# Patient Record
Sex: Male | Born: 1965 | Race: White | Hispanic: No | Marital: Married | Smoking: Never smoker
Health system: Southern US, Community
[De-identification: ages and names within clinical notes are randomized; demographics above are authoritative.]

## PROBLEM LIST (undated history)

## (undated) DIAGNOSIS — C8590 Non-Hodgkin lymphoma, unspecified, unspecified site: Secondary | ICD-10-CM

## (undated) DIAGNOSIS — M899 Disorder of bone, unspecified: Secondary | ICD-10-CM

## (undated) DIAGNOSIS — C801 Malignant (primary) neoplasm, unspecified: Secondary | ICD-10-CM

## (undated) DIAGNOSIS — C8190 Hodgkin lymphoma, unspecified, unspecified site: Secondary | ICD-10-CM

## (undated) DIAGNOSIS — Z973 Presence of spectacles and contact lenses: Secondary | ICD-10-CM

## (undated) DIAGNOSIS — E785 Hyperlipidemia, unspecified: Secondary | ICD-10-CM

## (undated) DIAGNOSIS — K219 Gastro-esophageal reflux disease without esophagitis: Secondary | ICD-10-CM

## (undated) DIAGNOSIS — E7800 Pure hypercholesterolemia, unspecified: Secondary | ICD-10-CM

## (undated) DIAGNOSIS — K469 Unspecified abdominal hernia without obstruction or gangrene: Secondary | ICD-10-CM

## (undated) DIAGNOSIS — M4306 Spondylolysis, lumbar region: Secondary | ICD-10-CM

## (undated) HISTORY — DX: Hyperlipidemia, unspecified: E78.5

## (undated) HISTORY — DX: Pure hypercholesterolemia, unspecified: E78.00

## (undated) HISTORY — DX: Disorder of bone, unspecified: M89.9

## (undated) HISTORY — PX: CLOSED REDUCTION HAND FRACTURE: SHX973

## (undated) HISTORY — DX: Unspecified abdominal hernia without obstruction or gangrene: K46.9

## (undated) HISTORY — DX: Gastro-esophageal reflux disease without esophagitis: K21.9

## (undated) HISTORY — PX: FINGER FRACTURE SURGERY: SHX638

## (undated) HISTORY — DX: Hodgkin lymphoma, unspecified, unspecified site: C81.90

## (undated) HISTORY — DX: Spondylolysis, lumbar region: M43.06

## (undated) HISTORY — DX: Malignant (primary) neoplasm, unspecified: C80.1

---

## 1998-04-16 ENCOUNTER — Encounter: Admission: RE | Admit: 1998-04-16 | Discharge: 1998-07-15 | Payer: Self-pay | Admitting: Internal Medicine

## 2011-04-17 ENCOUNTER — Ambulatory Visit (INDEPENDENT_AMBULATORY_CARE_PROVIDER_SITE_OTHER): Payer: 59 | Admitting: General Surgery

## 2011-04-17 ENCOUNTER — Encounter (INDEPENDENT_AMBULATORY_CARE_PROVIDER_SITE_OTHER): Payer: Self-pay | Admitting: General Surgery

## 2011-04-17 VITALS — BP 120/74 | HR 60 | Temp 96.4°F | Ht 70.0 in | Wt 205.2 lb

## 2011-04-17 DIAGNOSIS — K4090 Unilateral inguinal hernia, without obstruction or gangrene, not specified as recurrent: Secondary | ICD-10-CM

## 2011-04-17 NOTE — Progress Notes (Signed)
Subjective:     Patient ID: Keith Forbes, male   DOB: August 04, 1966, 45 y.o.   MRN: [id number]  HPI Comments: Mr. Rote is a 45 year old male referred by Dr.  Modesto Charon because of a right inguinal hernia. This has been present for one to 2 years. It is basically asymptomatic. He does heavy exercise without any difficulty. No difficulty with urination or bowel movements. He is here to discuss possible repair.    Review of Systems General:  _X_Normal __Fever__Weight change __Night sweats __Fatigue __Sleep loss  Breast:  _X_Normal __Lump __Pain __Nipple discharge __Infection __Other  Infectious Diseases:  _X_Normal __HIV/AIDS __Tuberculosis __Hepatitis A       __ Hepatitis B __Hepatitis C __ STD __MRSA(staph infection) __Other  Dental:  _X_Normal __Dentures __Other  Cardiac  :_X_ Normal __Pacemaker __Defibrillator __Bypass surgery __High blood pressure __ Peripheral vascular disease __Heart attack __Irregular heart beat __Valve       disease  Pulmonary:  _X_Normal __Cough/Sputum __Bronchitis __Asthma __COPD                    __Short of breath __Pneumonia __Sleep Apnea  Endocrine:  _X_Normal __Diabetes __Thyroid disease __High Cholesterol __Other  Skin:  _X_Normal  __Rash __Bruise easily __Cancer __Abnormal moles __Other  Gastrointestinal:  _X_Normal __Nausea/Vomiting/Diarrhea __Colon polyp or Cancer       __Irritable Bowel disease __Poor appetite __Hiatal hernia or reflux __Ulcer       __Liver disease __Abdominal pain __Hernia __Rectal bleeding or hemorrhoids       __Other  Genitourinary:  _X_Normal __Kidney disease or stones __Prostate problems        __Blood in urine __Difficulty voiding __Incontinence/leakage __Other  Neurological:  _X_Normal __Stroke __Paralysis __Seizure __Alzheimers disease       __Headaches __Dizziness __Fainting __Weakness __Other  Hematologic/Lymphatic:  _X_Normal __Bleeding disorder __Blood clots __Anemia       __Swollen lymph nodes __Blood Transfusion  __Other  Immune System:  _X_Normal __Previous/Current Cancer-type:  __Other  HEENT:  _X_Normal __Hearing loss  __Hearing aid __Ear infection __Nose bleed       __Hoarseness __Sore throat __Blurred or double vision __Glasses or contacts       __Glaucoma __Retinopathy __Macular degeneration __Other  Musculoskeletal:  _X_Normal __Joint pain __Arthritis __Other           Objective:   Physical Exam  Constitutional: He appears well-developed and well-nourished. No distress.  Abdominal: Soft. Bowel sounds are normal. He exhibits no distension and no mass. There is no tenderness.  Genitourinary: Penis normal.       Small right inguinal bulge.  No left inguinal bulge.  Testicles normal.  Musculoskeletal: Normal range of motion. He exhibits no edema.       Assessment:     Small right inguinal hernia that is asymptomatic.    Plan:     I told him and eventually he will need to have this repaired and I did not think it needed to be repaired acutely. If it becomes larger or he started having symptoms he should come back and we will schedule repair. He seems to understand all this.

## 2011-04-17 NOTE — Patient Instructions (Signed)
If the hernia becomes more painful or it gets larger call back and arrange to come in and talk about repair.

## 2011-05-01 ENCOUNTER — Encounter: Payer: Self-pay | Admitting: Family Medicine

## 2011-05-01 DIAGNOSIS — E7800 Pure hypercholesterolemia, unspecified: Secondary | ICD-10-CM | POA: Insufficient documentation

## 2012-08-12 ENCOUNTER — Other Ambulatory Visit: Payer: Self-pay | Admitting: Family Medicine

## 2012-08-12 ENCOUNTER — Ambulatory Visit
Admission: RE | Admit: 2012-08-12 | Discharge: 2012-08-12 | Disposition: A | Discharge status: Home / Self Care | Payer: 59 | Source: Ambulatory Visit | Attending: Family Medicine | Admitting: Family Medicine

## 2012-08-12 DIAGNOSIS — M545 Low back pain, unspecified: Secondary | ICD-10-CM

## 2013-01-28 ENCOUNTER — Other Ambulatory Visit: Payer: Self-pay | Admitting: Family Medicine

## 2013-01-28 NOTE — Telephone Encounter (Signed)
Medication refilled per protocol.Patient needs to be seen before any further refills 

## 2013-02-04 ENCOUNTER — Ambulatory Visit (INDEPENDENT_AMBULATORY_CARE_PROVIDER_SITE_OTHER): Payer: 59 | Admitting: Family Medicine

## 2013-02-04 ENCOUNTER — Encounter: Payer: Self-pay | Admitting: Family Medicine

## 2013-02-04 VITALS — BP 120/78 | HR 76 | Temp 98.7°F | Resp 16 | Ht 70.0 in | Wt 208.0 lb

## 2013-02-04 DIAGNOSIS — M5432 Sciatica, left side: Secondary | ICD-10-CM

## 2013-02-04 DIAGNOSIS — M545 Low back pain, unspecified: Secondary | ICD-10-CM

## 2013-02-04 DIAGNOSIS — M7062 Trochanteric bursitis, left hip: Secondary | ICD-10-CM

## 2013-02-04 DIAGNOSIS — M5430 Sciatica, unspecified side: Secondary | ICD-10-CM

## 2013-02-04 DIAGNOSIS — M76899 Other specified enthesopathies of unspecified lower limb, excluding foot: Secondary | ICD-10-CM

## 2013-02-04 NOTE — Progress Notes (Signed)
Subjective:    Patient ID: Keith Forbes, male    DOB: 03-04-1966, 47 y.o.   MRN: [id number]  HPI  Patient reports 6 weeks of pain in his left hip. The pain is located over the greater trochanteric bursa. Her sling on his hip at night. Aches and throbs. He is tender to the touch of the lateral aspect of the superior hip.  He denies specific injury. He gradually began after he was swimming.    However he also continues to have pain in his lower back. X-ray obtained in November 2013 revealed bilateral pars defects at L5. Also has some slightly diminished disc space at L5-S1.  However over the last 6 months, he has daily neuropathic-like pain radiating down the lateral aspect of his left leg into his anterior left she had. He denies weakness in the legs. Denies numbness. He does report pins and needles dysesthesias of the skin.   Past Medical History  Diagnosis Date  . Hernia   . Hyperlipidemia   . Hypercholesteremia    Current Outpatient Prescriptions on File Prior to Visit  Medication Sig Dispense Refill  . Cyanocobalamin (B-12 PO) Take by mouth daily.        . Multiple Vitamin (MULTIVITAMIN PO) Take by mouth daily.        . Omega-3 Fatty Acids (FISH OIL PO) Take by mouth 3 (three) times daily.        . pravastatin (PRAVACHOL) 20 MG tablet TAKE 1 TABLET BY MOUTH AT BEDTIME  30 tablet  1   No current facility-administered medications on file prior to visit.   No Known Allergies History   Social History  . Marital Status: Married    Spouse Name: N/A    Number of Children: N/A  . Years of Education: N/A   Occupational History  . Not on file.   Social History Main Topics  . Smoking status: Never Smoker   . Smokeless tobacco: Not on file  . Alcohol Use: Yes  . Drug Use: No  . Sexually Active:    Other Topics Concern  . Not on file   Social History Narrative  . No narrative on file     Review of Systems  All other systems reviewed and are negative.       Objective:    Physical Exam  Vitals reviewed. Cardiovascular: Normal rate, regular rhythm and normal heart sounds.   No murmur heard. Pulmonary/Chest: Effort normal and breath sounds normal.  Musculoskeletal:       Left hip: He exhibits tenderness (over greater trocanter) and bony tenderness. He exhibits normal range of motion, normal strength, no crepitus and no deformity.   muscle strength is 5 over 5 equal and symmetric in both legs. He has normal reflexes two over four at the knee and at the ankle. He has negative straight leg raise.         Assessment & Plan:  1. Greater trochanteric bursitis of left hip Using sterile technique after obtaining informed consent, the greater trochanteric bursa was injected with a mixture of 1 cc of 40 mg per mL Kenalog and 1 cc of 0.1% lidocaine without epinephrine. Wound care was discussed and the patient will follow up in 2 weeks if no better here if worse the  2. Low back pain Likely due to the bilateral pars defects. Obtain MRI to evaluate for the cause of his sciatica. - MR Lumbar Spine Wo Contrast; Future  3. Left sided sciatica Check MRI. Patient  is interested in epidural steroid injections if there is a cause for his sciatica revealed. - MR Lumbar Spine Wo Contrast; Future

## 2013-02-17 ENCOUNTER — Ambulatory Visit
Admission: RE | Admit: 2013-02-17 | Discharge: 2013-02-17 | Disposition: A | Discharge status: Home / Self Care | Payer: 59 | Source: Ambulatory Visit | Attending: Family Medicine | Admitting: Family Medicine

## 2013-02-17 ENCOUNTER — Other Ambulatory Visit: Payer: Self-pay | Admitting: Family Medicine

## 2013-02-17 DIAGNOSIS — Z9889 Other specified postprocedural states: Secondary | ICD-10-CM

## 2013-02-17 DIAGNOSIS — M545 Low back pain, unspecified: Secondary | ICD-10-CM

## 2013-02-17 DIAGNOSIS — M5432 Sciatica, left side: Secondary | ICD-10-CM

## 2013-02-18 ENCOUNTER — Other Ambulatory Visit: Payer: Self-pay | Admitting: Family Medicine

## 2013-02-18 DIAGNOSIS — M4306 Spondylolysis, lumbar region: Secondary | ICD-10-CM

## 2013-03-27 ENCOUNTER — Ambulatory Visit (INDEPENDENT_AMBULATORY_CARE_PROVIDER_SITE_OTHER): Payer: 59 | Admitting: Family Medicine

## 2013-03-27 ENCOUNTER — Encounter: Payer: Self-pay | Admitting: Family Medicine

## 2013-03-27 VITALS — BP 120/70 | HR 74 | Temp 99.4°F | Resp 16 | Wt 204.0 lb

## 2013-03-27 DIAGNOSIS — M4306 Spondylolysis, lumbar region: Secondary | ICD-10-CM

## 2013-03-27 DIAGNOSIS — M5430 Sciatica, unspecified side: Secondary | ICD-10-CM

## 2013-03-27 DIAGNOSIS — M4310 Spondylolisthesis, site unspecified: Secondary | ICD-10-CM

## 2013-03-27 DIAGNOSIS — M5432 Sciatica, left side: Secondary | ICD-10-CM

## 2013-03-27 MED ORDER — CELECOXIB 200 MG PO CAPS: 200.0000 mg | ORAL_CAPSULE | Freq: Every day | ORAL | Status: DC

## 2013-03-27 NOTE — Progress Notes (Signed)
  Subjective:    Patient ID: Keith Forbes, male    DOB: 1966/02/03, 47 y.o.   MRN: [id number]  HPI Patient in dealing with low back pain off and on for about a year. We did obtain an MRI of lumbar spine on 02/17/2013 which revealed bilateral pars defects at L5 causing a 2 mm anterior listhesis of L5 on S1. This cause bilateral foraminal encroachment left greater than right. The patient continues to have low back pain with left neuropathic pain radiating into his left hip down the posterior aspect of his left leg and into his left thigh. It is a daily problem. It did improve with prednisone. He is taking ibuprofen on a daily basis with minimal relief. He is tried physical therapy and I see no benefit from physical therapy. At this point he is interested in more aggressive options. Past Medical History  Diagnosis Date  . Hernia   . Hyperlipidemia   . Hypercholesteremia    Current Outpatient Prescriptions on File Prior to Visit  Medication Sig Dispense Refill  . Cyanocobalamin (B-12 PO) Take by mouth daily.        . Multiple Vitamin (MULTIVITAMIN PO) Take by mouth daily.        . Omega-3 Fatty Acids (FISH OIL PO) Take by mouth 3 (three) times daily.        . pravastatin (PRAVACHOL) 20 MG tablet TAKE 1 TABLET BY MOUTH AT BEDTIME  30 tablet  1   No current facility-administered medications on file prior to visit.   No Known Allergies History   Social History  . Marital Status: Married    Spouse Name: N/A    Number of Children: N/A  . Years of Education: N/A   Occupational History  . Not on file.   Social History Main Topics  . Smoking status: Never Smoker   . Smokeless tobacco: Not on file  . Alcohol Use: Yes  . Drug Use: No  . Sexually Active:    Other Topics Concern  . Not on file   Social History Narrative  . No narrative on file   Family History  Problem Relation Age of Onset  . Hyperlipidemia Father       Review of Systems  All other systems reviewed and are  negative.       Objective:   Physical Exam  Vitals reviewed. Cardiovascular: Normal rate and regular rhythm.   Pulmonary/Chest: Effort normal and breath sounds normal.  majority of office visit was spent in discussion.        Assessment & Plan:  1. Pars defect of lumbar spine - Ambulatory referral to Neurosurgery  2. Left sided sciatica - Ambulatory referral to Neurosurgery We have tried and exhausted conservative options. The patient like to speak back surgery regarding other possible treatments for his low back pain and sciatica. In the meantime he's going to try Celebrex 200 mg by mouth daily for relief.  Consult neurosurgery.  We'll defer treatment of his greater trochanteric bursitis until after evaluation of his back.

## 2013-05-06 ENCOUNTER — Encounter: Payer: Self-pay | Admitting: Family Medicine

## 2013-05-06 ENCOUNTER — Other Ambulatory Visit: Payer: Self-pay | Admitting: Family Medicine

## 2013-05-06 NOTE — Telephone Encounter (Signed)
Medication refill for one time only.  Patient needs to be seen.  Letter sent for patient to call and schedule 

## 2013-06-09 ENCOUNTER — Other Ambulatory Visit: Payer: Self-pay | Admitting: Family Medicine

## 2013-09-28 ENCOUNTER — Other Ambulatory Visit: Payer: Self-pay | Admitting: Family Medicine

## 2013-11-17 ENCOUNTER — Encounter: Payer: Self-pay | Admitting: Family Medicine

## 2013-11-17 ENCOUNTER — Ambulatory Visit (INDEPENDENT_AMBULATORY_CARE_PROVIDER_SITE_OTHER): Payer: 59 | Admitting: Family Medicine

## 2013-11-17 VITALS — BP 120/70 | HR 64 | Temp 97.1°F | Resp 14 | Ht 70.0 in | Wt 202.0 lb

## 2013-11-17 DIAGNOSIS — R053 Chronic cough: Secondary | ICD-10-CM

## 2013-11-17 DIAGNOSIS — R059 Cough, unspecified: Secondary | ICD-10-CM

## 2013-11-17 DIAGNOSIS — R05 Cough: Secondary | ICD-10-CM

## 2013-11-17 MED ORDER — OMEPRAZOLE 40 MG PO CPDR
40.0000 mg | DELAYED_RELEASE_CAPSULE | Freq: Every day | ORAL | Status: DC
Start: 2013-11-17 — End: 2014-09-10

## 2013-11-17 NOTE — Progress Notes (Signed)
   Subjective:    Patient ID: Keith Forbes, male    DOB: 12/05/1965, 48 y.o.   MRN: [id number]  HPI Patient has had a chronic nonproductive cough for the last 4 months. It is becoming go. He denies any fever, night sweats, chills, weight loss, or hemoptysis. He denies any chest pain or shortness of breath. It does look after lunch or after dinner and night. It is more of an irritant cough. Whenever he takes a deep breath in he was delayed in the back of his throat that triggers the cough. Otherwise he is feeling well. He denies any postnasal drip or allergies. He denies any acid reflux. Past Medical History  Diagnosis Date  . Hernia   . Hyperlipidemia   . Hypercholesteremia    Current Outpatient Prescriptions on File Prior to Visit  Medication Sig Dispense Refill  . CELEBREX 200 MG capsule TAKE 1 CAPSULE (200 MG TOTAL) BY MOUTH DAILY.  30 capsule  5  . Cyanocobalamin (B-12 PO) Take by mouth daily.        . Multiple Vitamin (MULTIVITAMIN PO) Take by mouth daily.        . Omega-3 Fatty Acids (FISH OIL PO) Take by mouth 3 (three) times daily.        . pravastatin (PRAVACHOL) 20 MG tablet TAKE 1 TABLET BY MOUTH AT BEDTIME  30 tablet  5   No current facility-administered medications on file prior to visit.   No Known Allergies History   Social History  . Marital Status: Married    Spouse Name: N/A    Number of Children: N/A  . Years of Education: N/A   Occupational History  . Not on file.   Social History Main Topics  . Smoking status: Never Smoker   . Smokeless tobacco: Not on file  . Alcohol Use: Yes  . Drug Use: No  . Sexual Activity:    Other Topics Concern  . Not on file   Social History Narrative  . No narrative on file      Review of Systems  All other systems reviewed and are negative.       Objective:   Physical Exam  Vitals reviewed. Constitutional: He appears well-developed and well-nourished.  HENT:  Nose: Nose normal.  Mouth/Throat: Oropharynx is  clear and moist. No oropharyngeal exudate.  Eyes: Conjunctivae are normal. Pupils are equal, round, and reactive to light.  Neck: Neck supple. No JVD present. No thyromegaly present.  Cardiovascular: Normal rate, regular rhythm and normal heart sounds.   No murmur heard. Pulmonary/Chest: Effort normal and breath sounds normal. No respiratory distress. He has no wheezes. He has no rales. He exhibits no tenderness.  Abdominal: Soft. Bowel sounds are normal. He exhibits no distension. There is no tenderness. There is no rebound.  Lymphadenopathy:    He has no cervical adenopathy.          Assessment & Plan:  1. 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 Begin omeprazole 40 mg by mouth daily and recheck in 3 weeks. If cough persist I would obtain a chest x-ray and check lab work to evaluate for whooping cough as sarcoidosis. - omeprazole (PRILOSEC) 40 MG capsule; Take 1 capsule (40 mg total) by mouth daily.  Dispense: 30 capsule; Refill: 3

## 2013-12-17 ENCOUNTER — Other Ambulatory Visit: Payer: Self-pay | Admitting: Family Medicine

## 2013-12-17 DIAGNOSIS — E785 Hyperlipidemia, unspecified: Secondary | ICD-10-CM

## 2013-12-17 DIAGNOSIS — Z79899 Other long term (current) drug therapy: Secondary | ICD-10-CM

## 2013-12-17 DIAGNOSIS — Z0000 Encounter for general adult medical examination without abnormal findings: Secondary | ICD-10-CM

## 2013-12-19 LAB — CBC WITH DIFFERENTIAL/PLATELET
BASOS ABS: 0.1 10*3/uL (ref 0.0–0.1)
Basophils Relative: 1 % (ref 0–1)
Eosinophils Absolute: 0.3 10*3/uL (ref 0.0–0.7)
Eosinophils Relative: 2 % (ref 0–5)
HEMATOCRIT: 41.1 % (ref 39.0–52.0)
Hemoglobin: 13.9 g/dL (ref 13.0–17.0)
LYMPHS PCT: 19 % (ref 12–46)
Lymphs Abs: 2.5 10*3/uL (ref 0.7–4.0)
MCH: 29 pg (ref 26.0–34.0)
MCHC: 33.8 g/dL (ref 30.0–36.0)
MCV: 85.6 fL (ref 78.0–100.0)
Monocytes Absolute: 0.8 10*3/uL (ref 0.1–1.0)
Monocytes Relative: 6 % (ref 3–12)
NEUTROS ABS: 9.3 10*3/uL — AB (ref 1.7–7.7)
Neutrophils Relative %: 72 % (ref 43–77)
PLATELETS: 278 10*3/uL (ref 150–400)
RBC: 4.8 MIL/uL (ref 4.22–5.81)
RDW: 13.1 % (ref 11.5–15.5)
WBC: 12.9 10*3/uL — AB (ref 4.0–10.5)

## 2013-12-19 LAB — COMPREHENSIVE METABOLIC PANEL
ALBUMIN: 4 g/dL (ref 3.5–5.2)
ALT: 34 U/L (ref 0–53)
AST: 35 U/L (ref 0–37)
Alkaline Phosphatase: 101 U/L (ref 39–117)
BUN: 14 mg/dL (ref 6–23)
CHLORIDE: 104 meq/L (ref 96–112)
CO2: 29 meq/L (ref 19–32)
Calcium: 10.3 mg/dL (ref 8.4–10.5)
Creat: 0.96 mg/dL (ref 0.50–1.35)
Glucose, Bld: 86 mg/dL (ref 70–99)
POTASSIUM: 4.9 meq/L (ref 3.5–5.3)
Sodium: 139 mEq/L (ref 135–145)
Total Bilirubin: 0.5 mg/dL (ref 0.2–1.2)
Total Protein: 7.3 g/dL (ref 6.0–8.3)

## 2013-12-19 LAB — TSH: TSH: 0.248 u[IU]/mL — ABNORMAL LOW (ref 0.350–4.500)

## 2013-12-19 LAB — LIPID PANEL
CHOLESTEROL: 193 mg/dL (ref 0–200)
HDL: 37 mg/dL — AB (ref >39)
LDL CALC: 142 mg/dL — AB (ref 0–99)
Total CHOL/HDL Ratio: 5.2 Ratio
Triglycerides: 69 mg/dL (ref <150)
VLDL: 14 mg/dL (ref 0–40)

## 2013-12-22 ENCOUNTER — Ambulatory Visit (INDEPENDENT_AMBULATORY_CARE_PROVIDER_SITE_OTHER): Payer: 59 | Admitting: Family Medicine

## 2013-12-22 ENCOUNTER — Encounter: Payer: Self-pay | Admitting: Family Medicine

## 2013-12-22 VITALS — BP 120/60 | HR 60 | Temp 98.4°F | Resp 14 | Ht 70.0 in

## 2013-12-22 DIAGNOSIS — R946 Abnormal results of thyroid function studies: Secondary | ICD-10-CM

## 2013-12-22 DIAGNOSIS — R7989 Other specified abnormal findings of blood chemistry: Secondary | ICD-10-CM

## 2013-12-22 DIAGNOSIS — R059 Cough, unspecified: Secondary | ICD-10-CM

## 2013-12-22 DIAGNOSIS — R053 Chronic cough: Secondary | ICD-10-CM

## 2013-12-22 DIAGNOSIS — R05 Cough: Secondary | ICD-10-CM

## 2013-12-22 DIAGNOSIS — Z0000 Encounter for general adult medical examination without abnormal findings: Secondary | ICD-10-CM

## 2013-12-22 DIAGNOSIS — M4306 Spondylolysis, lumbar region: Secondary | ICD-10-CM | POA: Insufficient documentation

## 2013-12-22 DIAGNOSIS — D72829 Elevated white blood cell count, unspecified: Secondary | ICD-10-CM

## 2013-12-22 NOTE — Progress Notes (Signed)
Subjective:    Patient ID: Keith Forbes, male    DOB: [birth year], 48 y.o.   MRN: [id number]  HPI I last saw the patient 11/17/13.  At that time he reported a chronic nonproductive cough for the last 4 months. It is becoming go. He denies any fever, night sweats, chills, weight loss, or hemoptysis. He denies any chest pain or shortness of breath. It does look after lunch or after dinner and night. It is more of an irritant cough. Whenever he takes a deep breath in he was delayed in the back of his throat that triggers the cough. Otherwise he is feeling well. He denies any postnasal drip or allergies. He denies any acid reflux.  My plan was: 1. 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 Begin omeprazole 40 mg by mouth daily and recheck in 3 weeks. If cough persist I would obtain a chest x-ray and check lab work to evaluate for whooping cough as sarcoidosis. - omeprazole (PRILOSEC) 40 MG capsule; Take 1 capsule (40 mg total) by mouth daily.  Dispense: 30 capsule; Refill: 3  Patient's cough improved and almost subsided however he then got sick and developed a flulike illness associated with fever. He continues to have an occasional cough today although the fever has subsided. That may explain why his white blood cell count is elevated on his fasting lab work which is listed below. The patient's LDL cholesterol is slightly elevated the patient admits that he only takes pravastatin 3-4 days out of 7. Orders Only on 12/17/2013  Component Date Value Ref Range Status  . WBC 12/18/2013 12.9* 4.0 - 10.5 K/uL Final  . RBC 12/18/2013 4.80  4.22 - 5.81 MIL/uL Final  . Hemoglobin 12/18/2013 13.9  13.0 - 17.0 g/dL Final  . HCT 12/18/2013 41.1  39.0 - 52.0 % Final  . MCV 12/18/2013 85.6  78.0 - 100.0 fL Final  . MCH 12/18/2013 29.0  26.0 - 34.0 pg Final  . MCHC 12/18/2013 33.8  30.0 - 36.0 g/dL Final  . RDW 12/18/2013 13.1  11.5 - 15.5 % Final  . Platelets 12/18/2013 278  150 - 400  K/uL Final  . Neutrophils Relative % 12/18/2013 72  43 - 77 % Final  . Neutro Abs 12/18/2013 9.3* 1.7 - 7.7 K/uL Final  . Lymphocytes Relative 12/18/2013 19  12 - 46 % Final  . Lymphs Abs 12/18/2013 2.5  0.7 - 4.0 K/uL Final  . Monocytes Relative 12/18/2013 6  3 - 12 % Final  . Monocytes Absolute 12/18/2013 0.8  0.1 - 1.0 K/uL Final  . Eosinophils Relative 12/18/2013 2  0 - 5 % Final  . Eosinophils Absolute 12/18/2013 0.3  0.0 - 0.7 K/uL Final  . Basophils Relative 12/18/2013 1  0 - 1 % Final  . Basophils Absolute 12/18/2013 0.1  0.0 - 0.1 K/uL Final  . Smear Review 12/18/2013 Criteria for review not met   Final  . Cholesterol 12/18/2013 193  0 - 200 mg/dL Final   Comment: ATP III Classification:                                < 200        mg/dL        Desirable  200 - 239     mg/dL        Borderline High                               >= 240        mg/dL        High                             . Triglycerides 12/18/2013 69  <150 mg/dL Final  . HDL 12/18/2013 37* >39 mg/dL Final  . Total CHOL/HDL Ratio 12/18/2013 5.2   Final  . VLDL 12/18/2013 14  0 - 40 mg/dL Final  . LDL Cholesterol 12/18/2013 142* 0 - 99 mg/dL Final   Comment:                            Total Cholesterol/HDL Ratio:CHD Risk                                                 Coronary Heart Disease Risk Table                                                                 Men       Women                                   1/2 Average Risk              3.4        3.3                                       Average Risk              5.0        4.4                                    2X Average Risk              9.6        7.1                                    3X Average Risk             23.4       11.0                          Use the calculated Patient Ratio above and the CHD Risk table  to determine the patient's CHD Risk.                          ATP III Classification  (LDL):                                < 100        mg/dL         Optimal                               100 - 129     mg/dL         Near or Above Optimal                               130 - 159     mg/dL         Borderline High                               160 - 189     mg/dL         High                                > 190        mg/dL         Very High                             . TSH 12/18/2013 0.248* 0.350 - 4.500 uIU/mL Final  . Sodium 12/18/2013 139  135 - 145 mEq/L Final  . Potassium 12/18/2013 4.9  3.5 - 5.3 mEq/L Final  . Chloride 12/18/2013 104  96 - 112 mEq/L Final  . CO2 12/18/2013 29  19 - 32 mEq/L Final  . Glucose, Bld 12/18/2013 86  70 - 99 mg/dL Final  . BUN 12/18/2013 14  6 - 23 mg/dL Final  . Creat 12/18/2013 0.96  0.50 - 1.35 mg/dL Final  . Total Bilirubin 12/18/2013 0.5  0.2 - 1.2 mg/dL Final  . Alkaline Phosphatase 12/18/2013 101  39 - 117 U/L Final  . AST 12/18/2013 35  0 - 37 U/L Final  . ALT 12/18/2013 34  0 - 53 U/L Final  . Total Protein 12/18/2013 7.3  6.0 - 8.3 g/dL Final  . Albumin 12/18/2013 4.0  3.5 - 5.2 g/dL Final  . Calcium 12/18/2013 10.3  8.4 - 10.5 mg/dL Final   Past Medical History  Diagnosis Date  . Hernia   . Hyperlipidemia   . Hypercholesteremia   . Pars defect of lumbar spine     bilateral 2013 (Dr. Sherwood Gambler)   No past surgical history on file. Current Outpatient Prescriptions on File Prior to Visit  Medication Sig Dispense Refill  . aspirin 81 MG tablet Take 81 mg by mouth daily.      . CELEBREX 200 MG capsule TAKE 1 CAPSULE (200 MG TOTAL) BY MOUTH DAILY.  30 capsule  5  . Cyanocobalamin (B-12 PO) Take by mouth daily.        . Multiple Vitamin (MULTIVITAMIN PO) Take by mouth daily.        Marland Kitchen  Omega-3 Fatty Acids (FISH OIL PO) Take by mouth 3 (three) times daily.        Marland Kitchen omeprazole (PRILOSEC) 40 MG capsule Take 1 capsule (40 mg total) by mouth daily.  30 capsule  3  . pravastatin (PRAVACHOL) 20 MG tablet TAKE 1 TABLET BY MOUTH AT  BEDTIME  30 tablet  5   No current facility-administered medications on file prior to visit.   No Known Allergies_0 History   Social History  . Marital Status: Married    Spouse Name: N/A    Number of Children: N/A  . Years of Education: N/A   Occupational History  . Not on file.   Social History Main Topics  . Smoking status: Never Smoker   . Smokeless tobacco: Never Used  . Alcohol Use: Yes  . Drug Use: No  . Sexual Activity: Not on file     Comment: married, works in emergency services   Other Topics Concern  . Not on file   Social History Narrative  . No narrative on file   Family History  Problem Relation Age of Onset  . Hyperlipidemia Father   . Heart disease Father        Review of Systems  All other systems reviewed and are negative.       Objective:   Physical Exam  Vitals reviewed. Constitutional: He is oriented to person, place, and time. He appears well-developed and well-nourished. No distress.  HENT:  Head: Normocephalic and atraumatic.  Right Ear: External ear normal.  Left Ear: External ear normal.  Nose: Nose normal.  Mouth/Throat: Oropharynx is clear and moist. No oropharyngeal exudate.  Eyes: Conjunctivae and EOM are normal. Pupils are equal, round, and reactive to light. Right eye exhibits no discharge. Left eye exhibits no discharge. No scleral icterus.  Neck: Normal range of motion. Neck supple. No JVD present. No tracheal deviation present. No thyromegaly present.  Cardiovascular: Normal rate, regular rhythm, normal heart sounds and intact distal pulses.  Exam reveals no gallop and no friction rub.   No murmur heard. Pulmonary/Chest: Effort normal and breath sounds normal. No stridor. No respiratory distress. He has no wheezes. He has no rales. He exhibits no tenderness.  Abdominal: Soft. Bowel sounds are normal. He exhibits no distension and no mass. There is no tenderness. There is no rebound and no guarding.  Genitourinary: Penis  normal.  Musculoskeletal: Normal range of motion. He exhibits no edema and no tenderness.  Lymphadenopathy:    He has no cervical adenopathy.  Neurological: He is alert and oriented to person, place, and time. He has normal reflexes. He displays normal reflexes. No cranial nerve deficit. He exhibits normal muscle tone. Coordination normal.  Skin: Skin is warm. No rash noted. He is not diaphoretic. No erythema. No pallor.  Psychiatric: He has a normal mood and affect. His behavior is normal. Judgment and thought content normal.          Assessment & Plan:  1. Chronic cough I still believe the patient has laryngo-esophageal reflux. Discontinue Prilosec and switch to dexilant 60 mg poqday. The patient chest x-ray. Recheck in 2-3 weeks - DG Chest 2 View; Future  2. Routine general medical examination at a health care facility Patient's physical exam is completely normal. I recommended compliance with pravastatin. I believe that would drop his LDL below 130 as it is already dropped over 40 points taking it 50-60% of the time.  3. Low TSH level Admitting would stay due to his  illness. I recommended that he return in 2 weeks to allow [place] to recheck a TSH when he is not ill. - TSH; Future - T4, free; Future - T3, Free; Future  4. Elevated WBC count Recheck CBC when he is not ill - CBC with Differential; Future

## 2013-12-24 ENCOUNTER — Ambulatory Visit
Admission: RE | Admit: 2013-12-24 | Discharge: 2013-12-24 | Disposition: A | Discharge status: Home / Self Care | Payer: 59 | Source: Ambulatory Visit | Attending: Family Medicine | Admitting: Family Medicine

## 2013-12-24 ENCOUNTER — Other Ambulatory Visit: Payer: Self-pay | Admitting: Family Medicine

## 2013-12-24 DIAGNOSIS — R053 Chronic cough: Secondary | ICD-10-CM

## 2013-12-24 DIAGNOSIS — R05 Cough: Secondary | ICD-10-CM

## 2014-01-06 ENCOUNTER — Telehealth: Payer: Self-pay | Admitting: Family Medicine

## 2014-01-06 MED ORDER — DEXLANSOPRAZOLE 60 MG PO CPDR: 60.0000 mg | DELAYED_RELEASE_CAPSULE | Freq: Every day | ORAL | Status: DC

## 2014-01-06 NOTE — Telephone Encounter (Signed)
Med sent to pharm 

## 2014-01-06 NOTE — Telephone Encounter (Signed)
Patients wife cari calling to see if Keith Forbes can get rx for dexilant to be called into [facility]

## 2014-01-08 ENCOUNTER — Encounter: Payer: Self-pay | Admitting: *Deleted

## 2014-01-26 ENCOUNTER — Other Ambulatory Visit: Payer: 59

## 2014-01-26 DIAGNOSIS — R7989 Other specified abnormal findings of blood chemistry: Secondary | ICD-10-CM

## 2014-01-26 DIAGNOSIS — D72829 Elevated white blood cell count, unspecified: Secondary | ICD-10-CM

## 2014-01-26 LAB — T3, FREE: T3, Free: 2.8 pg/mL (ref 2.3–4.2)

## 2014-01-26 LAB — CBC WITH DIFFERENTIAL/PLATELET
BASOS ABS: 0.1 10*3/uL (ref 0.0–0.1)
BASOS PCT: 1 % (ref 0–1)
EOS PCT: 2 % (ref 0–5)
Eosinophils Absolute: 0.2 10*3/uL (ref 0.0–0.7)
HCT: 40.9 % (ref 39.0–52.0)
Hemoglobin: 13.6 g/dL (ref 13.0–17.0)
Lymphocytes Relative: 19 % (ref 12–46)
Lymphs Abs: 2.2 10*3/uL (ref 0.7–4.0)
MCH: 28.6 pg (ref 26.0–34.0)
MCHC: 33.3 g/dL (ref 30.0–36.0)
MCV: 86.1 fL (ref 78.0–100.0)
Monocytes Absolute: 0.6 10*3/uL (ref 0.1–1.0)
Monocytes Relative: 5 % (ref 3–12)
NEUTROS ABS: 8.5 10*3/uL — AB (ref 1.7–7.7)
Neutrophils Relative %: 73 % (ref 43–77)
Platelets: 280 10*3/uL (ref 150–400)
RBC: 4.75 MIL/uL (ref 4.22–5.81)
RDW: 13.1 % (ref 11.5–15.5)
WBC: 11.7 10*3/uL — AB (ref 4.0–10.5)

## 2014-01-26 LAB — TSH: TSH: 0.208 u[IU]/mL — ABNORMAL LOW (ref 0.350–4.500)

## 2014-01-26 LAB — T4, FREE: FREE T4: 1.17 ng/dL (ref 0.80–1.80)

## 2014-02-06 NOTE — Progress Notes (Signed)
LMTRC

## 2014-02-10 ENCOUNTER — Encounter: Payer: Self-pay | Admitting: Family Medicine

## 2014-03-23 ENCOUNTER — Other Ambulatory Visit: Payer: Self-pay | Admitting: Family Medicine

## 2014-03-26 ENCOUNTER — Other Ambulatory Visit: Payer: Self-pay | Admitting: Family Medicine

## 2014-03-26 NOTE — Telephone Encounter (Signed)
Refill appropriate and filled per protocol. 

## 2014-08-06 ENCOUNTER — Ambulatory Visit (INDEPENDENT_AMBULATORY_CARE_PROVIDER_SITE_OTHER): Payer: 59 | Admitting: Family Medicine

## 2014-08-06 ENCOUNTER — Encounter: Payer: Self-pay | Admitting: Family Medicine

## 2014-08-06 VITALS — BP 108/70 | HR 74 | Temp 98.6°F | Resp 18 | Ht 70.0 in | Wt 199.0 lb

## 2014-08-06 DIAGNOSIS — I889 Nonspecific lymphadenitis, unspecified: Secondary | ICD-10-CM

## 2014-08-06 LAB — URINALYSIS, ROUTINE W REFLEX MICROSCOPIC
BILIRUBIN URINE: NEGATIVE
Glucose, UA: NEGATIVE mg/dL
Hgb urine dipstick: NEGATIVE
Ketones, ur: NEGATIVE mg/dL
LEUKOCYTES UA: NEGATIVE
NITRITE: NEGATIVE
PROTEIN: NEGATIVE mg/dL
SPECIFIC GRAVITY, URINE: 1.01 (ref 1.005–1.030)
UROBILINOGEN UA: 0.2 mg/dL (ref 0.0–1.0)
pH: 5.5 (ref 5.0–8.0)

## 2014-08-06 MED ORDER — CIPROFLOXACIN HCL 500 MG PO TABS: 500.0000 mg | ORAL_TABLET | Freq: Two times a day (BID) | ORAL | Status: DC

## 2014-08-06 NOTE — Progress Notes (Signed)
Subjective:    Patient ID: Keith Forbes, male    DOB: 13-Jun-1966, 48 y.o.   MRN: [id number]  HPI Patient presents today with a swollen hard lymph node in the left inguinal canal. He noticed it today while taking a shower. He denies any recent skin infections on the legs. He denies any hematuria or dysuria. He denies any penile discharge. He denies any rashes on the penis or scrotum or legs. He denies any exposure to gonorrhea or chlamydia. He is in a stable monogamous relationship. There is no other lymphadenopathy in the inguinal canals on either side in his axilla, and anterior posterior cervical chains, or supraclavicular region. He does report night sweats but he denies any fevers or weight loss. He denies any bleeding or bruising. Lymph node is approximately 2 cm in size. Past Medical History  Diagnosis Date  . Hernia   . Hyperlipidemia   . Hypercholesteremia   . Pars defect of lumbar spine     bilateral 2013 (Dr. Sherwood Gambler)   No past surgical history on file. Current Outpatient Prescriptions on File Prior to Visit  Medication Sig Dispense Refill  . aspirin 81 MG tablet Take 81 mg by mouth daily.    . celecoxib (CELEBREX) 200 MG capsule TAKE 1 CAPSULE (200 MG TOTAL) BY MOUTH DAILY. 30 capsule 5  . Cyanocobalamin (B-12 PO) Take by mouth daily.      Marland Kitchen dexlansoprazole (DEXILANT) 60 MG capsule Take 1 capsule (60 mg total) by mouth daily. 30 capsule 11  . Multiple Vitamin (MULTIVITAMIN PO) Take by mouth daily.      . Omega-3 Fatty Acids (FISH OIL PO) Take by mouth 3 (three) times daily.      Marland Kitchen omeprazole (PRILOSEC) 40 MG capsule Take 1 capsule (40 mg total) by mouth daily. 30 capsule 3  . pravastatin (PRAVACHOL) 20 MG tablet TAKE 1 TABLET BY MOUTH AT BEDTIME 30 tablet 5   No current facility-administered medications on file prior to visit.   No Known Allergies History   Social History  . Marital Status: Married    Spouse Name: N/A    Number of Children: N/A  . Years of  Education: N/A   Occupational History  . Not on file.   Social History Main Topics  . Smoking status: Never Smoker   . Smokeless tobacco: Never Used  . Alcohol Use: Yes  . Drug Use: No  . Sexual Activity: Not on file     Comment: married, works in emergency services   Other Topics Concern  . Not on file   Social History Narrative      Review of Systems  All other systems reviewed and are negative.      Objective:   Physical Exam  Cardiovascular: Normal rate, regular rhythm and normal heart sounds.   Pulmonary/Chest: Effort normal and breath sounds normal. No respiratory distress. He has no wheezes. He has no rales.  Abdominal: Soft. Bowel sounds are normal.  Lymphadenopathy:       Head (right side): No submental, no submandibular, no tonsillar, no preauricular, no posterior auricular and no occipital adenopathy present.       Head (left side): No submental, no submandibular, no tonsillar, no preauricular, no posterior auricular and no occipital adenopathy present.    He has no cervical adenopathy.    He has no axillary adenopathy.       Right: No inguinal and no supraclavicular adenopathy present.       Left: Inguinal adenopathy  present. No supraclavicular adenopathy present.  Vitals reviewed.         Assessment & Plan:  Inguinal lymphadenitis - Plan: ciprofloxacin (CIPRO) 500 MG tablet, CBC with Differential, Urinalysis, Routine w reflex microscopic, US Pelvis Limited  Urinalysis today shows no infection of urinary tract infection. There is no apparent skin infection. The patient denies any exposure to sexually transmitted infections. I will try to treat the patient empirically with Cipro 500 mg by mouth twice a day for 10 days. I will also obtain an ultrasound of the lymph node to evaluate for any pathologic features. If the lymph node persists or if there are pathologic features found on the ultrasound, I would recommend a biopsy

## 2014-08-07 ENCOUNTER — Telehealth: Payer: Self-pay | Admitting: Family Medicine

## 2014-08-07 LAB — CBC WITH DIFFERENTIAL/PLATELET
BASOS ABS: 0 10*3/uL (ref 0.0–0.1)
BASOS PCT: 0 % (ref 0–1)
Eosinophils Absolute: 0.1 10*3/uL (ref 0.0–0.7)
Eosinophils Relative: 1 % (ref 0–5)
HEMATOCRIT: 35.5 % — AB (ref 39.0–52.0)
HEMOGLOBIN: 12 g/dL — AB (ref 13.0–17.0)
Lymphocytes Relative: 18 % (ref 12–46)
Lymphs Abs: 2.5 10*3/uL (ref 0.7–4.0)
MCH: 28 pg (ref 26.0–34.0)
MCHC: 33.8 g/dL (ref 30.0–36.0)
MCV: 82.8 fL (ref 78.0–100.0)
MONOS PCT: 7 % (ref 3–12)
Monocytes Absolute: 1 10*3/uL (ref 0.1–1.0)
NEUTROS PCT: 74 % (ref 43–77)
Neutro Abs: 10.3 10*3/uL — ABNORMAL HIGH (ref 1.7–7.7)
Platelets: 325 10*3/uL (ref 150–400)
RBC: 4.29 MIL/uL (ref 4.22–5.81)
RDW: 13.5 % (ref 11.5–15.5)
WBC: 13.9 10*3/uL — AB (ref 4.0–10.5)

## 2014-08-07 NOTE — Telephone Encounter (Signed)
Called pt back and informed him of his appt again

## 2014-08-07 NOTE — Telephone Encounter (Signed)
[phone]  Pt has called back wanting to speak to you

## 2014-08-10 ENCOUNTER — Ambulatory Visit
Admission: RE | Admit: 2014-08-10 | Discharge: 2014-08-10 | Disposition: A | Discharge status: Home / Self Care | Payer: 59 | Source: Ambulatory Visit | Attending: Family Medicine | Admitting: Family Medicine

## 2014-08-10 DIAGNOSIS — I889 Nonspecific lymphadenitis, unspecified: Secondary | ICD-10-CM

## 2014-08-11 ENCOUNTER — Telehealth: Payer: Self-pay | Admitting: Family Medicine

## 2014-08-11 NOTE — Telephone Encounter (Signed)
Patient aware of results via vm  

## 2014-08-11 NOTE — Telephone Encounter (Signed)
Patient is calling about the results of his ultrasound  [phone]

## 2014-08-18 ENCOUNTER — Telehealth: Payer: Self-pay | Admitting: Family Medicine

## 2014-08-18 DIAGNOSIS — I889 Nonspecific lymphadenitis, unspecified: Secondary | ICD-10-CM

## 2014-08-18 NOTE — Telephone Encounter (Signed)
Pt's wife called back and states that the knot is still there on this pt and would like to know what the next step is for this? Per [facility] note on ultrasound next step is general surgery for biopsy. Will put in referral.

## 2014-08-23 ENCOUNTER — Other Ambulatory Visit: Payer: Self-pay | Admitting: Family Medicine

## 2014-08-25 ENCOUNTER — Other Ambulatory Visit: Payer: Self-pay | Admitting: Family Medicine

## 2014-08-25 NOTE — Telephone Encounter (Signed)
Refill appropriate and filled per protocol. 

## 2014-09-01 HISTORY — PX: LYMPH NODE BIOPSY: SHX201

## 2014-09-04 ENCOUNTER — Other Ambulatory Visit (INDEPENDENT_AMBULATORY_CARE_PROVIDER_SITE_OTHER): Payer: Self-pay | Admitting: General Surgery

## 2014-09-04 ENCOUNTER — Ambulatory Visit: Payer: 59 | Admitting: Family Medicine

## 2014-09-09 ENCOUNTER — Telehealth (INDEPENDENT_AMBULATORY_CARE_PROVIDER_SITE_OTHER): Payer: Self-pay | Admitting: General Surgery

## 2014-09-09 NOTE — Telephone Encounter (Signed)
Lymph node histology discussed with Dr. Gari Crown.  Histology diagnostic for Hodgkin's  lymphoma.  Further testing is being done to subclassify it. I called Keith Forbes and discussed this with him.  I advised immediate referral to medical oncology.  He agrees with this and wants to do this as quickly as possible I advise referral to Dr. Rozetta Nunnery, who is specializing in Orchid and hematology. We will make that referral.   Edsel Petrin. Dalbert Batman, M.D., [facility] General and Minimally invasive Surgery Breast and Colorectal Surgery Office:   [phone] Pager:   [phone]

## 2014-09-10 ENCOUNTER — Encounter: Payer: Self-pay | Admitting: Family Medicine

## 2014-09-10 ENCOUNTER — Other Ambulatory Visit: Payer: Self-pay | Admitting: Family Medicine

## 2014-09-10 ENCOUNTER — Telehealth: Payer: Self-pay | Admitting: Hematology and Oncology

## 2014-09-10 ENCOUNTER — Ambulatory Visit (INDEPENDENT_AMBULATORY_CARE_PROVIDER_SITE_OTHER): Payer: 59 | Admitting: Family Medicine

## 2014-09-10 VITALS — BP 128/70 | HR 78 | Temp 98.9°F | Resp 12 | Ht 70.0 in | Wt 198.0 lb

## 2014-09-10 DIAGNOSIS — C8190 Hodgkin lymphoma, unspecified, unspecified site: Secondary | ICD-10-CM

## 2014-09-10 MED ORDER — CEPHALEXIN 500 MG PO CAPS: 500.0000 mg | ORAL_CAPSULE | Freq: Four times a day (QID) | ORAL | Status: DC

## 2014-09-10 NOTE — Telephone Encounter (Signed)
LEFT MESSAGE FOR PATIENT AND GAVE NP APPT FOR 12/11 @ 11 W/DR. [place] CONTACT INFORMATION LEFT FOR PATIENT TO RETURN CALL TO CONFIRM MESSAGE/NP APPT.

## 2014-09-10 NOTE — Progress Notes (Signed)
Subjective:    Patient ID: Keith Forbes, male    DOB: 02-27-1966, 48 y.o.   MRN: [id number]  HPI  Saw the patient originally for an enlarged lymph node in his left inguinal crease in November. We tried empiric antibiotics and the lymph node did not improve. Was subsequently referred to general surgery for an excisional biopsy of the lymph node. Unfortunately the biopsy revealed Hodgkin's lymphoma. On examination today there are no other palpable lymph nodes in the patient's neck axilla and supraclavicular area, inguinal crease etc. The patient does report a chronic cough that has been there for 2 months. He also has occasional night sweats but denies any fever. He has lost possibly 7 pounds in last year. This is concerning for symptomatic Hodgkin's disease. The patient has had no further workup aside from the excisional biopsy. Patient requires imaging studies to help stages Hodgkin's disease and a referral to oncology as soon as possible Past Medical History  Diagnosis Date  . Hernia   . Hyperlipidemia   . Hypercholesteremia   . Pars defect of lumbar spine     bilateral 2013 (Dr. Sherwood Gambler)   No past surgical history on file. Current Outpatient Prescriptions on File Prior to Visit  Medication Sig Dispense Refill  . celecoxib (CELEBREX) 200 MG capsule TAKE 1 CAPSULE (200 MG TOTAL) BY MOUTH DAILY. 30 capsule 5  . dexlansoprazole (DEXILANT) 60 MG capsule Take 1 capsule (60 mg total) by mouth daily. 30 capsule 11  . Multiple Vitamin (MULTIVITAMIN PO) Take by mouth daily.      . pravastatin (PRAVACHOL) 20 MG tablet TAKE 1 TABLET BY MOUTH AT BEDTIME 30 tablet 5  . aspirin 81 MG tablet Take 81 mg by mouth daily.    . Cyanocobalamin (B-12 PO) Take by mouth daily.      . Omega-3 Fatty Acids (FISH OIL PO) Take by mouth 3 (three) times daily.       No current facility-administered medications on file prior to visit.   No Known Allergies History   Social History  . Marital Status: Married   Spouse Name: N/A    Number of Children: N/A  . Years of Education: N/A   Occupational History  . Not on file.   Social History Main Topics  . Smoking status: Never Smoker   . Smokeless tobacco: Never Used  . Alcohol Use: Yes  . Drug Use: No  . Sexual Activity: Not on file     Comment: married, works in emergency services   Other Topics Concern  . Not on file   Social History Narrative     Review of Systems  All other systems reviewed and are negative.      Objective:   Physical Exam  Constitutional: He appears well-developed and well-nourished. No distress.  Cardiovascular: Normal rate, regular rhythm and normal heart sounds.   No murmur heard. Pulmonary/Chest: Effort normal and breath sounds normal. No respiratory distress. He has no wheezes. He has no rales.  Abdominal: Soft. Bowel sounds are normal. He exhibits no distension. There is no tenderness. There is no rebound and no guarding.  Lymphadenopathy:    He has no cervical adenopathy.    He has no axillary adenopathy.       Right: No inguinal, no supraclavicular and no epitrochlear adenopathy present.       Left: No inguinal, no supraclavicular and no epitrochlear adenopathy present.  Skin: He is not diaphoretic.  Vitals reviewed.  Assessment & Plan:  Hodgkin lymphoma - Plan: Ambulatory referral to Hematology  I will schedule the patient for a CT scan of the chest abdomen and pelvis with contrast as soon as possible to help stages Hodgkin's disease. I will also schedule a follow-up appointment with hematology as soon as possible. I spent approximately 45 minutes with the patient trying to answer all of his questions. His chronic cough could be due to a number of factors such as acid reflux, postnasal drip etc. However given his recent diagnosis of Hodgkin's disease, we need imaging of his chest immediately to rule out a lymph node compressing an airway.

## 2014-09-11 ENCOUNTER — Telehealth: Payer: Self-pay | Admitting: Hematology and Oncology

## 2014-09-11 ENCOUNTER — Encounter: Payer: Self-pay | Admitting: Hematology and Oncology

## 2014-09-11 ENCOUNTER — Ambulatory Visit
Admission: RE | Admit: 2014-09-11 | Discharge: 2014-09-11 | Disposition: A | Discharge status: Home / Self Care | Payer: 59 | Source: Ambulatory Visit | Attending: Family Medicine | Admitting: Family Medicine

## 2014-09-11 ENCOUNTER — Other Ambulatory Visit (INDEPENDENT_AMBULATORY_CARE_PROVIDER_SITE_OTHER): Payer: Self-pay | Admitting: General Surgery

## 2014-09-11 ENCOUNTER — Other Ambulatory Visit: Payer: 59

## 2014-09-11 ENCOUNTER — Ambulatory Visit (HOSPITAL_BASED_OUTPATIENT_CLINIC_OR_DEPARTMENT_OTHER): Payer: 59 | Admitting: Hematology and Oncology

## 2014-09-11 ENCOUNTER — Telehealth: Payer: Self-pay | Admitting: *Deleted

## 2014-09-11 ENCOUNTER — Ambulatory Visit: Payer: 59

## 2014-09-11 ENCOUNTER — Encounter (HOSPITAL_BASED_OUTPATIENT_CLINIC_OR_DEPARTMENT_OTHER): Payer: Self-pay | Admitting: *Deleted

## 2014-09-11 VITALS — BP 119/57 | HR 63 | Temp 97.7°F | Resp 18 | Ht 70.0 in | Wt 194.2 lb

## 2014-09-11 DIAGNOSIS — C8190 Hodgkin lymphoma, unspecified, unspecified site: Secondary | ICD-10-CM

## 2014-09-11 DIAGNOSIS — Z8571 Personal history of Hodgkin lymphoma: Secondary | ICD-10-CM | POA: Insufficient documentation

## 2014-09-11 HISTORY — DX: Hodgkin lymphoma, unspecified, unspecified site: C81.90

## 2014-09-11 LAB — COMPLETE METABOLIC PANEL WITH GFR
ALT: 36 U/L (ref 0–53)
AST: 39 U/L — ABNORMAL HIGH (ref 0–37)
Albumin: 3.7 g/dL (ref 3.5–5.2)
Alkaline Phosphatase: 157 U/L — ABNORMAL HIGH (ref 39–117)
BUN: 14 mg/dL (ref 6–23)
CALCIUM: 10 mg/dL (ref 8.4–10.5)
CHLORIDE: 99 meq/L (ref 96–112)
CO2: 28 mEq/L (ref 19–32)
Creat: 1 mg/dL (ref 0.50–1.35)
GFR, EST NON AFRICAN AMERICAN: 89 mL/min
GFR, Est African American: >89 mL/min
GLUCOSE: 144 mg/dL — AB (ref 70–99)
POTASSIUM: 4.6 meq/L (ref 3.5–5.3)
SODIUM: 141 meq/L (ref 135–145)
TOTAL PROTEIN: 7.7 g/dL (ref 6.0–8.3)
Total Bilirubin: 0.5 mg/dL (ref 0.2–1.2)

## 2014-09-11 MED ORDER — IOHEXOL 300 MG/ML  SOLN
100.0000 mL | Freq: Once | INTRAMUSCULAR | Status: AC | PRN
Start: 2014-09-11 — End: 2014-09-11
  Administered 2014-09-11: 100 mL via INTRAVENOUS

## 2014-09-11 NOTE — Telephone Encounter (Signed)
[facility] scheduled for 12/18 @0700  in short stay

## 2014-09-11 NOTE — Telephone Encounter (Signed)
s.w pt and advised on 12.16 echol....pt ok adn aware of d.t

## 2014-09-11 NOTE — Telephone Encounter (Signed)
gv adn printed appt scheda nd avs for pt fo rDec....sed added tx....Marland Kitchensent msg to LD for Echo and lvm for Debbie Everhardt at [facility] to sched port placement appt

## 2014-09-11 NOTE — Progress Notes (Signed)
Had labs cancer center today-09/11/14

## 2014-09-11 NOTE — Progress Notes (Signed)
Checked in new pt with no financial concerns at this time.  Pt has my card for any questions or concerns.

## 2014-09-11 NOTE — Telephone Encounter (Signed)
returned [employer] call and lvm that pt need and appt and to have port placed.

## 2014-09-13 NOTE — H&P (Signed)
Keith Forbes  Location: [facility] Patient #: [id number] DOB: [birth year] Married / Language: English / Race: White Male    History of Present Illness  Patient words: Hodgkins Lymphoma   This is a pleasant 48 year old gentleman, referred to me by Dr. Jenna Luo for evaluation of left inguinal or femoral lymphadenopathy. The patient has no prior history of lymphoproliferative disorders. He denies bites or stings. Does have a dog. He gets night sweats about once a month. No skin rashes. No weight loss. No urinary or penile symptoms. He was evaluated by Dr. Dennard Schaumann on August 06, 2014 when he noticed a painless lymph node in his left groin the same day. He was placed on Cipro. A CBC showed a WBC of 13,000. Urinalysis was negative. The lymph node has neither enlarged nor gotten smaller. It is firm. No skin problems noted. Because of persistence he was referred. An ultrasound was performed and this confirmed a 2.2 cm mass in the left inguinal area consistent with lymphadenopathy. The radiologist stated this could be benign or malignant. Comorbidities are minimal. History of hernia, hyperlipidemia, GERD. Lymph node biopsy recently confirms Hodgkin's Lymphoma.  He has seen Dr. Alvy Bimler (Med-onc) and she requests port a cath to facilitate chemotherapy.  Patient agrees with plan.   Other Problems  Back Pain Gastroesophageal Reflux Disease Hypercholesterolemia Inguinal Hernia  Diagnostic Studies History  Colonoscopy never  Allergies  No Known Drug Allergies12/11/2013  Medication History  Celecoxib (200MG  Capsule, Oral) Active. Dexilant (60MG  Capsule DR, Oral) Active. Pravastatin Sodium (20MG  Tablet, Oral) Active.  Social History  Alcohol use Occasional alcohol use. Caffeine use Coffee. No drug use Tobacco use Never smoker.  Family History  Respiratory Condition Father.  Review of Systems  General Present- Night Sweats. Not Present-  Appetite Loss, Chills, Fatigue, Fever, Weight Gain and Weight Loss. Skin Not Present- Change in Wart/Mole, Dryness, Hives, Jaundice, New Lesions, Non-Healing Wounds, Rash and Ulcer. HEENT Present- Wears glasses/contact lenses. Not Present- Earache, Hearing Loss, Hoarseness, Nose Bleed, Oral Ulcers, Ringing in the Ears, Seasonal Allergies, Sinus Pain, Sore Throat, Visual Disturbances and Yellow Eyes. Respiratory Present- Chronic Cough. Not Present- Bloody sputum, Difficulty Breathing, Snoring and Wheezing. Breast Not Present- Breast Mass, Breast Pain, Nipple Discharge and Skin Changes. Cardiovascular Not Present- Chest Pain, Difficulty Breathing Lying Down, Leg Cramps, Palpitations, Rapid Heart Rate, Shortness of Breath and Swelling of Extremities. Gastrointestinal Not Present- Abdominal Pain, Bloating, Bloody Stool, Change in Bowel Habits, Chronic diarrhea, Constipation, Difficulty Swallowing, Excessive gas, Gets full quickly at meals, Hemorrhoids, Indigestion, Nausea, Rectal Pain and Vomiting. Male Genitourinary Not Present- Blood in Urine, Change in Urinary Stream, Frequency, Impotence, Nocturia, Painful Urination, Urgency and Urine Leakage. Musculoskeletal Present- Back Pain. Not Present- Joint Pain, Joint Stiffness, Muscle Pain, Muscle Weakness and Swelling of Extremities. Neurological Not Present- Decreased Memory, Fainting, Headaches, Numbness, Seizures, Tingling, Tremor, Trouble walking and Weakness. Psychiatric Not Present- Anxiety, Bipolar, Change in Sleep Pattern, Depression, Fearful and Frequent crying. Endocrine Not Present- Cold Intolerance, Excessive Hunger, Hair Changes, Heat Intolerance, Hot flashes and New Diabetes. Hematology Not Present- Easy Bruising, Excessive bleeding, Gland problems, HIV and Persistent Infections.   Vitals  09/03/2014 9:48 AM Weight: 196 lb Height: 70in Body Surface Area: 2.1 m Body Mass Index: 28.12 kg/m Temp.: 97.25F(Temporal)  Pulse: 73  (Regular)  BP: 128/78 (Sitting, Left Arm, Standard)    Physical Exam  General Mental Status-Alert. General Appearance-Consistent with stated age. Hydration-Well hydrated. Voice-Normal.  Head and Neck Head-normocephalic, atraumatic with no lesions or palpable masses. Trachea-midline.  Thyroid Gland Characteristics - normal size and consistency.  Eye Eyeball - Bilateral-Extraocular movements intact. Sclera/Conjunctiva - Bilateral-No scleral icterus.  Chest and Lung Exam Chest and lung exam reveals -quiet, even and easy respiratory effort with no use of accessory muscles and on auscultation, normal breath sounds, no adventitious sounds and normal vocal resonance. Inspection Chest Wall - Normal. Back - normal.  Breast Breast - Left-Symmetric, Non Tender, No Biopsy scars, no Dimpling, No Inflammation, No Lumpectomy scars, No Mastectomy scars, No Peau d' Orange. Breast - Right-Symmetric, Non Tender, No Biopsy scars, no Dimpling, No Inflammation, No Lumpectomy scars, No Mastectomy scars, No Peau d' Orange. Breast Lump-No Palpable Breast Mass.  Cardiovascular Cardiovascular examination reveals -normal heart sounds, regular rate and rhythm with no murmurs and normal pedal pulses bilaterally.  Abdomen Inspection Inspection of the abdomen reveals - No Hernias. Skin - Scar - no surgical scars. Palpation/Percussion Palpation and Percussion of the abdomen reveal - Soft, Non Tender, No Rebound tenderness, No Rigidity (guarding) and No hepatosplenomegaly. Auscultation Auscultation of the abdomen reveals - Bowel sounds normal.  Neurologic Neurologic evaluation reveals -alert and oriented x 3 with no impairment of recent or remote memory. Mental Status-Normal.  Musculoskeletal Normal Exam - Left-Upper Extremity Strength Normal and Lower Extremity Strength Normal. Normal Exam - Right-Upper Extremity Strength Normal and Lower Extremity Strength  Normal.  Lymphatic Head & Neck  General Head & Neck Lymphatics: Bilateral - Description - Normal. Axillary  General Axillary Region: Bilateral - Description - Normal. Tenderness - Non Tender. Femoral & Inguinal  Generalized Femoral & Inguinal Lymphatics: Bilateral: Bilateral - Description - Note: There is a firm smooth and mobile mass just at or below the left inguinal ligament. This is consistent with the ultrasound findings of a 2.2 cm mass. Nontender. Skin healthy. There may be a little bit of thickening superior lateral to this. No scrotal mass. Right inguinal area looks normal. Tenderness - Non Tender.    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  Current Plans  Schedule for Surgery Dr. Alvy Bimler plans chemotherapy and requests port a cath insertion  [facility]. Dalbert Batman, M.D., [facility] General and Minimally invasive Surgery Breast and Colorectal Surgery Office:   [phone] Pager:   [phone]

## 2014-09-13 NOTE — Assessment & Plan Note (Signed)
We discussed the importance of staging. I recommend he proceed with CT scan, sedated bone marrow biopsy, echocardiogram, chemotherapy class, baseline breathing tests and blood work. We will regroup again in the new future we will plan to start him on chemotherapy within the next 2 weeks. I will refer him back to his surgeon for port placement prior to treatment.

## 2014-09-13 NOTE — Progress Notes (Signed)
[facility] CONSULT NOTE  Patient Care Team: [name], MD as PCP - General (Family Medicine)  CHIEF COMPLAINTS/PURPOSE OF CONSULTATION:  Newly diagnosed Hodgkin lymphoma  HISTORY OF PRESENTING ILLNESS:  Keith Forbes 48 y.o. male is here because of recent diagnosis of lymphoma. The patient had recently self palpated abnormal left inguinal lymphadenopathy. He has occasional drenching night sweats. 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 He brought to the his doctor's attention who ordered an ultrasound and confirmed lymphadenopathy. He subsequently saw a surgeon and had biopsy. Summary of oncologic history is as follows   Hodgkin lymphoma   09/04/2014 Pathology Results Accession: [id number] inguinal lymph node biopsy confirmed Hodgkin lymphoma   09/04/2014 Procedure Dr. Dalbert Batman performed excision of left inguinal lymph node biopsy   09/11/2014 Imaging CT scan of the chest, abdomen and pelvis show disease both above and below the diaphragm   His inguinal wound has healed.  MEDICAL HISTORY:  Past Medical History  Diagnosis Date  . Hernia   . Hyperlipidemia   . Hypercholesteremia   . Pars defect of lumbar spine     bilateral 2013 (Dr. Sherwood Gambler)  . Cancer     hodgkin lymphoma  . GERD (gastroesophageal reflux disease)   . Hodgkin lymphoma 09/11/2014  . Wears contact lenses   . Lymphoma     SURGICAL HISTORY: Past Surgical History  Procedure Laterality Date  . Lymph node biopsy  12/15    SOCIAL HISTORY: History   Social History  . Marital Status: Married    Spouse Name: N/A    Number of Children: N/A  . Years of Education: N/A   Occupational History  . Not on file.   Social History Main Topics  . Smoking status: Never Smoker   . Smokeless tobacco: Never Used  . Alcohol Use: Yes  . Drug Use: No  . Sexual Activity: Not on file     Comment: married, works in emergency services    Other Topics Concern  . Not on file   Social History Narrative    FAMILY HISTORY: Family History  Problem Relation Age of Onset  . Hyperlipidemia Father   . Heart disease Father   . Cancer Paternal Aunt     breast ca    ALLERGIES:  has No Known Allergies.  MEDICATIONS:  Current Outpatient Prescriptions  Medication Sig Dispense Refill  . aspirin 81 MG tablet Take 81 mg by mouth daily.    . celecoxib (CELEBREX) 200 MG capsule TAKE 1 CAPSULE (200 MG TOTAL) BY MOUTH DAILY. 30 capsule 5  . Cyanocobalamin (B-12 PO) Take by mouth daily.      Marland Kitchen dexlansoprazole (DEXILANT) 60 MG capsule Take 1 capsule (60 mg total) by mouth daily. 30 capsule 11  . Multiple Vitamin (MULTIVITAMIN PO) Take by mouth daily.      . Omega-3 Fatty Acids (FISH OIL PO) Take by mouth 3 (three) times daily.      . pravastatin (PRAVACHOL) 20 MG tablet TAKE 1 TABLET BY MOUTH AT BEDTIME 30 tablet 5   No current facility-administered medications for this visit.    REVIEW OF SYSTEMS:   Constitutional: Denies fevers, chills or abnormal night sweats Eyes: Denies blurriness of vision, double vision or watery eyes Ears, nose, mouth, throat, and face: Denies mucositis or sore throat Respiratory: Denies cough, dyspnea or wheezes Cardiovascular: Denies palpitation, chest discomfort or lower extremity swelling Gastrointestinal:  Denies nausea, heartburn or change  in bowel habits Skin: Denies abnormal skin rashesBehavioral/Psych: Mood is stable, no new changes  All other systems were reviewed with the patient and are negative.  PHYSICAL EXAMINATION: ECOG PERFORMANCE STATUS: 1 - Symptomatic but completely ambulatory  Filed Vitals:   09/11/14 1055  BP: 119/57  Pulse: 63  Temp: 97.7 F (36.5 C)  Resp: 18   Filed Weights   09/11/14 1055  Weight: 194 lb 3.2 oz (88.089 kg)    GENERAL:alert, no distress and comfortable SKIN: skin color, texture, turgor are normal, no rashes or significant lesions EYES: normal,  conjunctiva are pink and non-injected, sclera clear OROPHARYNX:no exudate, no erythema and lips, buccal mucosa, and tongue normal  NECK: supple, thyroid normal size, non-tender, without nodularity LYMPH:  no palpable lymphadenopathy in the cervical, axillary or inguinal. The surgical wound has healed LUNGS: clear to auscultation and percussion with normal breathing effort HEART: regular rate & rhythm and no murmurs and no lower extremity edema ABDOMEN:abdomen soft, non-tender and normal bowel sounds Musculoskeletal:no cyanosis of digits and no clubbing  PSYCH: alert & oriented x 3 with fluent speech NEURO: no focal motor/sensory deficits  LABORATORY DATA:  I have reviewed the data as listed Lab Results  Component Value Date   WBC 13.9* 08/06/2014   HGB 12.0* 08/06/2014   HCT 35.5* 08/06/2014   MCV 82.8 08/06/2014   PLT 325 08/06/2014    Recent Labs  12/18/13 0705 09/11/14 0808  NA 139 141  K 4.9 4.6  CL 104 99  CO2 29 28  GLUCOSE 86 144*  BUN 14 14  CREATININE 0.96 1.00  CALCIUM 10.3 10.0  GFRNONAA  --  89  GFRAA  --  >89  PROT 7.3 7.7  ALBUMIN 4.0 3.7  AST 35 39*  ALT 34 36  ALKPHOS 101 157*  BILITOT 0.5 0.5    RADIOGRAPHIC STUDIES: I have personally reviewed the radiological images as listed and agreed with the findings in the report. Ct Chest W Contrast  09/11/2014   CLINICAL DATA:  Current history of Hodgkin's lymphoma.  EXAM: CT CHEST, ABDOMEN, AND PELVIS WITH CONTRAST  TECHNIQUE: Multidetector CT imaging of the chest, abdomen and pelvis was performed following the standard protocol during bolus administration of intravenous contrast.  CONTRAST:  137m OMNIPAQUE IOHEXOL 300 MG/ML  SOLN  COMPARISON:  None.  FINDINGS: CT CHEST FINDINGS  No pneumothorax or pleural effusion is noted. Thoracic aorta and pulmonary arteries appear grossly normal. Right peritracheal and precarinal adenopathy is noted with the largest lymph node measuring 19 x 13 mm. 38 x 22 mm mass or  enlarged internal mammary lymph node is noted posterior to the right anterior ribcage just lateral to the sternum at the level the superior mediastinum. No significant osseous abnormality is noted in the chest.  CT ABDOMEN AND PELVIS FINDINGS  No gallstones are noted. Probable cyst or hemangioma is noted in right hepatic lobe. The spleen and pancreas appear normal. Adrenal glands and kidneys appear normal. No hydronephrosis or renal obstruction is noted. Atherosclerotic calcifications are seen in the abdominal aorta without evidence of aneurysm. There is no evidence of bowel obstruction. Stool is noted throughout the colon. The appendix appears normal. Urinary bladder appears normal. Postsurgical changes are noted in the left inguinal region left external iliac adenopathy is noted with the largest lymph node measuring 22 x 11 mm. Right common iliac lymph node measuring 27 x 15 mm is noted. Mildly enlarged periaortic adenopathy is noted with the largest lymph node measuring 12 x  8 mm in the left periaortic region. No abnormal fluid collection is noted. Bilateral pars defects are seen at L5.  IMPRESSION: Enlarged mediastinal, periaortic and bilateral iliac adenopathy as described above consistent with a history of Hodgkin's lymphoma.   Electronically Signed   By: Sabino Dick M.D.   On: 09/11/2014 16:00   Ct Abdomen Pelvis W Contrast  09/11/2014   CLINICAL DATA:  Current history of Hodgkin's lymphoma.  EXAM: CT CHEST, ABDOMEN, AND PELVIS WITH CONTRAST  TECHNIQUE: Multidetector CT imaging of the chest, abdomen and pelvis was performed following the standard protocol during bolus administration of intravenous contrast.  CONTRAST:  168m OMNIPAQUE IOHEXOL 300 MG/ML  SOLN  COMPARISON:  None.  FINDINGS: CT CHEST FINDINGS  No pneumothorax or pleural effusion is noted. Thoracic aorta and pulmonary arteries appear grossly normal. Right peritracheal and precarinal adenopathy is noted with the largest lymph node measuring  19 x 13 mm. 38 x 22 mm mass or enlarged internal mammary lymph node is noted posterior to the right anterior ribcage just lateral to the sternum at the level the superior mediastinum. No significant osseous abnormality is noted in the chest.  CT ABDOMEN AND PELVIS FINDINGS  No gallstones are noted. Probable cyst or hemangioma is noted in right hepatic lobe. The spleen and pancreas appear normal. Adrenal glands and kidneys appear normal. No hydronephrosis or renal obstruction is noted. Atherosclerotic calcifications are seen in the abdominal aorta without evidence of aneurysm. There is no evidence of bowel obstruction. Stool is noted throughout the colon. The appendix appears normal. Urinary bladder appears normal. Postsurgical changes are noted in the left inguinal region left external iliac adenopathy is noted with the largest lymph node measuring 22 x 11 mm. Right common iliac lymph node measuring 27 x 15 mm is noted. Mildly enlarged periaortic adenopathy is noted with the largest lymph node measuring 12 x 8 mm in the left periaortic region. No abnormal fluid collection is noted. Bilateral pars defects are seen at L5.  IMPRESSION: Enlarged mediastinal, periaortic and bilateral iliac adenopathy as described above consistent with a history of Hodgkin's lymphoma.   Electronically Signed   By: JSabino DickM.D.   On: 09/11/2014 16:00    ASSESSMENT & PLAN:  Hodgkin lymphoma We discussed the importance of staging. I recommend he proceed with CT scan, sedated bone marrow biopsy, echocardiogram, chemotherapy class, baseline breathing tests and blood work. We will regroup again in the new future we will plan to start him on chemotherapy within the next 2 weeks. I will refer him back to his surgeon for port placement prior to treatment.     Orders Placed This Encounter  Procedures  . CBC with Differential    Standing Status: Future     Number of Occurrences:      Standing Expiration Date: 10/16/2015  .  Comprehensive metabolic panel    Standing Status: Future     Number of Occurrences:      Standing Expiration Date: 10/16/2015  . Lactate dehydrogenase    Standing Status: Future     Number of Occurrences:      Standing Expiration Date: 10/16/2015  . Flow Cytometry    Standing Status: Future     Number of Occurrences:      Standing Expiration Date: 10/16/2015  . Uric Acid    Standing Status: Future     Number of Occurrences:      Standing Expiration Date: 10/16/2015  . 2D Echocardiogram without contrast    Standing  Status: Future     Number of Occurrences:      Standing Expiration Date: 09/11/2015    Order Specific Question:  Type of Echo    Answer:  Limited    Order Specific Question:  Reason for Exam    Answer:  Evaluate EF    Order Specific Question:  Where should this test be performed    Answer:  [facility]  . Pulmonary function test    Standing Status: Future     Number of Occurrences:      Standing Expiration Date: 09/12/2015    Order Specific Question:  Where should this test be performed?    Answer:  [facility]    Order Specific Question:  Full PFT: includes the following: basic spirometry, spirometry pre & post bronchodilator, diffusion capacity (DLCO), lung volumes    Answer:  FULL PFT Without spirometry post bronchodilator    Order Specific Question:  MIP/MEP    Answer:  Yes    Order Specific Question:  6 minute walk    Answer:  No    Order Specific Question:  ABG    Answer:  No    Order Specific Question:  Diffusion capacity (DLCO)    Answer:  Yes    Order Specific Question:  Lung volumes    Answer:  Yes    Order Specific Question:  Methacholine challenge    Answer:  No    All questions were answered. The patient knows to call the clinic with any problems, questions or concerns. I spent 40 minutes counseling the patient face to face. The total time spent in the appointment was 60 minutes and more than 50% was on counseling.     [facility], Allex Madia, MD 09/13/2014  8:14 PM

## 2014-09-14 ENCOUNTER — Ambulatory Visit (HOSPITAL_COMMUNITY)
Admission: RE | Admit: 2014-09-14 | Discharge: 2014-09-14 | Disposition: A | Discharge status: Home / Self Care | Payer: 59 | Source: Ambulatory Visit | Attending: Hematology and Oncology | Admitting: Hematology and Oncology

## 2014-09-14 DIAGNOSIS — C8190 Hodgkin lymphoma, unspecified, unspecified site: Secondary | ICD-10-CM | POA: Insufficient documentation

## 2014-09-14 LAB — PULMONARY FUNCTION TEST
DL/VA % pred: 102 %
DL/VA: 4.77 ml/min/mmHg/L
DLCO UNC: 35.22 ml/min/mmHg
DLCO unc % pred: 108 %
FEF 25-75 Pre: 5.18 L/sec
FEF2575-%Pred-Pre: 144 %
FEV1-%Pred-Pre: 112 %
FEV1-Pre: 4.5 L
FEV1FVC-%PRED-PRE: 107 %
FEV6-%PRED-PRE: 108 %
FEV6-Pre: 5.35 L
FEV6FVC-%Pred-Pre: 103 %
FVC-%Pred-Pre: 104 %
FVC-Pre: 5.35 L
PRE FEV1/FVC RATIO: 84 %
Pre FEV6/FVC Ratio: 100 %
RV % PRED: 100 %
RV: 2 L
TLC % PRED: 108 %
TLC: 7.56 L

## 2014-09-15 ENCOUNTER — Other Ambulatory Visit: Payer: 59

## 2014-09-15 ENCOUNTER — Encounter (HOSPITAL_BASED_OUTPATIENT_CLINIC_OR_DEPARTMENT_OTHER): Payer: Self-pay | Admitting: *Deleted

## 2014-09-15 ENCOUNTER — Telehealth: Payer: Self-pay | Admitting: *Deleted

## 2014-09-15 ENCOUNTER — Encounter: Payer: Self-pay | Admitting: *Deleted

## 2014-09-15 ENCOUNTER — Ambulatory Visit (HOSPITAL_COMMUNITY): Payer: 59

## 2014-09-15 ENCOUNTER — Ambulatory Visit (HOSPITAL_BASED_OUTPATIENT_CLINIC_OR_DEPARTMENT_OTHER): Payer: 59 | Admitting: Anesthesiology

## 2014-09-15 ENCOUNTER — Ambulatory Visit (HOSPITAL_BASED_OUTPATIENT_CLINIC_OR_DEPARTMENT_OTHER)
Admission: RE | Admit: 2014-09-15 | Discharge: 2014-09-15 | Disposition: A | Discharge status: Home / Self Care | Payer: 59 | Source: Ambulatory Visit | Attending: General Surgery | Admitting: General Surgery

## 2014-09-15 ENCOUNTER — Encounter (HOSPITAL_BASED_OUTPATIENT_CLINIC_OR_DEPARTMENT_OTHER)
Admission: RE | Disposition: A | Discharge status: Home / Self Care | Payer: Self-pay | Source: Ambulatory Visit | Attending: General Surgery

## 2014-09-15 DIAGNOSIS — K219 Gastro-esophageal reflux disease without esophagitis: Secondary | ICD-10-CM | POA: Diagnosis not present

## 2014-09-15 DIAGNOSIS — Z8571 Personal history of Hodgkin lymphoma: Secondary | ICD-10-CM | POA: Diagnosis present

## 2014-09-15 DIAGNOSIS — E780 Pure hypercholesterolemia: Secondary | ICD-10-CM | POA: Insufficient documentation

## 2014-09-15 DIAGNOSIS — K4090 Unilateral inguinal hernia, without obstruction or gangrene, not specified as recurrent: Secondary | ICD-10-CM | POA: Diagnosis not present

## 2014-09-15 DIAGNOSIS — Z836 Family history of other diseases of the respiratory system: Secondary | ICD-10-CM | POA: Diagnosis not present

## 2014-09-15 DIAGNOSIS — E785 Hyperlipidemia, unspecified: Secondary | ICD-10-CM | POA: Insufficient documentation

## 2014-09-15 DIAGNOSIS — C8195 Hodgkin lymphoma, unspecified, lymph nodes of inguinal region and lower limb: Secondary | ICD-10-CM | POA: Diagnosis not present

## 2014-09-15 DIAGNOSIS — Z95828 Presence of other vascular implants and grafts: Secondary | ICD-10-CM

## 2014-09-15 HISTORY — PX: PORTACATH PLACEMENT: SHX2246

## 2014-09-15 HISTORY — DX: Presence of spectacles and contact lenses: Z97.3

## 2014-09-15 HISTORY — DX: Non-Hodgkin lymphoma, unspecified, unspecified site: C85.90

## 2014-09-15 LAB — POCT HEMOGLOBIN-HEMACUE: Hemoglobin: 14 g/dL (ref 13.0–17.0)

## 2014-09-15 SURGERY — INSERTION, TUNNELED CENTRAL VENOUS DEVICE, WITH PORT
Anesthesia: General | Site: Chest | Laterality: Left

## 2014-09-15 MED ORDER — CEFAZOLIN SODIUM-DEXTROSE 2-3 GM-% IV SOLR
2.0000 g | INTRAVENOUS | Status: AC
  Administered 2014-09-15: 2 g via INTRAVENOUS

## 2014-09-15 MED ORDER — ONDANSETRON HCL 4 MG/2ML IJ SOLN
INTRAMUSCULAR | Status: DC | PRN
  Administered 2014-09-15: 4 mg via INTRAVENOUS

## 2014-09-15 MED ORDER — OXYCODONE HCL 5 MG PO TABS
5.0000 mg | ORAL_TABLET | Freq: Once | ORAL | Status: AC | PRN
  Administered 2014-09-15: 5 mg via ORAL

## 2014-09-15 MED ORDER — HEPARIN (PORCINE) IN NACL 2-0.9 UNIT/ML-% IJ SOLN
INTRAMUSCULAR | Status: AC
  Filled 2014-09-15: qty 500

## 2014-09-15 MED ORDER — CHLORHEXIDINE GLUCONATE 4 % EX LIQD: 1.0000 "application " | Freq: Once | CUTANEOUS | Status: DC

## 2014-09-15 MED ORDER — LIDOCAINE HCL (PF) 1 % IJ SOLN
INTRAMUSCULAR | Status: AC
  Filled 2014-09-15: qty 30

## 2014-09-15 MED ORDER — BUPIVACAINE-EPINEPHRINE (PF) 0.5% -1:200000 IJ SOLN
INTRAMUSCULAR | Status: DC | PRN
  Administered 2014-09-15: 9.5 mL

## 2014-09-15 MED ORDER — SUCCINYLCHOLINE CHLORIDE 20 MG/ML IJ SOLN
INTRAMUSCULAR | Status: AC
  Filled 2014-09-15: qty 1

## 2014-09-15 MED ORDER — SODIUM BICARBONATE 4 % IV SOLN
INTRAVENOUS | Status: AC
  Filled 2014-09-15: qty 5

## 2014-09-15 MED ORDER — LIDOCAINE-EPINEPHRINE (PF) 1 %-1:200000 IJ SOLN
INTRAMUSCULAR | Status: AC
  Filled 2014-09-15: qty 10

## 2014-09-15 MED ORDER — HEPARIN SOD (PORK) LOCK FLUSH 100 UNIT/ML IV SOLN
INTRAVENOUS | Status: DC | PRN
  Administered 2014-09-15: 500 [IU] via INTRAVENOUS

## 2014-09-15 MED ORDER — DEXAMETHASONE SODIUM PHOSPHATE 4 MG/ML IJ SOLN
INTRAMUSCULAR | Status: DC | PRN
  Administered 2014-09-15: 10 mg via INTRAVENOUS

## 2014-09-15 MED ORDER — HYDROCODONE-ACETAMINOPHEN 5-325 MG PO TABS: 1.0000 | ORAL_TABLET | Freq: Four times a day (QID) | ORAL | Status: DC | PRN

## 2014-09-15 MED ORDER — MIDAZOLAM HCL 2 MG/2ML IJ SOLN
INTRAMUSCULAR | Status: AC
  Filled 2014-09-15: qty 2

## 2014-09-15 MED ORDER — HEPARIN (PORCINE) IN NACL 2-0.9 UNIT/ML-% IJ SOLN
INTRAMUSCULAR | Status: DC | PRN
  Administered 2014-09-15: 1 via INTRAVENOUS

## 2014-09-15 MED ORDER — BUPIVACAINE HCL (PF) 0.25 % IJ SOLN
INTRAMUSCULAR | Status: AC
  Filled 2014-09-15: qty 30

## 2014-09-15 MED ORDER — HYDROMORPHONE HCL 1 MG/ML IJ SOLN: 0.2500 mg | INTRAMUSCULAR | Status: DC | PRN

## 2014-09-15 MED ORDER — ONDANSETRON HCL 4 MG/2ML IJ SOLN: 4.0000 mg | Freq: Once | INTRAMUSCULAR | Status: DC | PRN

## 2014-09-15 MED ORDER — BUPIVACAINE-EPINEPHRINE (PF) 0.25% -1:200000 IJ SOLN
INTRAMUSCULAR | Status: AC
  Filled 2014-09-15: qty 30

## 2014-09-15 MED ORDER — OXYCODONE HCL 5 MG PO TABS
ORAL_TABLET | ORAL | Status: AC
  Filled 2014-09-15: qty 1

## 2014-09-15 MED ORDER — OXYCODONE HCL 5 MG/5ML PO SOLN: 5.0000 mg | Freq: Once | ORAL | Status: AC | PRN

## 2014-09-15 MED ORDER — FENTANYL CITRATE 0.05 MG/ML IJ SOLN
INTRAMUSCULAR | Status: AC
  Filled 2014-09-15: qty 4

## 2014-09-15 MED ORDER — CEFAZOLIN SODIUM-DEXTROSE 2-3 GM-% IV SOLR
INTRAVENOUS | Status: AC
  Filled 2014-09-15: qty 50

## 2014-09-15 MED ORDER — PROPOFOL 10 MG/ML IV BOLUS
INTRAVENOUS | Status: DC | PRN
  Administered 2014-09-15: 200 mg via INTRAVENOUS

## 2014-09-15 MED ORDER — LACTATED RINGERS IV SOLN
INTRAVENOUS | Status: DC
  Administered 2014-09-15: 10:00:00 via INTRAVENOUS

## 2014-09-15 MED ORDER — LIDOCAINE HCL (CARDIAC) 20 MG/ML IV SOLN
INTRAVENOUS | Status: DC | PRN
  Administered 2014-09-15: 30 mg via INTRAVENOUS

## 2014-09-15 MED ORDER — MIDAZOLAM HCL 5 MG/5ML IJ SOLN
INTRAMUSCULAR | Status: DC | PRN
  Administered 2014-09-15: 2 mg via INTRAVENOUS

## 2014-09-15 MED ORDER — FENTANYL CITRATE 0.05 MG/ML IJ SOLN
INTRAMUSCULAR | Status: DC | PRN
  Administered 2014-09-15: 100 ug via INTRAVENOUS

## 2014-09-15 MED ORDER — HEPARIN SOD (PORK) LOCK FLUSH 100 UNIT/ML IV SOLN
INTRAVENOUS | Status: AC
  Filled 2014-09-15: qty 5

## 2014-09-15 SURGICAL SUPPLY — 56 items
APL SKNCLS STERI-STRIP NONHPOA (GAUZE/BANDAGES/DRESSINGS)
BAG DECANTER FOR FLEXI CONT (MISCELLANEOUS) ×2 IMPLANT
BENZOIN TINCTURE PRP APPL 2/3 (GAUZE/BANDAGES/DRESSINGS) IMPLANT
BLADE CLIPPER SURG (BLADE) ×1 IMPLANT
BLADE HEX COATED 2.75 (ELECTRODE) ×2 IMPLANT
BLADE SURG 15 STRL LF DISP TIS (BLADE) ×1 IMPLANT
BLADE SURG 15 STRL SS (BLADE) ×2
CANISTER SUCT 1200ML W/VALVE (MISCELLANEOUS) IMPLANT
CHLORAPREP W/TINT 26ML (MISCELLANEOUS) ×2 IMPLANT
COVER BACK TABLE 60X90IN (DRAPES) ×2 IMPLANT
COVER MAYO STAND STRL (DRAPES) ×2 IMPLANT
COVER PROBE 5X48 (MISCELLANEOUS) ×2
DECANTER SPIKE VIAL GLASS SM (MISCELLANEOUS) IMPLANT
DRAPE C-ARM 42X72 X-RAY (DRAPES) ×2 IMPLANT
DRAPE LAPAROSCOPIC ABDOMINAL (DRAPES) ×2 IMPLANT
DRAPE UTILITY XL STRL (DRAPES) ×2 IMPLANT
DRSG TEGADERM 2-3/8X2-3/4 SM (GAUZE/BANDAGES/DRESSINGS) IMPLANT
DRSG TEGADERM 4X10 (GAUZE/BANDAGES/DRESSINGS) IMPLANT
DRSG TEGADERM 4X4.75 (GAUZE/BANDAGES/DRESSINGS) IMPLANT
ELECT REM PT RETURN 9FT ADLT (ELECTROSURGICAL) ×2
ELECTRODE REM PT RTRN 9FT ADLT (ELECTROSURGICAL) ×1 IMPLANT
GLOVE BIO SURGEON STRL SZ7 (GLOVE) ×1 IMPLANT
GLOVE EUDERMIC 7 POWDERFREE (GLOVE) ×2 IMPLANT
GLOVE EXAM NITRILE MD LF STRL (GLOVE) ×1 IMPLANT
GOWN STRL REUS W/ TWL LRG LVL3 (GOWN DISPOSABLE) ×1 IMPLANT
GOWN STRL REUS W/ TWL XL LVL3 (GOWN DISPOSABLE) ×1 IMPLANT
GOWN STRL REUS W/TWL LRG LVL3 (GOWN DISPOSABLE) ×2
GOWN STRL REUS W/TWL XL LVL3 (GOWN DISPOSABLE) ×2
IV CATH PLACEMENT UNIT 16 GA (IV SOLUTION) IMPLANT
IV KIT MINILOC 20X1 SAFETY (NEEDLE) IMPLANT
KIT BARDPORT ISP (Port) IMPLANT
KIT CVR 48X5XPRB PLUP LF (MISCELLANEOUS) ×1 IMPLANT
KIT PORT POWER 8FR ISP CVUE (Catheter) ×2 IMPLANT
LIQUID BAND (GAUZE/BANDAGES/DRESSINGS) ×2 IMPLANT
NDL BLUNT 17GA (NEEDLE) IMPLANT
NDL HYPO 25X1 1.5 SAFETY (NEEDLE) ×1 IMPLANT
NEEDLE BLUNT 17GA (NEEDLE) IMPLANT
NEEDLE HYPO 22GX1.5 SAFETY (NEEDLE) ×2 IMPLANT
NEEDLE HYPO 25X1 1.5 SAFETY (NEEDLE) ×2 IMPLANT
PACK BASIN DAY SURGERY FS (CUSTOM PROCEDURE TRAY) ×2 IMPLANT
PENCIL BUTTON HOLSTER BLD 10FT (ELECTRODE) ×2 IMPLANT
SET SHEATH INTRODUCER 10FR (MISCELLANEOUS) IMPLANT
SHEATH COOK PEEL AWAY SET 9F (SHEATH) IMPLANT
SLEEVE SCD COMPRESS KNEE MED (MISCELLANEOUS) ×2 IMPLANT
SLEEVE SURGEON STRL (DRAPES) ×1 IMPLANT
SPONGE GAUZE 4X4 12PLY STER LF (GAUZE/BANDAGES/DRESSINGS) IMPLANT
STRIP CLOSURE SKIN 1/2X4 (GAUZE/BANDAGES/DRESSINGS) IMPLANT
SUT MNCRL AB 4-0 PS2 18 (SUTURE) ×2 IMPLANT
SUT PROLENE 2 0 CT2 30 (SUTURE) ×2 IMPLANT
SUT VICRYL 3-0 CR8 SH (SUTURE) ×2 IMPLANT
SYR 5ML LUER SLIP (SYRINGE) ×2 IMPLANT
SYRINGE 10CC LL (SYRINGE) ×2 IMPLANT
TOWEL OR 17X24 6PK STRL BLUE (TOWEL DISPOSABLE) ×4 IMPLANT
TOWEL OR NON WOVEN STRL DISP B (DISPOSABLE) ×2 IMPLANT
TUBE CONNECTING 20X1/4 (TUBING) IMPLANT
YANKAUER SUCT BULB TIP NO VENT (SUCTIONS) IMPLANT

## 2014-09-15 NOTE — Anesthesia Postprocedure Evaluation (Signed)
  Anesthesia Post-op Note  Patient: Keith Forbes  Procedure(s) Performed: Procedure(s): INSERTION PORT-A-CATH WITH ULTRA SOUND (Left)  Patient Location: PACU  Anesthesia Type:General  Level of Consciousness: awake, alert  and oriented  Airway and Oxygen Therapy: Patient Spontanous Breathing  Post-op Pain: none  Post-op Assessment: Post-op Vital signs reviewed, Patient's Cardiovascular Status Stable, Respiratory Function Stable, Patent Airway, No signs of Nausea or vomiting and Pain level controlled  Post-op Vital Signs: stable  Last Vitals:  Filed Vitals:   09/15/14 1230  BP: 105/50  Pulse: 57  Temp:   Resp: 10    Complications: No apparent anesthesia complications

## 2014-09-15 NOTE — Transfer of Care (Signed)
Immediate Anesthesia Transfer of Care Note  Patient: Keith Forbes  Procedure(s) Performed: Procedure(s): INSERTION PORT-A-CATH WITH ULTRA SOUND (Left)  Patient Location: PACU  Anesthesia Type:General  Level of Consciousness: awake, sedated and patient cooperative  Airway & Oxygen Therapy: Patient Spontanous Breathing and Patient connected to face mask oxygen  Post-op Assessment: Report given to PACU RN and Post -op Vital signs reviewed and stable  Post vital signs: Reviewed and stable  Complications: No apparent anesthesia complications

## 2014-09-15 NOTE — Discharge Instructions (Signed)
° ° °PORT-A-CATH: POST OP INSTRUCTIONS ° °Always review your discharge instruction sheet given to you by the facility where your surgery was performed.  ° °1. A prescription for pain medication may be given to you upon discharge. Take your pain medication as prescribed, if needed. If narcotic pain medicine is not needed, then you make take acetaminophen (Tylenol) or ibuprofen (Advil) as needed.  °2. Take your usually prescribed medications unless otherwise directed. °3. If you need a refill on your pain medication, please contact our office. All narcotic pain medicine now requires a paper prescription.  Phoned in and fax refills are no longer allowed by law.  Prescriptions will not be filled after 5 pm or on weekends.  °4. You should follow a light diet for the remainder of the day after your procedure. °5. Most patients will experience some mild swelling and/or bruising in the area of the incision. It may take several days to resolve. °6. It is common to experience some constipation if taking pain medication after surgery. Increasing fluid intake and taking a stool softener (such as Colace) will usually help or prevent this problem from occurring. A mild laxative (Milk of Magnesia or Miralax) should be taken according to package directions if there are no bowel movements after 48 hours.  °7. Unless discharge instructions indicate otherwise, you may remove your bandages 48 hours after surgery, and you may shower at that time. You may have steri-strips (small white skin tapes) in place directly over the incision.  These strips should be left on the skin for 7-10 days.  If your surgeon used Dermabond (skin glue) on the incision, you may shower in 24 hours.  The glue will flake off over the next 2-3 weeks.  °8. If your port is left accessed at the end of surgery (needle left in port), the dressing cannot get wet and should only by changed by a healthcare professional. When the port is no longer accessed (when the  needle has been removed), follow step 7.   °9. ACTIVITIES:  Limit activity involving your arms for the next 72 hours. Do no strenuous exercise or activity for 1 week. You may drive when you are no longer taking prescription pain medication, you can comfortably wear a seatbelt, and you can maneuver your car. °10.You may need to see your doctor in the office for a follow-up appointment.  Please °      check with your doctor.  °11.When you receive a new Port-a-Cath, you will get a product guide and  °      ID card.  Please keep them in case you need them. ° °WHEN TO CALL YOUR DOCTOR ([phone]): °1. Fever over 101.0 °2. Chills °3. Continued bleeding from incision °4. Increased redness and tenderness at the site °5. Shortness of breath, difficulty breathing ° ° °The clinic staff is available to answer your questions during regular business hours. Please don’t hesitate to call and ask to speak to one of the nurses or medical assistants for clinical concerns. If you have a medical emergency, go to the nearest emergency room or call 911.  A surgeon from [facility] is always on call at the hospital.  ° ° ° °For further information, please visit [email or web address] ° ° °Post Anesthesia Home Care Instructions ° °Activity: °Get plenty of rest for the remainder of the day. A responsible adult should stay with you for 24 hours following the procedure.  °For the next 24 hours, DO NOT: °-Drive a car °-  Operate machinery °-Drink alcoholic beverages °-Take any medication unless instructed by your physician °-Make any legal decisions or sign important papers. ° °Meals: °Start with liquid foods such as gelatin or soup. Progress to regular foods as tolerated. Avoid greasy, spicy, heavy foods. If nausea and/or vomiting occur, drink only clear liquids until the nausea and/or vomiting subsides. Call your physician if vomiting continues. ° °Special Instructions/Symptoms: °Your throat may feel dry or sore from the  anesthesia or the breathing tube placed in your throat during surgery. If this causes discomfort, gargle with warm salt water. The discomfort should disappear within 24 hours. ° ° ° ° ° ° °

## 2014-09-15 NOTE — Interval H&P Note (Signed)
History and Physical Interval Note:  09/15/2014 10:45 AM  Keith Forbes  has presented today for surgery, with the diagnosis of Lymphoma  The goals and the various methods of treatment have been discussed with the patient and family. After consideration of numerous  risks, benefits and other options for treatment, the patient has consented to  Procedure(s): INSERTION PORT-A-CATH WITH ULTRA SOUND (N/A) as a surgical intervention .  The patient's history has been reviewed, patient examined today, no change in status, stable for surgery.  I have reviewed the patient's chart and labs.  Questions were answered to the patient's satisfaction.     [name]

## 2014-09-15 NOTE — Telephone Encounter (Signed)
No additional note

## 2014-09-15 NOTE — Anesthesia Procedure Notes (Signed)
Procedure Name: LMA Insertion Date/Time: 09/15/2014 10:59 AM Performed by: Lyndee Leo Pre-anesthesia Checklist: Patient identified, Emergency Drugs available, Suction available and Patient being monitored Patient Re-evaluated:Patient Re-evaluated prior to inductionOxygen Delivery Method: Circle System Utilized Preoxygenation: Pre-oxygenation with 100% oxygen Intubation Type: IV induction Ventilation: Mask ventilation without difficulty LMA: LMA inserted LMA Size: 4.0 Number of attempts: 1 Airway Equipment and Method: bite block Placement Confirmation: positive ETCO2 Tube secured with: Tape Dental Injury: Teeth and Oropharynx as per pre-operative assessment

## 2014-09-15 NOTE — Anesthesia Preprocedure Evaluation (Signed)
Anesthesia Evaluation  Patient identified by MRN, date of birth, ID band Patient awake    Reviewed: Allergy & Precautions, H&P , NPO status , Patient's Chart, lab work & pertinent test results  Airway Mallampati: I  TM Distance: >3 FB Neck ROM: Full    Dental  (+) Teeth Intact, Dental Advisory Given   Pulmonary  breath sounds clear to auscultation        Cardiovascular Rhythm:Regular Rate:Normal     Neuro/Psych    GI/Hepatic   Endo/Other    Renal/GU      Musculoskeletal   Abdominal   Peds  Hematology   Anesthesia Other Findings   Reproductive/Obstetrics                             Anesthesia Physical  Anesthesia Plan  ASA: II  Anesthesia Plan: General   Post-op Pain Management:    Induction: Intravenous  Airway Management Planned: LMA  Additional Equipment:   Intra-op Plan:   Post-operative Plan:   Informed Consent: I have reviewed the patients History and Physical, chart, labs and discussed the procedure including the risks, benefits and alternatives for the proposed anesthesia with the patient or authorized representative who has indicated his/her understanding and acceptance.   Dental advisory given  Plan Discussed with: CRNA and Anesthesiologist  Anesthesia Plan Comments: (Hodgkins Lymphoma  Plan GA with LMA  Jeda Pardue)        Anesthesia Quick Evaluation  

## 2014-09-15 NOTE — Op Note (Signed)
Patient Name:           Keith Forbes   Date of Surgery:        09/15/2014  Pre op Diagnosis:      Hodgkin's lymphoma  Post op Diagnosis:    Hodgkin's lymphoma  Procedure:                 Insertion of 8 French power port ClearVue tunneled venous vascular access device Use of fluoroscopy for guidance and positioning  Surgeon:                     Edsel Petrin. Dalbert Batman, M.D., FACS  Assistant:                      OR staff  Operative Indications:   This is a healthy 48 year old gentleman, referred by Dr. Jenna Luo for persistent left inguinal and femoral adenopathy. 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 He has been evaluated by Dr. Simeon Craft such who has requested Port-A-Cath insertion. His left inguinal incision is healing normally today. I discussed the indications, details, techniques, and numerous risks of Port-A-Cath insertion with him and his wife. They're aware of the risk of bleeding, infection, vascular injury, port occlusion, pneumothorax, and other unforeseen problems. They understand all of these issues. All of their questions are answered. They agree with this plan.  Operative Findings:       The catheter was placed through the left subclavian vein. The catheter was functioning well in the operating room and appeared well-positioned by C-arm.  Procedure in Detail:          Following the induction of general LMA anesthesia the patient was positioned with his arms at his sides and a small roll behind his shoulders. The neck and chest were prepped and draped in a sterile fashion. Intravenous antibiotics were given. Surgical timeout was performed. 0.5% Marcaine with epinephrine was used as local infiltration anesthetic.    A left subclavian venipuncture was performed, with a single pass, and the guidewire inserted into the superior vena cava under fluoroscopic guidance. A small incision was made at the wire insertion  site. Using the C-arm I marked a template on the chest wall to help guide catheter length and positioning so the tip of the catheter would be in the superior vena cava above the right atrium. A short transverse incision was made in the left infraclavicular area below the wire insertion site. Subcutaneous pocket was created at the level of the pectoralis fascia.   Using a tunneling device I passed the catheter from the wire insertion site to the port pocket site. Using the template marks on the chest wall I then measured the catheter and cut it 24.5 cm in length. The catheter was secured to the port with the locking device and it was flushed with heparinized saline. The dilator and peel-away sheath assembly was inserted over the guidewire into the central venous circulation, the wire and dilator removed, and the catheter threaded into the peel-away sheath and the peel-away sheath removed. The catheter flushed easily and had excellent blood return. The catheter was flushed with concentrated heparin. With carm  we confirmed that the tip of the catheter was well-positioned in the distal superior vena cava near the right atrium and there is no deformity of the catheter anywhere along its course. I sutured the port to the pectoralis fascia with 3 interrupted sutures of 2-0  Prolene. Subcutaneous tissue was closed with 3-0 Vicryl sutures and the skin incisions closed with subcuticular 4-0 Monocryl and Dermabond. Patient tolerated the procedure well was taken to PACU in stable condition. EBL 10 mL. Counts correct. Competitions none.     Edsel Petrin. Dalbert Batman, M.D., FACS General and Minimally Invasive Surgery Breast and Colorectal Surgery  09/15/2014 11:55 AM

## 2014-09-16 ENCOUNTER — Ambulatory Visit (HOSPITAL_COMMUNITY)
Admission: RE | Admit: 2014-09-16 | Discharge: 2014-09-16 | Disposition: A | Discharge status: Home / Self Care | Payer: 59 | Source: Ambulatory Visit | Attending: Hematology and Oncology | Admitting: Hematology and Oncology

## 2014-09-16 ENCOUNTER — Other Ambulatory Visit: Payer: Self-pay | Admitting: Hematology and Oncology

## 2014-09-16 DIAGNOSIS — C8190 Hodgkin lymphoma, unspecified, unspecified site: Secondary | ICD-10-CM | POA: Diagnosis present

## 2014-09-16 DIAGNOSIS — E785 Hyperlipidemia, unspecified: Secondary | ICD-10-CM | POA: Diagnosis not present

## 2014-09-16 DIAGNOSIS — I059 Rheumatic mitral valve disease, unspecified: Secondary | ICD-10-CM

## 2014-09-16 NOTE — Progress Notes (Signed)
  Echocardiogram 2D Echocardiogram has been performed.  Hennessy Bartel FRANCES 09/16/2014, 11:59 AM

## 2014-09-17 ENCOUNTER — Telehealth: Payer: Self-pay | Admitting: *Deleted

## 2014-09-17 ENCOUNTER — Encounter (HOSPITAL_BASED_OUTPATIENT_CLINIC_OR_DEPARTMENT_OTHER): Payer: Self-pay | Admitting: General Surgery

## 2014-09-17 NOTE — Telephone Encounter (Signed)
Agree no need to hold celebrex for BM

## 2014-09-17 NOTE — Telephone Encounter (Signed)
Wife reports pt has some redness, flushing on his cheeks under his eyes.  Not itching and no fever.  He also c/o some light sensitivity to the fluorescent lights during his Echo yesterday.  She also states he was instructed to hold his Celebrex for [facility] a cath surgery two days ago.  His back is hurting and he wants to resume it.  She asks if he needs to hold the Celebrex for Bone Marrow Biopsy tomorrow.  Instructed wife pt does not need to hold it for BMBx but she may not want pt to take it for other reasons.  She says the hydrocodone does not help pt's back pain as well as the Celebrex does.

## 2014-09-17 NOTE — Telephone Encounter (Signed)
Opened note in error.

## 2014-09-17 NOTE — Telephone Encounter (Signed)
Left VM for wife informing ok w/ Dr. Alvy Bimler for pt to take Celebrex.  Arrive for [facility] tomorrow as scheduled at 7 am and nothing to eat or drink after midnight.  Please call back if any questions.

## 2014-09-18 ENCOUNTER — Ambulatory Visit (HOSPITAL_COMMUNITY)
Admission: RE | Admit: 2014-09-18 | Discharge: 2014-09-18 | Disposition: A | Discharge status: Home / Self Care | Payer: 59 | Source: Ambulatory Visit | Attending: Hematology and Oncology | Admitting: Hematology and Oncology

## 2014-09-18 ENCOUNTER — Encounter (HOSPITAL_COMMUNITY): Payer: Self-pay

## 2014-09-18 VITALS — BP 115/54 | HR 65 | Temp 97.8°F | Resp 16 | Ht 70.0 in | Wt 191.0 lb

## 2014-09-18 DIAGNOSIS — C8590 Non-Hodgkin lymphoma, unspecified, unspecified site: Secondary | ICD-10-CM | POA: Insufficient documentation

## 2014-09-18 DIAGNOSIS — C8190 Hodgkin lymphoma, unspecified, unspecified site: Secondary | ICD-10-CM

## 2014-09-18 LAB — CBC WITH DIFFERENTIAL/PLATELET
BASOS ABS: 0 10*3/uL (ref 0.0–0.1)
BASOS PCT: 0 % (ref 0–1)
EOS ABS: 0.1 10*3/uL (ref 0.0–0.7)
EOS PCT: 0 % (ref 0–5)
HCT: 39.5 % (ref 39.0–52.0)
Hemoglobin: 12.8 g/dL — ABNORMAL LOW (ref 13.0–17.0)
Lymphocytes Relative: 10 % — ABNORMAL LOW (ref 12–46)
Lymphs Abs: 1.9 10*3/uL (ref 0.7–4.0)
MCH: 27.7 pg (ref 26.0–34.0)
MCHC: 32.4 g/dL (ref 30.0–36.0)
MCV: 85.5 fL (ref 78.0–100.0)
Monocytes Absolute: 1.2 10*3/uL — ABNORMAL HIGH (ref 0.1–1.0)
Monocytes Relative: 7 % (ref 3–12)
Neutro Abs: 15.2 10*3/uL — ABNORMAL HIGH (ref 1.7–7.7)
Neutrophils Relative %: 83 % — ABNORMAL HIGH (ref 43–77)
PLATELETS: 287 10*3/uL (ref 150–400)
RBC: 4.62 MIL/uL (ref 4.22–5.81)
RDW: 13.6 % (ref 11.5–15.5)
WBC: 18.4 10*3/uL — ABNORMAL HIGH (ref 4.0–10.5)

## 2014-09-18 LAB — BONE MARROW EXAM

## 2014-09-18 MED ORDER — MIDAZOLAM HCL 5 MG/5ML IJ SOLN
INTRAMUSCULAR | Status: AC | PRN
  Administered 2014-09-18 (×3): 1 mg via INTRAVENOUS
  Administered 2014-09-18: 2 mg via INTRAVENOUS
  Administered 2014-09-18: 1 mg via INTRAVENOUS

## 2014-09-18 MED ORDER — FENTANYL CITRATE 0.05 MG/ML IJ SOLN
100.0000 ug | Freq: Once | INTRAMUSCULAR | Status: DC
  Filled 2014-09-18: qty 2

## 2014-09-18 MED ORDER — FENTANYL CITRATE 0.05 MG/ML IJ SOLN
INTRAMUSCULAR | Status: AC | PRN
  Administered 2014-09-18 (×3): 25 ug via INTRAVENOUS

## 2014-09-18 MED ORDER — SODIUM CHLORIDE 0.9 % IV SOLN
Freq: Once | INTRAVENOUS | Status: AC
  Administered 2014-09-18: 08:00:00 via INTRAVENOUS

## 2014-09-18 MED ORDER — MIDAZOLAM HCL 10 MG/2ML IJ SOLN
10.0000 mg | Freq: Once | INTRAMUSCULAR | Status: DC
  Filled 2014-09-18: qty 2

## 2014-09-18 NOTE — Discharge Instructions (Signed)

## 2014-09-18 NOTE — Sedation Documentation (Signed)
Bx site C, D , I

## 2014-09-18 NOTE — Sedation Documentation (Signed)
MD at bedside. 

## 2014-09-18 NOTE — Sedation Documentation (Addendum)
Procedure completed. Patient tolerated well. Rolled supine. BX site, C, D, I

## 2014-09-18 NOTE — Sedation Documentation (Signed)
Family updated as to patient's status.

## 2014-09-18 NOTE — Procedures (Signed)
Brief examination was performed. ENT: adequate airway clearance Heart: regular rate and rhythm.No Murmurs Lungs: clear to auscultation, no wheezes, normal respiratory effort  American Society of Anesthesiologists ASA scale 1  Mallampati Score of 1  Bone Marrow Biopsy and Aspiration Procedure Note   Informed consent was obtained and potential risks including bleeding, infection and pain were reviewed with the patient. I verified that the patient has been fasting since midnight.  The patient's name, date of birth, identification, consent and allergies were verified prior to the start of procedure and time out was performed.  A total of 7 mg of IV Versed and 75 mcg of IV fentanyl were given.  The right posterior iliac crest was chosen as the site of biopsy.  The skin was prepped with Betadine solution.   8 cc of 1% lidocaine was used to provide local anaesthesia.   10 cc of bone marrow aspirate was obtained followed by 1 inch biopsy.   The procedure was tolerated well and there were no complications.  The patient was stable at the end of the procedure.  Specimens sent for flow cytometry, cytogenetics and additional studies.

## 2014-09-18 NOTE — Sedation Documentation (Signed)
Beginning to arouse on own

## 2014-09-20 ENCOUNTER — Other Ambulatory Visit: Payer: Self-pay | Admitting: Family Medicine

## 2014-09-21 ENCOUNTER — Telehealth: Payer: Self-pay | Admitting: Hematology and Oncology

## 2014-09-21 ENCOUNTER — Telehealth: Payer: Self-pay | Admitting: *Deleted

## 2014-09-21 ENCOUNTER — Encounter: Payer: Self-pay | Admitting: *Deleted

## 2014-09-21 ENCOUNTER — Ambulatory Visit (HOSPITAL_BASED_OUTPATIENT_CLINIC_OR_DEPARTMENT_OTHER): Payer: 59 | Admitting: Hematology and Oncology

## 2014-09-21 ENCOUNTER — Encounter: Payer: Self-pay | Admitting: Hematology and Oncology

## 2014-09-21 VITALS — BP 120/61 | HR 69 | Temp 97.5°F | Resp 18 | Ht 70.0 in | Wt 190.7 lb

## 2014-09-21 DIAGNOSIS — C8190 Hodgkin lymphoma, unspecified, unspecified site: Secondary | ICD-10-CM

## 2014-09-21 DIAGNOSIS — D72829 Elevated white blood cell count, unspecified: Secondary | ICD-10-CM | POA: Insufficient documentation

## 2014-09-21 MED ORDER — ONDANSETRON HCL 8 MG PO TABS: 8.0000 mg | ORAL_TABLET | Freq: Three times a day (TID) | ORAL | Status: DC | PRN

## 2014-09-21 MED ORDER — LIDOCAINE-PRILOCAINE 2.5-2.5 % EX CREA: TOPICAL_CREAM | CUTANEOUS | Status: DC

## 2014-09-21 MED ORDER — PROCHLORPERAZINE MALEATE 10 MG PO TABS: 10.0000 mg | ORAL_TABLET | Freq: Four times a day (QID) | ORAL | Status: DC | PRN

## 2014-09-21 NOTE — Assessment & Plan Note (Signed)
Likely stress response. Will observe

## 2014-09-21 NOTE — Telephone Encounter (Signed)
Per staff message and POF I have scheduled appts. Advised scheduler of appts. JMW  

## 2014-09-21 NOTE — Progress Notes (Signed)
[facility] OFFICE PROGRESS NOTE  Patient Care Team: [name], MD as PCP - General (Family Medicine)  SUMMARY OF ONCOLOGIC HISTORY: Oncology History   Hodgkin lymphoma   Staging form: Lymphoid Neoplasms, AJCC 6th Edition     Clinical stage from 09/13/2014: Stage III - Signed by [name], MD on 09/13/2014       Hodgkin lymphoma   09/04/2014 Pathology Results Accession: [id number] inguinal lymph node biopsy confirmed Hodgkin lymphoma   09/04/2014 Procedure Dr. Dalbert Batman performed excision of left inguinal lymph node biopsy   09/11/2014 Imaging CT scan of the chest, abdomen and pelvis show disease both above and below the diaphragm   09/16/2014 Imaging Echocardiogram is negative   09/18/2014 Bone Marrow Biopsy Bone marrow aspirate and biopsy is negative    INTERVAL HISTORY: Please see below for problem oriented charting. He returns to discuss test results. In the meantime he denies any new complaints  REVIEW OF SYSTEMS:   Constitutional: Denies fevers, chills or abnormal weight loss Eyes: Denies blurriness of vision Ears, nose, mouth, throat, and face: Denies mucositis or sore throat Respiratory: Denies cough, dyspnea or wheezes Cardiovascular: Denies palpitation, chest discomfort or lower extremity swelling Gastrointestinal:  Denies nausea, heartburn or change in bowel habits Skin: Denies abnormal skin rashes Lymphatics: Denies new lymphadenopathy or easy bruising Neurological:Denies numbness, tingling or new weaknesses Behavioral/Psych: Mood is stable, no new changes  All other systems were reviewed with the patient and are negative.  I have reviewed the past medical history, past surgical history, social history and family history with the patient and they are unchanged from previous note.  ALLERGIES:  has No Known Allergies.  MEDICATIONS:  Current Outpatient Prescriptions  Medication Sig Dispense Refill  . acetaminophen (TYLENOL) 500 MG chewable  tablet Chew 500 mg by mouth every 6 (six) hours as needed for pain.    . celecoxib (CELEBREX) 200 MG capsule TAKE 1 CAPSULE (200 MG TOTAL) BY MOUTH DAILY. 30 capsule 5  . docusate sodium (COLACE) 100 MG capsule Take 100 mg by mouth 2 (two) times daily as needed for mild constipation.    . pravastatin (PRAVACHOL) 20 MG tablet TAKE 1 TABLET BY MOUTH AT BEDTIME 30 tablet 5  . celecoxib (CELEBREX) 200 MG capsule TAKE 1 CAPSULE (200 MG TOTAL) BY MOUTH DAILY. 30 capsule 5  . dexlansoprazole (DEXILANT) 60 MG capsule Take 1 capsule (60 mg total) by mouth daily. (Patient not taking: Reported on 09/21/2014) 30 capsule 11  . HYDROcodone-acetaminophen (NORCO) 5-325 MG per tablet Take 1-2 tablets by mouth every 6 (six) hours as needed for moderate pain or severe pain. (Patient not taking: Reported on 09/21/2014) 30 tablet 0  . lidocaine-prilocaine (EMLA) cream Apply to affected area once 30 g 3  . ondansetron (ZOFRAN) 8 MG tablet Take 1 tablet (8 mg total) by mouth every 8 (eight) hours as needed for nausea or vomiting. 30 tablet 1  . prochlorperazine (COMPAZINE) 10 MG tablet Take 1 tablet (10 mg total) by mouth every 6 (six) hours as needed (Nausea or vomiting). 30 tablet 1   No current facility-administered medications for this visit.    PHYSICAL EXAMINATION: ECOG PERFORMANCE STATUS: 0 - Asymptomatic  Filed Vitals:   09/21/14 1057  BP: 120/61  Pulse: 69  Temp: 97.5 F (36.4 C)  Resp: 18   Filed Weights   09/21/14 1057  Weight: 190 lb 11.2 oz (86.501 kg)    GENERAL:alert, no distress and comfortable SKIN: skin color, texture, turgor are normal,  no rashes or significant lesions EYES: normal, Conjunctiva are pink and non-injected, sclera clear Musculoskeletal:no cyanosis of digits and no clubbing. Port site looks ok NEURO: alert & oriented x 3 with fluent speech, no focal motor/sensory deficits  LABORATORY DATA:  I have reviewed the data as listed    Component Value Date/Time   NA 141  09/11/2014 0808   K 4.6 09/11/2014 0808   CL 99 09/11/2014 0808   CO2 28 09/11/2014 0808   GLUCOSE 144* 09/11/2014 0808   BUN 14 09/11/2014 0808   CREATININE 1.00 09/11/2014 0808   CALCIUM 10.0 09/11/2014 0808   PROT 7.7 09/11/2014 0808   ALBUMIN 3.7 09/11/2014 0808   AST 39* 09/11/2014 0808   ALT 36 09/11/2014 0808   ALKPHOS 157* 09/11/2014 0808   BILITOT 0.5 09/11/2014 0808   GFRNONAA 89 09/11/2014 0808   GFRAA >89 09/11/2014 0808    No results found for: SPEP, UPEP  Lab Results  Component Value Date   WBC 18.4* 09/18/2014   NEUTROABS 15.2* 09/18/2014   HGB 12.8* 09/18/2014   HCT 39.5 09/18/2014   MCV 85.5 09/18/2014   PLT 287 09/18/2014      Chemistry      Component Value Date/Time   NA 141 09/11/2014 0808   K 4.6 09/11/2014 0808   CL 99 09/11/2014 0808   CO2 28 09/11/2014 0808   BUN 14 09/11/2014 0808   CREATININE 1.00 09/11/2014 0808      Component Value Date/Time   CALCIUM 10.0 09/11/2014 0808   ALKPHOS 157* 09/11/2014 0808   AST 39* 09/11/2014 0808   ALT 36 09/11/2014 0808   BILITOT 0.5 09/11/2014 6761       RADIOGRAPHIC STUDIES:I reviewed the imaging with him and his wife I have personally reviewed the radiological images as listed and agreed with the findings in the report.  ASSESSMENT & PLAN:  Hodgkin lymphoma We discussed the role of chemotherapy. The intent is for cure.  We discussed some of the risks, benefits and side-effects of Bleomycin, Adriamycin, Vinblastine and Dacarbazine.   Some of the short term side-effects included, though not limited to, risk of fatigue, weight loss, tumor lysis syndrome, risk of allergic reactions, pancytopenia, loss of pulmonary function, life-threatening infections, need for transfusions of blood products, nausea, vomiting, change in bowel habits, hair loss, risk of congestive heart failure, admission to hospital for various reasons, and risks of death.   Long term side-effects are also discussed including  permanent damage to nerve function, chronic fatigue, and rare secondary malignancy including bone marrow disorders.   The patient is aware that the response rates discussed earlier is not guaranteed.    After a long discussion, patient made an informed decision to proceed with the prescribed plan of care.   Patient education material was dispensed   Leukocytosis Likely stress response. Will observe   Orders Placed This Encounter  Procedures  . CBC with Differential    Standing Status: Standing     Number of Occurrences: 20     Standing Expiration Date: 09/22/2015  . Comprehensive metabolic panel    Standing Status: Standing     Number of Occurrences: 20     Standing Expiration Date: 09/22/2015  . PHYSICIAN COMMUNICATION ORDER    Baseline Pulmonary Function Tests should be obtained prior to initiation of Bleomycin.  Marland Kitchen PHYSICIAN COMMUNICATION ORDER    A baseline Echo/ [place] should be obtained prior to initiation of Anthracycline Chemotherapy   All questions were answered. The patient  knows to call the clinic with any problems, questions or concerns. No barriers to learning was detected. I spent 40 minutes counseling the patient face to face. The total time spent in the appointment was 55 minutes and more than 50% was on counseling and review of test results     [facility], [place], MD 09/21/2014 8:17 PM

## 2014-09-21 NOTE — Progress Notes (Signed)
FMLA paperwork for pt's wife, Keith Forbes given to [name] in [place].Marland Kitchen

## 2014-09-21 NOTE — Telephone Encounter (Signed)
Gave avs & cal for Jan. Sent mess to sch tx. °

## 2014-09-21 NOTE — Assessment & Plan Note (Signed)
We discussed the role of chemotherapy. The intent is for cure.  We discussed some of the risks, benefits and side-effects of Bleomycin, Adriamycin, Vinblastine and Dacarbazine.   Some of the short term side-effects included, though not limited to, risk of fatigue, weight loss, tumor lysis syndrome, risk of allergic reactions, pancytopenia, loss of pulmonary function, life-threatening infections, need for transfusions of blood products, nausea, vomiting, change in bowel habits, hair loss, risk of congestive heart failure, admission to hospital for various reasons, and risks of death.   Long term side-effects are also discussed including permanent damage to nerve function, chronic fatigue, and rare secondary malignancy including bone marrow disorders.   The patient is aware that the response rates discussed earlier is not guaranteed.    After a long discussion, patient made an informed decision to proceed with the prescribed plan of care.   Patient education material was dispensed

## 2014-09-21 NOTE — Telephone Encounter (Signed)
Call from [employer] at [facility] regarding referral for Lymphoma. She needs some medical records sent over for the referral.  Her phone [phone].  S/w Nicki in our HIM dept and requested she Higher education careers adviser at [employer] and send over needed records.

## 2014-09-22 ENCOUNTER — Other Ambulatory Visit: Payer: Self-pay | Admitting: Hematology and Oncology

## 2014-09-22 ENCOUNTER — Telehealth: Payer: Self-pay | Admitting: Family Medicine

## 2014-09-22 ENCOUNTER — Ambulatory Visit (HOSPITAL_BASED_OUTPATIENT_CLINIC_OR_DEPARTMENT_OTHER): Payer: 59

## 2014-09-22 DIAGNOSIS — Z5111 Encounter for antineoplastic chemotherapy: Secondary | ICD-10-CM

## 2014-09-22 DIAGNOSIS — C8190 Hodgkin lymphoma, unspecified, unspecified site: Secondary | ICD-10-CM

## 2014-09-22 MED ORDER — DOXORUBICIN HCL CHEMO IV INJECTION 2 MG/ML
25.0000 mg/m2 | Freq: Once | INTRAVENOUS | Status: AC
  Administered 2014-09-22: 52 mg via INTRAVENOUS
  Filled 2014-09-22: qty 26

## 2014-09-22 MED ORDER — ONDANSETRON 16 MG/50ML IVPB (CHCC)
16.0000 mg | Freq: Once | INTRAVENOUS | Status: AC
  Administered 2014-09-22: 16 mg via INTRAVENOUS

## 2014-09-22 MED ORDER — SODIUM CHLORIDE 0.9 % IV SOLN
Freq: Once | INTRAVENOUS | Status: AC
  Administered 2014-09-22: 13:00:00 via INTRAVENOUS

## 2014-09-22 MED ORDER — HEPARIN SOD (PORK) LOCK FLUSH 100 UNIT/ML IV SOLN
500.0000 [IU] | Freq: Once | INTRAVENOUS | Status: AC | PRN
  Administered 2014-09-22: 500 [IU]
  Filled 2014-09-22: qty 5

## 2014-09-22 MED ORDER — SODIUM CHLORIDE 0.9 % IV SOLN
375.0000 mg/m2 | Freq: Once | INTRAVENOUS | Status: AC
  Administered 2014-09-22: 780 mg via INTRAVENOUS
  Filled 2014-09-22: qty 39

## 2014-09-22 MED ORDER — SODIUM CHLORIDE 0.9 % IV SOLN
10.0000 [IU]/m2 | Freq: Once | INTRAVENOUS | Status: AC
  Administered 2014-09-22: 21 [IU] via INTRAVENOUS
  Filled 2014-09-22: qty 7

## 2014-09-22 MED ORDER — DEXAMETHASONE SODIUM PHOSPHATE 20 MG/5ML IJ SOLN
20.0000 mg | Freq: Once | INTRAMUSCULAR | Status: AC
  Administered 2014-09-22: 20 mg via INTRAVENOUS

## 2014-09-22 MED ORDER — VINBLASTINE SULFATE CHEMO INJECTION 1 MG/ML
5.8000 mg/m2 | Freq: Once | INTRAVENOUS | Status: AC
  Administered 2014-09-22: 12 mg via INTRAVENOUS
  Filled 2014-09-22: qty 12

## 2014-09-22 MED ORDER — SODIUM CHLORIDE 0.9 % IJ SOLN
10.0000 mL | INTRAMUSCULAR | Status: DC | PRN
  Administered 2014-09-22: 10 mL
  Filled 2014-09-22: qty 10

## 2014-09-22 MED ORDER — DEXAMETHASONE SODIUM PHOSPHATE 20 MG/5ML IJ SOLN
INTRAMUSCULAR | Status: AC
  Filled 2014-09-22: qty 5

## 2014-09-22 MED ORDER — ONDANSETRON 16 MG/50ML IVPB (CHCC)
INTRAVENOUS | Status: AC
  Filled 2014-09-22: qty 16

## 2014-09-22 NOTE — Telephone Encounter (Signed)
Patients wife called to say that when he was in we told him we were going to call him in a med for his reflux, she went to pharmacy to get this and it was not there please call something in to [facility] if possible  [phone]

## 2014-09-22 NOTE — Patient Instructions (Signed)
[facility] Discharge Instructions for Patients Receiving Chemotherapy  Today you received the following chemotherapy agents: Adriamycin, Vinblastine, Bleomycin, and Dacarbazine.  To help prevent nausea and vomiting after your treatment, we encourage you to take your nausea medication as prescribed.   If you develop nausea and vomiting that is not controlled by your nausea medication, call the clinic.   BELOW ARE SYMPTOMS THAT SHOULD BE REPORTED IMMEDIATELY:  *FEVER GREATER THAN 100.5 F  *CHILLS WITH OR WITHOUT FEVER  NAUSEA AND VOMITING THAT IS NOT CONTROLLED WITH YOUR NAUSEA MEDICATION  *UNUSUAL SHORTNESS OF BREATH  *UNUSUAL BRUISING OR BLEEDING  TENDERNESS IN MOUTH AND THROAT WITH OR WITHOUT PRESENCE OF ULCERS  *URINARY PROBLEMS  *BOWEL PROBLEMS  UNUSUAL RASH Items with * indicate a potential emergency and should be followed up as soon as possible.  Feel free to call the clinic you have any questions or concerns. The clinic phone number is [phone].

## 2014-09-23 ENCOUNTER — Telehealth: Payer: Self-pay | Admitting: *Deleted

## 2014-09-23 MED ORDER — DEXLANSOPRAZOLE 60 MG PO CPDR: 60.0000 mg | DELAYED_RELEASE_CAPSULE | Freq: Every day | ORAL | Status: DC

## 2014-09-23 NOTE — Telephone Encounter (Signed)
-----   Message from Braulio Bosch, RN sent at 09/22/2014  2:15 PM EST ----- Regarding: Chemo Follow up Call Contact: [phone] First time [facility]. Dr. Alvy Bimler.  Pt states call home number listed above.

## 2014-09-23 NOTE — Telephone Encounter (Signed)
Called Keith Forbes for chemotherapy F/U.  Patient is "doing okay".  Denies n/v.  Denies any new side effects or symptoms but "face is warm, no redness or fever".  Bowel and bladder is functioning well with "lbm this morning and urine color has cleared back to normal.  Yesterday I experienced  Tingling like needles in my skin.  It comes and goes to my arms and face.  Today it's happened once mid morning."  Eating and drinking well and I instructed to drink 64 oz minimum daily or at least the day before, of and after treatment.  Denies questions at this time and encouraged to call if needed.  Reviewed how to call after hours in the case of an emergency.

## 2014-09-23 NOTE — Telephone Encounter (Signed)
PPI pt was previously taken was sent to [facility]

## 2014-09-24 ENCOUNTER — Encounter (HOSPITAL_COMMUNITY): Payer: Self-pay | Admitting: Emergency Medicine

## 2014-09-24 ENCOUNTER — Emergency Department (HOSPITAL_COMMUNITY): Payer: 59

## 2014-09-24 ENCOUNTER — Emergency Department (HOSPITAL_COMMUNITY)
Admission: EM | Admit: 2014-09-24 | Discharge: 2014-09-24 | Disposition: A | Discharge status: Home / Self Care | Payer: 59 | Attending: Emergency Medicine | Admitting: Emergency Medicine

## 2014-09-24 DIAGNOSIS — K219 Gastro-esophageal reflux disease without esophagitis: Secondary | ICD-10-CM | POA: Insufficient documentation

## 2014-09-24 DIAGNOSIS — Z79899 Other long term (current) drug therapy: Secondary | ICD-10-CM | POA: Diagnosis not present

## 2014-09-24 DIAGNOSIS — Z8739 Personal history of other diseases of the musculoskeletal system and connective tissue: Secondary | ICD-10-CM | POA: Diagnosis not present

## 2014-09-24 DIAGNOSIS — E780 Pure hypercholesterolemia: Secondary | ICD-10-CM | POA: Diagnosis not present

## 2014-09-24 DIAGNOSIS — Z791 Long term (current) use of non-steroidal anti-inflammatories (NSAID): Secondary | ICD-10-CM | POA: Insufficient documentation

## 2014-09-24 DIAGNOSIS — R509 Fever, unspecified: Secondary | ICD-10-CM

## 2014-09-24 DIAGNOSIS — C8190 Hodgkin lymphoma, unspecified, unspecified site: Secondary | ICD-10-CM | POA: Diagnosis not present

## 2014-09-24 DIAGNOSIS — E785 Hyperlipidemia, unspecified: Secondary | ICD-10-CM | POA: Diagnosis not present

## 2014-09-24 LAB — I-STAT CG4 LACTIC ACID, ED: Lactic Acid, Venous: 1.29 mmol/L (ref 0.5–2.2)

## 2014-09-24 LAB — CBC WITH DIFFERENTIAL/PLATELET
BASOS ABS: 0 10*3/uL (ref 0.0–0.1)
BASOS PCT: 0 % (ref 0–1)
EOS ABS: 0 10*3/uL (ref 0.0–0.7)
Eosinophils Relative: 0 % (ref 0–5)
HEMATOCRIT: 37.1 % — AB (ref 39.0–52.0)
HEMOGLOBIN: 11.9 g/dL — AB (ref 13.0–17.0)
Lymphocytes Relative: 9 % — ABNORMAL LOW (ref 12–46)
Lymphs Abs: 1.2 10*3/uL (ref 0.7–4.0)
MCH: 27.1 pg (ref 26.0–34.0)
MCHC: 32.1 g/dL (ref 30.0–36.0)
MCV: 84.5 fL (ref 78.0–100.0)
MONO ABS: 0.3 10*3/uL (ref 0.1–1.0)
MONOS PCT: 2 % — AB (ref 3–12)
NEUTROS ABS: 12.1 10*3/uL — AB (ref 1.7–7.7)
Neutrophils Relative %: 89 % — ABNORMAL HIGH (ref 43–77)
Platelets: 157 10*3/uL (ref 150–400)
RBC: 4.39 MIL/uL (ref 4.22–5.81)
RDW: 13.4 % (ref 11.5–15.5)
WBC: 13.5 10*3/uL — ABNORMAL HIGH (ref 4.0–10.5)

## 2014-09-24 LAB — COMPREHENSIVE METABOLIC PANEL
ALBUMIN: 3.5 g/dL (ref 3.5–5.2)
ALT: 30 U/L (ref 0–53)
ANION GAP: 9 (ref 5–15)
AST: 29 U/L (ref 0–37)
Alkaline Phosphatase: 147 U/L — ABNORMAL HIGH (ref 39–117)
BILIRUBIN TOTAL: 0.9 mg/dL (ref 0.3–1.2)
BUN: 23 mg/dL (ref 6–23)
CO2: 25 mmol/L (ref 19–32)
CREATININE: 0.89 mg/dL (ref 0.50–1.35)
Calcium: 8.8 mg/dL (ref 8.4–10.5)
Chloride: 102 mEq/L (ref 96–112)
GFR calc Af Amer: >90 mL/min (ref >90)
Glucose, Bld: 112 mg/dL — ABNORMAL HIGH (ref 70–99)
POTASSIUM: 3.7 mmol/L (ref 3.5–5.1)
Sodium: 136 mmol/L (ref 135–145)
Total Protein: 7.4 g/dL (ref 6.0–8.3)

## 2014-09-24 LAB — URINALYSIS, ROUTINE W REFLEX MICROSCOPIC
BILIRUBIN URINE: NEGATIVE
GLUCOSE, UA: NEGATIVE mg/dL
HGB URINE DIPSTICK: NEGATIVE
KETONES UR: NEGATIVE mg/dL
Leukocytes, UA: NEGATIVE
Nitrite: NEGATIVE
PROTEIN: NEGATIVE mg/dL
Specific Gravity, Urine: 1.023 (ref 1.005–1.030)
UROBILINOGEN UA: 1 mg/dL (ref 0.0–1.0)
pH: 5.5 (ref 5.0–8.0)

## 2014-09-24 LAB — LIPASE, BLOOD: LIPASE: 20 U/L (ref 11–59)

## 2014-09-24 MED ORDER — SODIUM CHLORIDE 0.9 % IV BOLUS (SEPSIS)
1000.0000 mL | Freq: Once | INTRAVENOUS | Status: AC
  Administered 2014-09-24: 1000 mL via INTRAVENOUS

## 2014-09-24 MED ORDER — HEPARIN SOD (PORK) LOCK FLUSH 100 UNIT/ML IV SOLN
500.0000 [IU] | Freq: Once | INTRAVENOUS | Status: AC
  Administered 2014-09-24: 500 [IU]
  Filled 2014-09-24: qty 5

## 2014-09-24 MED ORDER — ACETAMINOPHEN 500 MG PO TABS
1000.0000 mg | ORAL_TABLET | Freq: Once | ORAL | Status: AC
  Administered 2014-09-24: 1000 mg via ORAL
  Filled 2014-09-24: qty 2

## 2014-09-24 NOTE — ED Notes (Signed)
Pt reports fever and chills starting today. Pt reports slight nausea but denies other symptoms or pain.

## 2014-09-24 NOTE — ED Notes (Signed)
Pt void prior to arrival. Pt receiving bolus of fluid at present time. Will reassess.

## 2014-09-24 NOTE — ED Notes (Signed)
Awake. Verbally responsive. A/O x4. Resp even and unlabored. No audible adventitious breath sounds noted. ABC's intact. NAD noted. 

## 2014-09-24 NOTE — ED Notes (Signed)
Pt unable to void at present time. Pt reports void just prior to arrival.

## 2014-09-24 NOTE — Discharge Instructions (Signed)
As discussed, your evaluation today has been largely reassuring.  But, it is important that you monitor your condition carefully, and do not hesitate to return to the ED if you develop new, or concerning changes in your condition.  Otherwise, please follow-up with your physician for appropriate ongoing care.   Fever, Adult A fever is a higher than normal body temperature. In an adult, an oral temperature around 98.6 F (37 C) is considered normal. A temperature of 100.4 F (38 C) or higher is generally considered a fever. Mild or moderate fevers generally have no long-term effects and often do not require treatment. Extreme fever (greater than or equal to 106 F or 41.1 C) can cause seizures. The sweating that may occur with repeated or prolonged fever may cause dehydration. Elderly people can develop confusion during a fever. A measured temperature can vary with:  Age.  Time of day.  Method of measurement (mouth, underarm, rectal, or ear). The fever is confirmed by taking a temperature with a thermometer. Temperatures can be taken different ways. Some methods are accurate and some are not.  An oral temperature is used most commonly. Electronic thermometers are fast and accurate.  An ear temperature will only be accurate if the thermometer is positioned as recommended by the manufacturer.  A rectal temperature is accurate and done for those adults who have a condition where an oral temperature cannot be taken.  An underarm (axillary) temperature is not accurate and not recommended. Fever is a symptom, not a disease.  CAUSES   Infections commonly cause fever.  Some noninfectious causes for fever include:  Some arthritis conditions.  Some thyroid or adrenal gland conditions.  Some immune system conditions.  Some types of cancer.  A medicine reaction.  High doses of certain street drugs such as methamphetamine.  Dehydration.  Exposure to high outside or room  temperatures.  Occasionally, the source of a fever cannot be determined. This is sometimes called a "fever of unknown origin" (FUO).  Some situations may lead to a temporary rise in body temperature that may go away on its own. Examples are:  Childbirth.  Surgery.  Intense exercise. HOME CARE INSTRUCTIONS   Take appropriate medicines for fever. Follow dosing instructions carefully. If you use acetaminophen to reduce the fever, be careful to avoid taking other medicines that also contain acetaminophen. Do not take aspirin for a fever if you are younger than age 58. There is an association with Reye's syndrome. Reye's syndrome is a rare but potentially deadly disease.  If an infection is present and antibiotics have been prescribed, take them as directed. Finish them even if you start to feel better.  Rest as needed.  Maintain an adequate fluid intake. To prevent dehydration during an illness with prolonged or recurrent fever, you may need to drink extra fluid.Drink enough fluids to keep your urine clear or pale yellow.  Sponging or bathing with room temperature water may help reduce body temperature. Do not use ice water or alcohol sponge baths.  Dress comfortably, but do not over-bundle. SEEK MEDICAL CARE IF:   You are unable to keep fluids down.  You develop vomiting or diarrhea.  You are not feeling at least partly better after 3 days.  You develop new symptoms or problems. SEEK IMMEDIATE MEDICAL CARE IF:   You have shortness of breath or trouble breathing.  You develop excessive weakness.  You are dizzy or you faint.  You are extremely thirsty or you are making little or no  urine.  You develop new pain that was not there before (such as in the head, neck, chest, back, or abdomen).  You have persistent vomiting and diarrhea for more than 1 to 2 days.  You develop a stiff neck or your eyes become sensitive to light.  You develop a skin rash.  You have a fever or  persistent symptoms for more than 2 to 3 days.  You have a fever and your symptoms suddenly get worse. MAKE SURE YOU:   Understand these instructions.  Will watch your condition.  Will get help right away if you are not doing well or get worse. Document Released: 03/14/2001 Document Revised: 02/02/2014 Document Reviewed: 07/20/2011 [facility] Patient Information 2015 [place], [place]. This information is not intended to replace advice given to you by your health care provider. Make sure you discuss any questions you have with your health care provider.

## 2014-09-24 NOTE — ED Notes (Signed)
De-accessed porta-cath using clean technique. Pt tolerated well.

## 2014-09-24 NOTE — ED Notes (Signed)
Awake. Verbally responsive. A/O x4. Resp even and unlabored. No audible adventitious breath sounds noted. ABC's intact.  

## 2014-09-24 NOTE — ED Provider Notes (Signed)
CSN: [id number]     Arrival date & time 09/24/14  1658 History   First MD Initiated Contact with Patient 09/24/14 1701     Chief Complaint  Patient presents with  . Fever     HPI  Patient presents with concern of fever. Patient felt tired over the past 24 hours, approximately 5 hours ago the down for a nap.  He awoke, diaphoretic, and cutaneous measurement of temperature was 101+. He denies other new changes, such as nausea, chest pain, cough rash, urinary changes.  Patient's history is notable for recent diagnosis of Hodgkin's lymphoma, which she started chemotherapy 2 days ago.   Past Medical History  Diagnosis Date  . Hernia   . Hyperlipidemia   . Hypercholesteremia   . Pars defect of lumbar spine     bilateral 2013 (Dr. Sherwood Gambler)  . Cancer     hodgkin lymphoma  . GERD (gastroesophageal reflux disease)   . Hodgkin lymphoma 09/11/2014  . Wears contact lenses   . Lymphoma    Past Surgical History  Procedure Laterality Date  . Lymph node biopsy  12/15  . Portacath placement Left 09/15/2014    Procedure: INSERTION PORT-A-CATH WITH ULTRA SOUND;  Surgeon: Fanny Skates, MD;  Location: [facility];  Service: General;  Laterality: Left;   Family History  Problem Relation Age of Onset  . Hyperlipidemia Father   . Heart disease Father   . Cancer Paternal Aunt     breast ca   History  Substance Use Topics  . Smoking status: Never Smoker   . Smokeless tobacco: Never Used  . Alcohol Use: Yes    Review of Systems  Constitutional:       Per HPI, otherwise negative  HENT:       Per HPI, otherwise negative  Respiratory:       Per HPI, otherwise negative  Cardiovascular:       Per HPI, otherwise negative  Gastrointestinal: Negative for vomiting.  Endocrine:       Negative aside from HPI  Genitourinary:       Neg aside from HPI   Musculoskeletal:       Per HPI, otherwise negative  Skin: Negative.   Neurological: Negative for syncope.       Allergies  Review of patient's allergies indicates no known allergies.  Home Medications   Prior to Admission medications   Medication Sig Start Date End Date Taking? Authorizing Provider  acetaminophen (TYLENOL) 500 MG chewable tablet Chew 500 mg by mouth every 6 (six) hours as needed for pain.    Historical Provider, MD  celecoxib (CELEBREX) 200 MG capsule TAKE 1 CAPSULE (200 MG TOTAL) BY MOUTH DAILY. 03/26/14   [name], MD  celecoxib (CELEBREX) 200 MG capsule TAKE 1 CAPSULE (200 MG TOTAL) BY MOUTH DAILY. 09/21/14   [name], MD  dexlansoprazole (DEXILANT) 60 MG capsule Take 1 capsule (60 mg total) by mouth daily. 09/23/14   [name], MD  docusate sodium (COLACE) 100 MG capsule Take 100 mg by mouth 2 (two) times daily as needed for mild constipation.    Historical Provider, MD  HYDROcodone-acetaminophen (NORCO) 5-325 MG per tablet Take 1-2 tablets by mouth every 6 (six) hours as needed for moderate pain or severe pain. Patient not taking: Reported on 09/21/2014 09/15/14   Fanny Skates, MD  lidocaine-prilocaine (EMLA) cream Apply to affected area once 09/21/14   [name], MD  ondansetron (ZOFRAN) 8 MG tablet Take 1 tablet (  8 mg total) by mouth every 8 (eight) hours as needed for nausea or vomiting. 09/21/14   [name], MD  pravastatin (PRAVACHOL) 20 MG tablet TAKE 1 TABLET BY MOUTH AT BEDTIME 03/26/14   [name], MD  prochlorperazine (COMPAZINE) 10 MG tablet Take 1 tablet (10 mg total) by mouth every 6 (six) hours as needed (Nausea or vomiting). 09/21/14   Ni Gorsuch, MD   BP 126/63 mmHg  Pulse 88  Temp(Src) 100.8 F (38.2 C) (Oral)  Resp 18  SpO2 99% Physical Exam  Constitutional: He is oriented to person, place, and time. He appears well-developed. No distress.  HENT:  Head: Normocephalic and atraumatic.  Eyes: Conjunctivae and EOM are normal.  Cardiovascular: Normal rate and regular rhythm.   Pulmonary/Chest: Effort normal. No stridor.  No respiratory distress.  Left upper chest chemotherapy port without overlying erythema, no drainage.  Abdominal: He exhibits no distension.  Musculoskeletal: He exhibits no edema.  Neurological: He is alert and oriented to person, place, and time.  Skin: Skin is warm and dry.  Psychiatric: He has a normal mood and affect.  Nursing note and vitals reviewed.   ED Course  Procedures (including critical care time) Labs Review Labs Reviewed  COMPREHENSIVE METABOLIC PANEL - Abnormal; Notable for the following:    Glucose, Bld 112 (*)    Alkaline Phosphatase 147 (*)    All other components within normal limits  CBC WITH DIFFERENTIAL - Abnormal; Notable for the following:    WBC 13.5 (*)    Hemoglobin 11.9 (*)    HCT 37.1 (*)    Neutrophils Relative % 89 (*)    Neutro Abs 12.1 (*)    Lymphocytes Relative 9 (*)    Monocytes Relative 2 (*)    All other components within normal limits  LIPASE, BLOOD  URINALYSIS, ROUTINE W REFLEX MICROSCOPIC  I-STAT CG4 LACTIC ACID, ED    Imaging Review Dg Chest 2 View  09/24/2014   CLINICAL DATA:  High fever, Hodgkin's lymphoma, hyperlipidemia  EXAM: CHEST  2 VIEW  COMPARISON:  09/15/2014  FINDINGS: LEFT subclavian Port-A-Cath with tip in SVC.  Normal heart size, mediastinal contours, and pulmonary vascularity.  Lungs clear.  No pleural effusion or pneumothorax.  Bones unremarkable.  IMPRESSION: Normal exam.   Electronically Signed   By: Lavonia Dana M.D.   On: 09/24/2014 17:55  8:02 PM On repeat exam the patient is awake and alert. Patient is afebrile, 98.8. The patient his wife and I discussed all findings, return precautions, follow-up instructions. MDM  This patient with recent initiation of chemotherapy presents with mild fever. Patient's fever resolved with Tylenol, and he improves with IV fluids. No evidence for bacteremia, sepsis, nor focal source of infection. The resolution of fever, his improved condition, he was discharged in stable  condition.    Carmin Muskrat, MD 09/24/14 2003

## 2014-09-29 LAB — CHROMOSOME ANALYSIS, BONE MARROW

## 2014-10-01 ENCOUNTER — Encounter (HOSPITAL_COMMUNITY): Payer: Self-pay

## 2014-10-06 ENCOUNTER — Ambulatory Visit (HOSPITAL_BASED_OUTPATIENT_CLINIC_OR_DEPARTMENT_OTHER): Payer: 59

## 2014-10-06 ENCOUNTER — Ambulatory Visit: Payer: 59

## 2014-10-06 ENCOUNTER — Ambulatory Visit (HOSPITAL_BASED_OUTPATIENT_CLINIC_OR_DEPARTMENT_OTHER): Payer: 59 | Admitting: Hematology and Oncology

## 2014-10-06 ENCOUNTER — Telehealth: Payer: Self-pay | Admitting: *Deleted

## 2014-10-06 ENCOUNTER — Telehealth: Payer: Self-pay | Admitting: Hematology and Oncology

## 2014-10-06 ENCOUNTER — Other Ambulatory Visit (HOSPITAL_BASED_OUTPATIENT_CLINIC_OR_DEPARTMENT_OTHER): Payer: 59

## 2014-10-06 VITALS — BP 108/55 | HR 56 | Temp 98.3°F | Resp 18 | Ht 70.0 in | Wt 186.3 lb

## 2014-10-06 DIAGNOSIS — D701 Agranulocytosis secondary to cancer chemotherapy: Secondary | ICD-10-CM

## 2014-10-06 DIAGNOSIS — C8190 Hodgkin lymphoma, unspecified, unspecified site: Secondary | ICD-10-CM

## 2014-10-06 DIAGNOSIS — G620 Drug-induced polyneuropathy: Secondary | ICD-10-CM

## 2014-10-06 DIAGNOSIS — Z95828 Presence of other vascular implants and grafts: Secondary | ICD-10-CM

## 2014-10-06 DIAGNOSIS — Z452 Encounter for adjustment and management of vascular access device: Secondary | ICD-10-CM

## 2014-10-06 DIAGNOSIS — T451X5A Adverse effect of antineoplastic and immunosuppressive drugs, initial encounter: Principal | ICD-10-CM

## 2014-10-06 DIAGNOSIS — R748 Abnormal levels of other serum enzymes: Secondary | ICD-10-CM

## 2014-10-06 DIAGNOSIS — R509 Fever, unspecified: Secondary | ICD-10-CM

## 2014-10-06 DIAGNOSIS — D72818 Other decreased white blood cell count: Secondary | ICD-10-CM

## 2014-10-06 DIAGNOSIS — D72819 Decreased white blood cell count, unspecified: Secondary | ICD-10-CM

## 2014-10-06 LAB — CBC WITH DIFFERENTIAL/PLATELET
BASO%: 2.4 % — ABNORMAL HIGH (ref 0.0–2.0)
BASOS ABS: 0.1 10*3/uL (ref 0.0–0.1)
EOS%: 3.7 % (ref 0.0–7.0)
Eosinophils Absolute: 0.1 10*3/uL (ref 0.0–0.5)
HCT: 38.2 % — ABNORMAL LOW (ref 38.4–49.9)
HEMOGLOBIN: 12.3 g/dL — AB (ref 13.0–17.1)
LYMPH%: 64.9 % — ABNORMAL HIGH (ref 14.0–49.0)
MCH: 27.2 pg (ref 27.2–33.4)
MCHC: 32.2 g/dL (ref 32.0–36.0)
MCV: 84.5 fL (ref 79.3–98.0)
MONO#: 0.3 10*3/uL (ref 0.1–0.9)
MONO%: 13.5 % (ref 0.0–14.0)
NEUT#: 0.4 10*3/uL — CL (ref 1.5–6.5)
NEUT%: 15.5 % — ABNORMAL LOW (ref 39.0–75.0)
PLATELETS: 204 10*3/uL (ref 140–400)
RBC: 4.52 10*6/uL (ref 4.20–5.82)
RDW: 14.3 % (ref 11.0–14.6)
WBC: 2.5 10*3/uL — AB (ref 4.0–10.3)
lymph#: 1.6 10*3/uL (ref 0.9–3.3)

## 2014-10-06 LAB — COMPREHENSIVE METABOLIC PANEL (CC13)
ALK PHOS: 115 U/L (ref 40–150)
ALT: 60 U/L — ABNORMAL HIGH (ref 0–55)
AST: 49 U/L — AB (ref 5–34)
Albumin: 3.6 g/dL (ref 3.5–5.0)
Anion Gap: 5 mEq/L (ref 3–11)
BUN: 12.4 mg/dL (ref 7.0–26.0)
CO2: 29 mEq/L (ref 22–29)
CREATININE: 0.9 mg/dL (ref 0.7–1.3)
Calcium: 8.8 mg/dL (ref 8.4–10.4)
Chloride: 105 mEq/L (ref 98–109)
EGFR: >90 mL/min/{1.73_m2} (ref >90)
Glucose: 94 mg/dl (ref 70–140)
Potassium: 4.2 mEq/L (ref 3.5–5.1)
Sodium: 139 mEq/L (ref 136–145)
Total Bilirubin: 0.67 mg/dL (ref 0.20–1.20)
Total Protein: 6.9 g/dL (ref 6.4–8.3)

## 2014-10-06 MED ORDER — TBO-FILGRASTIM 480 MCG/0.8ML ~~LOC~~ SOSY
480.0000 ug | PREFILLED_SYRINGE | Freq: Once | SUBCUTANEOUS | Status: AC
  Administered 2014-10-06: 480 ug via SUBCUTANEOUS
  Filled 2014-10-06: qty 0.8

## 2014-10-06 MED ORDER — SODIUM CHLORIDE 0.9 % IJ SOLN
10.0000 mL | INTRAMUSCULAR | Status: DC | PRN
  Administered 2014-10-06: 10 mL via INTRAVENOUS
  Filled 2014-10-06: qty 10

## 2014-10-06 NOTE — Patient Instructions (Signed)

## 2014-10-06 NOTE — Telephone Encounter (Signed)
Per staff message and POF I have scheduled appts. Advised scheduler of appts. JMW Per staff message and POF I have scheduled appts. Advised scheduler of appts. JMW  

## 2014-10-06 NOTE — Telephone Encounter (Signed)
Pt confirmed labs/ov per 01/05 POF, gave pt AVS..... KJ, sent msg to add chemo °

## 2014-10-06 NOTE — Progress Notes (Signed)
Granix given by desk nurse.

## 2014-10-07 ENCOUNTER — Encounter: Payer: Self-pay | Admitting: Hematology and Oncology

## 2014-10-07 ENCOUNTER — Ambulatory Visit (HOSPITAL_BASED_OUTPATIENT_CLINIC_OR_DEPARTMENT_OTHER): Payer: 59

## 2014-10-07 DIAGNOSIS — D701 Agranulocytosis secondary to cancer chemotherapy: Secondary | ICD-10-CM | POA: Insufficient documentation

## 2014-10-07 DIAGNOSIS — T451X5A Adverse effect of antineoplastic and immunosuppressive drugs, initial encounter: Principal | ICD-10-CM

## 2014-10-07 DIAGNOSIS — C8190 Hodgkin lymphoma, unspecified, unspecified site: Secondary | ICD-10-CM

## 2014-10-07 DIAGNOSIS — Z5189 Encounter for other specified aftercare: Secondary | ICD-10-CM

## 2014-10-07 DIAGNOSIS — R748 Abnormal levels of other serum enzymes: Secondary | ICD-10-CM | POA: Insufficient documentation

## 2014-10-07 DIAGNOSIS — G620 Drug-induced polyneuropathy: Secondary | ICD-10-CM | POA: Insufficient documentation

## 2014-10-07 MED ORDER — TBO-FILGRASTIM 480 MCG/0.8ML ~~LOC~~ SOSY
480.0000 ug | PREFILLED_SYRINGE | Freq: Once | SUBCUTANEOUS | Status: AC
  Administered 2014-10-07: 480 ug via SUBCUTANEOUS
  Filled 2014-10-07: qty 0.8

## 2014-10-07 NOTE — Assessment & Plan Note (Signed)
This is due to recent chemotherapy. The dosage of chemotherapy was reduced

## 2014-10-07 NOTE — Assessment & Plan Note (Signed)
He tolerated cycle 1 poorly. He has cough significant neutropenia, abnormal liver function test and neuropathy. I plan to hold treatment today and rescheduled to next week.  I will give him G-CSF today and tomorrow due to recent fever.

## 2014-10-07 NOTE — Assessment & Plan Note (Signed)
He has mild peripheral neuropathy after only 1 cycle of treatment. I will reduce the dose of vinblastine.

## 2014-10-07 NOTE — Progress Notes (Signed)
[facility] OFFICE PROGRESS NOTE  Patient Care Team: [name], MD as PCP - General (Family Medicine)  SUMMARY OF ONCOLOGIC HISTORY: Oncology History   Hodgkin lymphoma   Staging form: Lymphoid Neoplasms, AJCC 6th Edition     Clinical stage from 09/13/2014: Stage III - Signed by [name], MD on 09/13/2014       Hodgkin lymphoma   09/04/2014 Pathology Results Accession: [id number] inguinal lymph node biopsy confirmed Hodgkin lymphoma   09/04/2014 Procedure Dr. Dalbert Batman performed excision of left inguinal lymph node biopsy   09/11/2014 Imaging CT scan of the chest, abdomen and pelvis show disease both above and below the diaphragm   09/16/2014 Imaging Echocardiogram is negative   09/18/2014 Bone Marrow Biopsy Bone marrow aspirate and biopsy is negative   09/22/2014 -  Chemotherapy  the patient was started on  ABVD   10/06/2014 Adverse Reaction  treatment was delayed due to severe leukopenia. Future dose modification was made for abnormal liver function tests, leukopenia and neuropathy    INTERVAL HISTORY: Please see below for problem oriented charting. He is seen prior to cycle 2 of treatment. He complained of significant fatigue and reduced appetite. He had fever after chemotherapy, cultures negative. He complained of mild neuropathy.  REVIEW OF SYSTEMS:   Constitutional: Denies fevers, chills or abnormal weight loss Eyes: Denies blurriness of vision Ears, nose, mouth, throat, and face: Denies mucositis or sore throat Respiratory: Denies cough, dyspnea or wheezes Cardiovascular: Denies palpitation, chest discomfort or lower extremity swelling Gastrointestinal:  Denies nausea, heartburn or change in bowel habits Skin: Denies abnormal skin rashes Lymphatics: Denies new lymphadenopathy or easy bruising Behavioral/Psych: Mood is stable, no new changes  All other systems were reviewed with the patient and are negative.  I have reviewed the past medical history,  past surgical history, social history and family history with the patient and they are unchanged from previous note.  ALLERGIES:  has No Known Allergies.  MEDICATIONS:  Current Outpatient Prescriptions  Medication Sig Dispense Refill  . acetaminophen (TYLENOL) 500 MG chewable tablet Chew 500 mg by mouth every 6 (six) hours as needed for pain.    . celecoxib (CELEBREX) 200 MG capsule TAKE 1 CAPSULE (200 MG TOTAL) BY MOUTH DAILY. 30 capsule 5  . dexlansoprazole (DEXILANT) 60 MG capsule Take 1 capsule (60 mg total) by mouth daily. 30 capsule 11  . docusate sodium (COLACE) 100 MG capsule Take 100 mg by mouth 2 (two) times daily as needed for mild constipation.    . lidocaine-prilocaine (EMLA) cream Apply to affected area once 30 g 3  . ondansetron (ZOFRAN) 8 MG tablet Take 1 tablet (8 mg total) by mouth every 8 (eight) hours as needed for nausea or vomiting. 30 tablet 1  . polyethylene glycol (MIRALAX / GLYCOLAX) packet Take 17 g by mouth daily.    . pravastatin (PRAVACHOL) 20 MG tablet TAKE 1 TABLET BY MOUTH AT BEDTIME 30 tablet 5  . Probiotic Product (TRUBIOTICS PO) Take 1 capsule by mouth daily.    . prochlorperazine (COMPAZINE) 10 MG tablet Take 1 tablet (10 mg total) by mouth every 6 (six) hours as needed (Nausea or vomiting). 30 tablet 1   No current facility-administered medications for this visit.    PHYSICAL EXAMINATION: ECOG PERFORMANCE STATUS: 1 - Symptomatic but completely ambulatory  Filed Vitals:   10/06/14 1027  BP: 108/55  Pulse: 56  Temp: 98.3 F (36.8 C)  Resp: 18   Filed Weights   10/06/14 1027  Weight: 186 lb 4.8 oz (84.505 kg)    GENERAL:alert, no distress and comfortable SKIN: skin color, texture, turgor are normal, no rashes or significant lesions EYES: normal, Conjunctiva are pink and non-injected, sclera clear OROPHARYNX:no exudate, no erythema and lips, buccal mucosa, and tongue normal  NECK: supple, thyroid normal size, non-tender, without  nodularity LYMPH:  no palpable lymphadenopathy in the cervical, axillary or inguinal LUNGS: clear to auscultation and percussion with normal breathing effort HEART: regular rate & rhythm and no murmurs and no lower extremity edema ABDOMEN:abdomen soft, non-tender and normal bowel sounds Musculoskeletal:no cyanosis of digits and no clubbing  NEURO: alert & oriented x 3 with fluent speech, no focal motor/sensory deficits  LABORATORY DATA:  I have reviewed the data as listed    Component Value Date/Time   NA 139 10/06/2014 1002   NA 136 09/24/2014 1724   K 4.2 10/06/2014 1002   K 3.7 09/24/2014 1724   CL 102 09/24/2014 1724   CO2 29 10/06/2014 1002   CO2 25 09/24/2014 1724   GLUCOSE 94 10/06/2014 1002   GLUCOSE 112* 09/24/2014 1724   BUN 12.4 10/06/2014 1002   BUN 23 09/24/2014 1724   CREATININE 0.9 10/06/2014 1002   CREATININE 0.89 09/24/2014 1724   CREATININE 1.00 09/11/2014 0808   CALCIUM 8.8 10/06/2014 1002   CALCIUM 8.8 09/24/2014 1724   PROT 6.9 10/06/2014 1002   PROT 7.4 09/24/2014 1724   ALBUMIN 3.6 10/06/2014 1002   ALBUMIN 3.5 09/24/2014 1724   AST 49* 10/06/2014 1002   AST 29 09/24/2014 1724   ALT 60* 10/06/2014 1002   ALT 30 09/24/2014 1724   ALKPHOS 115 10/06/2014 1002   ALKPHOS 147* 09/24/2014 1724   BILITOT 0.67 10/06/2014 1002   BILITOT 0.9 09/24/2014 1724   GFRNONAA >90 09/24/2014 1724   GFRNONAA 89 09/11/2014 0808   GFRAA >90 09/24/2014 1724   GFRAA >89 09/11/2014 0808    No results found for: SPEP, UPEP  Lab Results  Component Value Date   WBC 2.5* 10/06/2014   NEUTROABS 0.4* 10/06/2014   HGB 12.3* 10/06/2014   HCT 38.2* 10/06/2014   MCV 84.5 10/06/2014   PLT 204 10/06/2014      Chemistry      Component Value Date/Time   NA 139 10/06/2014 1002   NA 136 09/24/2014 1724   K 4.2 10/06/2014 1002   K 3.7 09/24/2014 1724   CL 102 09/24/2014 1724   CO2 29 10/06/2014 1002   CO2 25 09/24/2014 1724   BUN 12.4 10/06/2014 1002   BUN 23  09/24/2014 1724   CREATININE 0.9 10/06/2014 1002   CREATININE 0.89 09/24/2014 1724   CREATININE 1.00 09/11/2014 0808      Component Value Date/Time   CALCIUM 8.8 10/06/2014 1002   CALCIUM 8.8 09/24/2014 1724   ALKPHOS 115 10/06/2014 1002   ALKPHOS 147* 09/24/2014 1724   AST 49* 10/06/2014 1002   AST 29 09/24/2014 1724   ALT 60* 10/06/2014 1002   ALT 30 09/24/2014 1724   BILITOT 0.67 10/06/2014 1002   BILITOT 0.9 09/24/2014 1724    ASSESSMENT & PLAN:  Hodgkin lymphoma  He tolerated cycle 1 poorly. He has cough significant neutropenia, abnormal liver function test and neuropathy. I plan to hold treatment today and rescheduled to next week.  I will give him G-CSF today and tomorrow due to recent fever.  Leukopenia due to antineoplastic chemotherapy  Treatment is placed on hold. I will start him on G-CSF due to recent  fever. The patient is placed on neutropenic precaution.  The dosage of multiple chemotherapeutic agent was adjusted.  Elevated liver enzymes  This is due to recent chemotherapy. The dosage of chemotherapy was reduced  Neuropathy due to chemotherapeutic drug  He has mild peripheral neuropathy after only 1 cycle of treatment. I will reduce the dose of vinblastine.   No orders of the defined types were placed in this encounter.   All questions were answered. The patient knows to call the clinic with any problems, questions or concerns. No barriers to learning was detected. I spent 30 minutes counseling the patient face to face. The total time spent in the appointment was 40 minutes and more than 50% was on counseling and review of test results     [facility], [place], MD 10/07/2014 10:55 AM

## 2014-10-07 NOTE — Patient Instructions (Signed)

## 2014-10-07 NOTE — Assessment & Plan Note (Signed)
Treatment is placed on hold. I will start him on G-CSF due to recent fever. The patient is placed on neutropenic precaution.  The dosage of multiple chemotherapeutic agent was adjusted.

## 2014-10-14 ENCOUNTER — Ambulatory Visit (HOSPITAL_BASED_OUTPATIENT_CLINIC_OR_DEPARTMENT_OTHER): Payer: 59

## 2014-10-14 ENCOUNTER — Other Ambulatory Visit (HOSPITAL_BASED_OUTPATIENT_CLINIC_OR_DEPARTMENT_OTHER): Payer: 59

## 2014-10-14 DIAGNOSIS — C8190 Hodgkin lymphoma, unspecified, unspecified site: Secondary | ICD-10-CM

## 2014-10-14 DIAGNOSIS — Z5111 Encounter for antineoplastic chemotherapy: Secondary | ICD-10-CM

## 2014-10-14 LAB — CBC WITH DIFFERENTIAL/PLATELET
BASO%: 1 % (ref 0.0–2.0)
Basophils Absolute: 0.1 10*3/uL (ref 0.0–0.1)
EOS%: 1.7 % (ref 0.0–7.0)
Eosinophils Absolute: 0.2 10*3/uL (ref 0.0–0.5)
HCT: 45 % (ref 38.4–49.9)
HEMOGLOBIN: 14.4 g/dL (ref 13.0–17.1)
LYMPH%: 29.4 % (ref 14.0–49.0)
MCH: 26.9 pg — ABNORMAL LOW (ref 27.2–33.4)
MCHC: 31.9 g/dL — ABNORMAL LOW (ref 32.0–36.0)
MCV: 84.1 fL (ref 79.3–98.0)
MONO#: 0.7 10*3/uL (ref 0.1–0.9)
MONO%: 6.6 % (ref 0.0–14.0)
NEUT%: 61.3 % (ref 39.0–75.0)
NEUTROS ABS: 6.7 10*3/uL — AB (ref 1.5–6.5)
Platelets: 162 10*3/uL (ref 140–400)
RBC: 5.35 10*6/uL (ref 4.20–5.82)
RDW: 15.2 % — ABNORMAL HIGH (ref 11.0–14.6)
WBC: 10.9 10*3/uL — AB (ref 4.0–10.3)
lymph#: 3.2 10*3/uL (ref 0.9–3.3)

## 2014-10-14 LAB — COMPREHENSIVE METABOLIC PANEL (CC13)
ALBUMIN: 3.9 g/dL (ref 3.5–5.0)
ALT: 44 U/L (ref 0–55)
AST: 33 U/L (ref 5–34)
Alkaline Phosphatase: 130 U/L (ref 40–150)
Anion Gap: 8 mEq/L (ref 3–11)
BILIRUBIN TOTAL: 0.58 mg/dL (ref 0.20–1.20)
BUN: 14.8 mg/dL (ref 7.0–26.0)
CO2: 29 mEq/L (ref 22–29)
CREATININE: 1.1 mg/dL (ref 0.7–1.3)
Calcium: 9.3 mg/dL (ref 8.4–10.4)
Chloride: 103 mEq/L (ref 98–109)
EGFR: 79 mL/min/{1.73_m2} — AB (ref >90)
GLUCOSE: 79 mg/dL (ref 70–140)
Potassium: 4.4 mEq/L (ref 3.5–5.1)
Sodium: 140 mEq/L (ref 136–145)
Total Protein: 7.3 g/dL (ref 6.4–8.3)

## 2014-10-14 MED ORDER — SODIUM CHLORIDE 0.9 % IJ SOLN
10.0000 mL | INTRAMUSCULAR | Status: DC | PRN
  Filled 2014-10-14: qty 10

## 2014-10-14 MED ORDER — ONDANSETRON 16 MG/50ML IVPB (CHCC)
16.0000 mg | Freq: Once | INTRAVENOUS | Status: AC
  Administered 2014-10-14: 16 mg via INTRAVENOUS

## 2014-10-14 MED ORDER — SODIUM CHLORIDE 0.9 % IV SOLN
10.0000 [IU]/m2 | Freq: Once | INTRAVENOUS | Status: AC
  Administered 2014-10-14: 21 [IU] via INTRAVENOUS
  Filled 2014-10-14: qty 7

## 2014-10-14 MED ORDER — DEXAMETHASONE SODIUM PHOSPHATE 20 MG/5ML IJ SOLN
20.0000 mg | Freq: Once | INTRAMUSCULAR | Status: AC
Start: 2014-10-14 — End: 2014-10-14
  Administered 2014-10-14: 20 mg via INTRAVENOUS

## 2014-10-14 MED ORDER — VINBLASTINE SULFATE CHEMO INJECTION 1 MG/ML
2.9000 mg/m2 | Freq: Once | INTRAVENOUS | Status: AC
  Administered 2014-10-14: 6 mg via INTRAVENOUS
  Filled 2014-10-14: qty 6

## 2014-10-14 MED ORDER — HEPARIN SOD (PORK) LOCK FLUSH 100 UNIT/ML IV SOLN
500.0000 [IU] | Freq: Once | INTRAVENOUS | Status: DC | PRN
  Filled 2014-10-14: qty 5

## 2014-10-14 MED ORDER — SODIUM CHLORIDE 0.9 % IV SOLN
337.5000 mg/m2 | Freq: Once | INTRAVENOUS | Status: AC
  Administered 2014-10-14: 700 mg via INTRAVENOUS
  Filled 2014-10-14: qty 35

## 2014-10-14 MED ORDER — ONDANSETRON 16 MG/50ML IVPB (CHCC)
INTRAVENOUS | Status: AC
  Filled 2014-10-14: qty 16

## 2014-10-14 MED ORDER — SODIUM CHLORIDE 0.9 % IV SOLN
Freq: Once | INTRAVENOUS | Status: AC
  Administered 2014-10-14: 10:00:00 via INTRAVENOUS

## 2014-10-14 MED ORDER — DEXAMETHASONE SODIUM PHOSPHATE 20 MG/5ML IJ SOLN
INTRAMUSCULAR | Status: AC
Start: 2014-10-14 — End: 2014-10-14
  Filled 2014-10-14: qty 5

## 2014-10-14 MED ORDER — DOXORUBICIN HCL CHEMO IV INJECTION 2 MG/ML
22.5000 mg/m2 | Freq: Once | INTRAVENOUS | Status: AC
  Administered 2014-10-14: 46 mg via INTRAVENOUS
  Filled 2014-10-14: qty 23

## 2014-10-14 NOTE — Patient Instructions (Signed)
[facility] Discharge Instructions for Patients Receiving Chemotherapy  Today you received the following chemotherapy agents Adriamycin, Bleomycin, Velban, DTIC  To help prevent nausea and vomiting after your treatment, we encourage you to take your nausea medication as directed.}   If you develop nausea and vomiting that is not controlled by your nausea medication, call the clinic.   BELOW ARE SYMPTOMS THAT SHOULD BE REPORTED IMMEDIATELY:  *FEVER GREATER THAN 100.5 F  *CHILLS WITH OR WITHOUT FEVER  NAUSEA AND VOMITING THAT IS NOT CONTROLLED WITH YOUR NAUSEA MEDICATION  *UNUSUAL SHORTNESS OF BREATH  *UNUSUAL BRUISING OR BLEEDING  TENDERNESS IN MOUTH AND THROAT WITH OR WITHOUT PRESENCE OF ULCERS  *URINARY PROBLEMS  *BOWEL PROBLEMS  UNUSUAL RASH Items with * indicate a potential emergency and should be followed up as soon as possible.  Feel free to call the clinic you have any questions or concerns. The clinic phone number is [phone].

## 2014-10-19 ENCOUNTER — Other Ambulatory Visit (HOSPITAL_COMMUNITY): Payer: 59

## 2014-10-20 ENCOUNTER — Other Ambulatory Visit: Payer: 59

## 2014-10-28 ENCOUNTER — Other Ambulatory Visit (HOSPITAL_BASED_OUTPATIENT_CLINIC_OR_DEPARTMENT_OTHER): Payer: 59

## 2014-10-28 ENCOUNTER — Encounter: Payer: Self-pay | Admitting: Hematology and Oncology

## 2014-10-28 ENCOUNTER — Ambulatory Visit (HOSPITAL_BASED_OUTPATIENT_CLINIC_OR_DEPARTMENT_OTHER): Payer: 59

## 2014-10-28 ENCOUNTER — Ambulatory Visit (HOSPITAL_BASED_OUTPATIENT_CLINIC_OR_DEPARTMENT_OTHER): Payer: 59 | Admitting: Hematology and Oncology

## 2014-10-28 ENCOUNTER — Telehealth: Payer: Self-pay | Admitting: Hematology and Oncology

## 2014-10-28 VITALS — BP 108/61 | HR 65 | Temp 97.9°F | Resp 18 | Ht 70.0 in | Wt 180.2 lb

## 2014-10-28 DIAGNOSIS — C8190 Hodgkin lymphoma, unspecified, unspecified site: Secondary | ICD-10-CM

## 2014-10-28 DIAGNOSIS — K1231 Oral mucositis (ulcerative) due to antineoplastic therapy: Secondary | ICD-10-CM | POA: Insufficient documentation

## 2014-10-28 DIAGNOSIS — G620 Drug-induced polyneuropathy: Secondary | ICD-10-CM

## 2014-10-28 DIAGNOSIS — Z5111 Encounter for antineoplastic chemotherapy: Secondary | ICD-10-CM

## 2014-10-28 DIAGNOSIS — D701 Agranulocytosis secondary to cancer chemotherapy: Secondary | ICD-10-CM

## 2014-10-28 DIAGNOSIS — T451X5A Adverse effect of antineoplastic and immunosuppressive drugs, initial encounter: Secondary | ICD-10-CM

## 2014-10-28 LAB — CBC WITH DIFFERENTIAL/PLATELET
BASO%: 1.9 % (ref 0.0–2.0)
BASOS ABS: 0.1 10*3/uL (ref 0.0–0.1)
EOS ABS: 0.2 10*3/uL (ref 0.0–0.5)
EOS%: 6.3 % (ref 0.0–7.0)
HEMATOCRIT: 41 % (ref 38.4–49.9)
HGB: 13.5 g/dL (ref 13.0–17.1)
LYMPH%: 40.4 % (ref 14.0–49.0)
MCH: 27.9 pg (ref 27.2–33.4)
MCHC: 32.9 g/dL (ref 32.0–36.0)
MCV: 84.7 fL (ref 79.3–98.0)
MONO#: 0.5 10*3/uL (ref 0.1–0.9)
MONO%: 12.8 % (ref 0.0–14.0)
NEUT%: 38.6 % — AB (ref 39.0–75.0)
NEUTROS ABS: 1.4 10*3/uL — AB (ref 1.5–6.5)
PLATELETS: 158 10*3/uL (ref 140–400)
RBC: 4.84 10*6/uL (ref 4.20–5.82)
RDW: 14.9 % — ABNORMAL HIGH (ref 11.0–14.6)
WBC: 3.7 10*3/uL — ABNORMAL LOW (ref 4.0–10.3)
lymph#: 1.5 10*3/uL (ref 0.9–3.3)

## 2014-10-28 LAB — COMPREHENSIVE METABOLIC PANEL (CC13)
ALK PHOS: 101 U/L (ref 40–150)
ALT: 35 U/L (ref 0–55)
AST: 31 U/L (ref 5–34)
Albumin: 4.1 g/dL (ref 3.5–5.0)
Anion Gap: 11 mEq/L (ref 3–11)
BILIRUBIN TOTAL: 0.92 mg/dL (ref 0.20–1.20)
BUN: 12 mg/dL (ref 7.0–26.0)
CALCIUM: 9.1 mg/dL (ref 8.4–10.4)
CO2: 26 mEq/L (ref 22–29)
Chloride: 104 mEq/L (ref 98–109)
Creatinine: 0.9 mg/dL (ref 0.7–1.3)
EGFR: >90 mL/min/{1.73_m2} (ref >90)
GLUCOSE: 87 mg/dL (ref 70–140)
POTASSIUM: 4.4 meq/L (ref 3.5–5.1)
Sodium: 141 mEq/L (ref 136–145)
Total Protein: 7 g/dL (ref 6.4–8.3)

## 2014-10-28 MED ORDER — ONDANSETRON 16 MG/50ML IVPB (CHCC)
INTRAVENOUS | Status: AC
  Filled 2014-10-28: qty 16

## 2014-10-28 MED ORDER — ONDANSETRON 16 MG/50ML IVPB (CHCC)
16.0000 mg | Freq: Once | INTRAVENOUS | Status: AC
  Administered 2014-10-28: 16 mg via INTRAVENOUS

## 2014-10-28 MED ORDER — SODIUM CHLORIDE 0.9 % IV SOLN
375.0000 mg/m2 | Freq: Once | INTRAVENOUS | Status: AC
  Administered 2014-10-28: 780 mg via INTRAVENOUS
  Filled 2014-10-28: qty 39

## 2014-10-28 MED ORDER — SODIUM CHLORIDE 0.9 % IV SOLN
10.0000 [IU]/m2 | Freq: Once | INTRAVENOUS | Status: AC
  Administered 2014-10-28: 21 [IU] via INTRAVENOUS
  Filled 2014-10-28: qty 7

## 2014-10-28 MED ORDER — DEXAMETHASONE SODIUM PHOSPHATE 20 MG/5ML IJ SOLN
INTRAMUSCULAR | Status: AC
  Filled 2014-10-28: qty 5

## 2014-10-28 MED ORDER — SODIUM CHLORIDE 0.9 % IJ SOLN
10.0000 mL | INTRAMUSCULAR | Status: DC | PRN
  Administered 2014-10-28: 10 mL
  Filled 2014-10-28: qty 10

## 2014-10-28 MED ORDER — SODIUM CHLORIDE 0.9 % IV SOLN
Freq: Once | INTRAVENOUS | Status: AC
  Administered 2014-10-28: 12:00:00 via INTRAVENOUS

## 2014-10-28 MED ORDER — DEXAMETHASONE SODIUM PHOSPHATE 20 MG/5ML IJ SOLN
20.0000 mg | Freq: Once | INTRAMUSCULAR | Status: AC
  Administered 2014-10-28: 20 mg via INTRAVENOUS

## 2014-10-28 MED ORDER — VINBLASTINE SULFATE CHEMO INJECTION 1 MG/ML
5.8000 mg/m2 | Freq: Once | INTRAVENOUS | Status: AC
  Administered 2014-10-28: 12 mg via INTRAVENOUS
  Filled 2014-10-28: qty 12

## 2014-10-28 MED ORDER — DOXORUBICIN HCL CHEMO IV INJECTION 2 MG/ML
25.0000 mg/m2 | Freq: Once | INTRAVENOUS | Status: AC
  Administered 2014-10-28: 52 mg via INTRAVENOUS
  Filled 2014-10-28: qty 26

## 2014-10-28 MED ORDER — HEPARIN SOD (PORK) LOCK FLUSH 100 UNIT/ML IV SOLN
500.0000 [IU] | Freq: Once | INTRAVENOUS | Status: AC | PRN
  Administered 2014-10-28: 500 [IU]
  Filled 2014-10-28: qty 5

## 2014-10-28 NOTE — Progress Notes (Signed)
Faxed path and itemized bills to [employer] for patient's cancer policy

## 2014-10-28 NOTE — Assessment & Plan Note (Signed)
He tolerated treatment well up off from minor side effects. The mucositis and neuropathy improved with recent slight dose adjustment. He went to [facility] recently for second opinion. Per recommendation by his oncologist at [facility], she would like him to get full dose of treatment without growth factor support unless the patient developed neutropenic sepsis. I discussed this treatment recommendation with the patient and he agreed to go back on full dose treatment.

## 2014-10-28 NOTE — Telephone Encounter (Signed)
gv and printed appt sched and avs for pt for Feb...sed added tx.   °

## 2014-10-28 NOTE — Assessment & Plan Note (Signed)
This is related to side effects of treatment. Per recommendation from [facility], we will proceed regardless of [place] and will not cover him with growth factor support.

## 2014-10-28 NOTE — Patient Instructions (Signed)
[facility] Discharge Instructions for Patients Receiving Chemotherapy  Today you received the following chemotherapy agents Adria, Vinblastine, DTIC, Bleomycin  To help prevent nausea and vomiting after your treatment, we encourage you to take your nausea medication as directed/prescribed   If you develop nausea and vomiting that is not controlled by your nausea medication, call the clinic.   BELOW ARE SYMPTOMS THAT SHOULD BE REPORTED IMMEDIATELY:  *FEVER GREATER THAN 100.5 F  *CHILLS WITH OR WITHOUT FEVER  NAUSEA AND VOMITING THAT IS NOT CONTROLLED WITH YOUR NAUSEA MEDICATION  *UNUSUAL SHORTNESS OF BREATH  *UNUSUAL BRUISING OR BLEEDING  TENDERNESS IN MOUTH AND THROAT WITH OR WITHOUT PRESENCE OF ULCERS  *URINARY PROBLEMS  *BOWEL PROBLEMS  UNUSUAL RASH Items with * indicate a potential emergency and should be followed up as soon as possible.  Feel free to call the clinic you have any questions or concerns. The clinic phone number is [phone].

## 2014-10-28 NOTE — Assessment & Plan Note (Signed)
This is related to side effects of treatment. I recommend a trial of baking soda mixture or Magic mouthwash.

## 2014-10-28 NOTE — Assessment & Plan Note (Signed)
His symptoms were slightly better but per recommendation from due, we will increase his chemotherapy back to full dose.

## 2014-10-28 NOTE — Progress Notes (Signed)
[facility] OFFICE PROGRESS NOTE  Patient Care Team: [name], MD as PCP - General (Family Medicine)  SUMMARY OF ONCOLOGIC HISTORY: Oncology History   Hodgkin lymphoma   Staging form: Lymphoid Neoplasms, AJCC 6th Edition     Clinical stage from 09/13/2014: Stage III - Signed by [name], MD on 09/13/2014       Hodgkin lymphoma   09/04/2014 Pathology Results Accession: [id number] inguinal lymph node biopsy confirmed Hodgkin lymphoma   09/04/2014 Procedure Dr. Dalbert Batman performed excision of left inguinal lymph node biopsy   09/11/2014 Imaging CT scan of the chest, abdomen and pelvis show disease both above and below the diaphragm   09/16/2014 Imaging Echocardiogram is negative   09/18/2014 Bone Marrow Biopsy Bone marrow aspirate and biopsy is negative   09/22/2014 -  Chemotherapy  the patient was started on  ABVD   10/06/2014 Adverse Reaction  treatment was delayed due to severe leukopenia. Future dose modification was made for abnormal liver function tests, leukopenia and neuropathy    INTERVAL HISTORY: Please see below for problem oriented charting. He is seen prior to cycle 2 of treatment. He complained of mild mucositis and neuropathy.  REVIEW OF SYSTEMS:   Constitutional: Denies fevers, chills or abnormal weight loss Eyes: Denies blurriness of vision Ears, nose, mouth, throat, and face: Denies mucositis or sore throat Respiratory: Denies cough, dyspnea or wheezes Cardiovascular: Denies palpitation, chest discomfort or lower extremity swelling Gastrointestinal:  Denies nausea, heartburn or change in bowel habits Skin: Denies abnormal skin rashes Lymphatics: Denies new lymphadenopathy or easy bruising Neurological:Denies numbness, tingling or new weaknesses Behavioral/Psych: Mood is stable, no new changes  All other systems were reviewed with the patient and are negative.  I have reviewed the past medical history, past surgical history, social history  and family history with the patient and they are unchanged from previous note.  ALLERGIES:  has No Known Allergies.  MEDICATIONS:  Current Outpatient Prescriptions  Medication Sig Dispense Refill  . celecoxib (CELEBREX) 200 MG capsule TAKE 1 CAPSULE (200 MG TOTAL) BY MOUTH DAILY. 30 capsule 5  . dexlansoprazole (DEXILANT) 60 MG capsule Take 1 capsule (60 mg total) by mouth daily. 30 capsule 11  . lidocaine-prilocaine (EMLA) cream Apply to affected area once 30 g 3  . polyethylene glycol (MIRALAX / GLYCOLAX) packet Take 17 g by mouth daily.    . pravastatin (PRAVACHOL) 20 MG tablet TAKE 1 TABLET BY MOUTH AT BEDTIME 30 tablet 5  . Probiotic Product (TRUBIOTICS PO) Take 1 capsule by mouth daily.    Marland Kitchen acetaminophen (TYLENOL) 500 MG chewable tablet Chew 500 mg by mouth every 6 (six) hours as needed for pain.    Marland Kitchen docusate sodium (COLACE) 100 MG capsule Take 100 mg by mouth 2 (two) times daily as needed for mild constipation.    . ondansetron (ZOFRAN) 8 MG tablet Take 1 tablet (8 mg total) by mouth every 8 (eight) hours as needed for nausea or vomiting. (Patient not taking: Reported on 10/28/2014) 30 tablet 1  . prochlorperazine (COMPAZINE) 10 MG tablet Take 1 tablet (10 mg total) by mouth every 6 (six) hours as needed (Nausea or vomiting). (Patient not taking: Reported on 10/28/2014) 30 tablet 1   No current facility-administered medications for this visit.    PHYSICAL EXAMINATION: ECOG PERFORMANCE STATUS: 0 - Asymptomatic  Filed Vitals:   10/28/14 1042  BP: 108/61  Pulse: 65  Temp: 97.9 F (36.6 C)  Resp: 18   Filed Weights  10/28/14 1042  Weight: 180 lb 3.2 oz (81.738 kg)    GENERAL:alert, no distress and comfortable SKIN: skin color, texture, turgor are normal, no rashes or significant lesions EYES: normal, Conjunctiva are pink and non-injected, sclera clear OROPHARYNX:no exudate, no erythema and lips, buccal mucosa, and tongue normal  NECK: supple, thyroid normal size,  non-tender, without nodularity LYMPH:  no palpable lymphadenopathy in the cervical, axillary or inguinal LUNGS: clear to auscultation and percussion with normal breathing effort HEART: regular rate & rhythm and no murmurs and no lower extremity edema ABDOMEN:abdomen soft, non-tender and normal bowel sounds Musculoskeletal:no cyanosis of digits and no clubbing  NEURO: alert & oriented x 3 with fluent speech, no focal motor/sensory deficits  LABORATORY DATA:  I have reviewed the data as listed    Component Value Date/Time   NA 141 10/28/2014 1012   NA 136 09/24/2014 1724   K 4.4 10/28/2014 1012   K 3.7 09/24/2014 1724   CL 102 09/24/2014 1724   CO2 26 10/28/2014 1012   CO2 25 09/24/2014 1724   GLUCOSE 87 10/28/2014 1012   GLUCOSE 112* 09/24/2014 1724   BUN 12.0 10/28/2014 1012   BUN 23 09/24/2014 1724   CREATININE 0.9 10/28/2014 1012   CREATININE 0.89 09/24/2014 1724   CREATININE 1.00 09/11/2014 0808   CALCIUM 9.1 10/28/2014 1012   CALCIUM 8.8 09/24/2014 1724   PROT 7.0 10/28/2014 1012   PROT 7.4 09/24/2014 1724   ALBUMIN 4.1 10/28/2014 1012   ALBUMIN 3.5 09/24/2014 1724   AST 31 10/28/2014 1012   AST 29 09/24/2014 1724   ALT 35 10/28/2014 1012   ALT 30 09/24/2014 1724   ALKPHOS 101 10/28/2014 1012   ALKPHOS 147* 09/24/2014 1724   BILITOT 0.92 10/28/2014 1012   BILITOT 0.9 09/24/2014 1724   GFRNONAA >90 09/24/2014 1724   GFRNONAA 89 09/11/2014 0808   GFRAA >90 09/24/2014 1724   GFRAA >89 09/11/2014 0808    No results found for: SPEP, UPEP  Lab Results  Component Value Date   WBC 3.7* 10/28/2014   NEUTROABS 1.4* 10/28/2014   HGB 13.5 10/28/2014   HCT 41.0 10/28/2014   MCV 84.7 10/28/2014   PLT 158 10/28/2014      Chemistry      Component Value Date/Time   NA 141 10/28/2014 1012   NA 136 09/24/2014 1724   K 4.4 10/28/2014 1012   K 3.7 09/24/2014 1724   CL 102 09/24/2014 1724   CO2 26 10/28/2014 1012   CO2 25 09/24/2014 1724   BUN 12.0 10/28/2014 1012    BUN 23 09/24/2014 1724   CREATININE 0.9 10/28/2014 1012   CREATININE 0.89 09/24/2014 1724   CREATININE 1.00 09/11/2014 0808      Component Value Date/Time   CALCIUM 9.1 10/28/2014 1012   CALCIUM 8.8 09/24/2014 1724   ALKPHOS 101 10/28/2014 1012   ALKPHOS 147* 09/24/2014 1724   AST 31 10/28/2014 1012   AST 29 09/24/2014 1724   ALT 35 10/28/2014 1012   ALT 30 09/24/2014 1724   BILITOT 0.92 10/28/2014 1012   BILITOT 0.9 09/24/2014 1724       RADIOGRAPHIC STUDIES: I have personally reviewed the radiological images as listed and agreed with the findings in the report. No results found.   ASSESSMENT & PLAN:  Hodgkin lymphoma He tolerated treatment well up off from minor side effects. The mucositis and neuropathy improved with recent slight dose adjustment. He went to [facility] recently for second opinion. Per recommendation by  his oncologist at [facility], she would like him to get full dose of treatment without growth factor support unless the patient developed neutropenic sepsis. I discussed this treatment recommendation with the patient and he agreed to go back on full dose treatment.   Leukopenia due to antineoplastic chemotherapy This is related to side effects of treatment. Per recommendation from [facility], we will proceed regardless of [place] and will not cover him with growth factor support.   Neuropathy due to chemotherapeutic drug His symptoms were slightly better but per recommendation from due, we will increase his chemotherapy back to full dose.   Mucositis due to chemotherapy This is related to side effects of treatment. I recommend a trial of baking soda mixture or Magic mouthwash.   I will restage him prior to cycle 3 of treatment with PET CT scan.  Orders Placed This Encounter  Procedures  . NM PET Image Restag (PS) Skull Base To Thigh    Standing Status: Future     Number of Occurrences:      Standing Expiration Date: 12/28/2015    Order  Specific Question:  Reason for Exam (SYMPTOM  OR DIAGNOSIS REQUIRED)    Answer:  staging lymphoma, assess response to Rx    Order Specific Question:  Preferred imaging location?    Answer:  [facility]   All questions were answered. The patient knows to call the clinic with any problems, questions or concerns. No barriers to learning was detected. I spent 30 minutes counseling the patient face to face. The total time spent in the appointment was 40 minutes and more than 50% was on counseling and review of test results     [facility], Janeen Watson, MD 10/28/2014 11:24 AM

## 2014-11-11 ENCOUNTER — Ambulatory Visit (HOSPITAL_BASED_OUTPATIENT_CLINIC_OR_DEPARTMENT_OTHER): Payer: 59

## 2014-11-11 ENCOUNTER — Other Ambulatory Visit (HOSPITAL_BASED_OUTPATIENT_CLINIC_OR_DEPARTMENT_OTHER): Payer: 59

## 2014-11-11 DIAGNOSIS — Z5111 Encounter for antineoplastic chemotherapy: Secondary | ICD-10-CM

## 2014-11-11 DIAGNOSIS — C8190 Hodgkin lymphoma, unspecified, unspecified site: Secondary | ICD-10-CM

## 2014-11-11 LAB — COMPREHENSIVE METABOLIC PANEL (CC13)
ALT: 39 U/L (ref 0–55)
ANION GAP: 8 meq/L (ref 3–11)
AST: 29 U/L (ref 5–34)
Albumin: 4.1 g/dL (ref 3.5–5.0)
Alkaline Phosphatase: 88 U/L (ref 40–150)
BUN: 13.1 mg/dL (ref 7.0–26.0)
CALCIUM: 9.6 mg/dL (ref 8.4–10.4)
CHLORIDE: 104 meq/L (ref 98–109)
CO2: 30 mEq/L — ABNORMAL HIGH (ref 22–29)
Creatinine: 1 mg/dL (ref 0.7–1.3)
EGFR: >90 mL/min/{1.73_m2} (ref >90)
Glucose: 103 mg/dl (ref 70–140)
Potassium: 4.3 mEq/L (ref 3.5–5.1)
Sodium: 143 mEq/L (ref 136–145)
Total Bilirubin: 0.91 mg/dL (ref 0.20–1.20)
Total Protein: 6.8 g/dL (ref 6.4–8.3)

## 2014-11-11 LAB — CBC WITH DIFFERENTIAL/PLATELET
BASO%: 3 % — ABNORMAL HIGH (ref 0.0–2.0)
Basophils Absolute: 0.1 10*3/uL (ref 0.0–0.1)
EOS%: 5.4 % (ref 0.0–7.0)
Eosinophils Absolute: 0.1 10*3/uL (ref 0.0–0.5)
HEMATOCRIT: 41.2 % (ref 38.4–49.9)
HEMOGLOBIN: 13.8 g/dL (ref 13.0–17.1)
LYMPH%: 59.9 % — ABNORMAL HIGH (ref 14.0–49.0)
MCH: 28.5 pg (ref 27.2–33.4)
MCHC: 33.5 g/dL (ref 32.0–36.0)
MCV: 84.9 fL (ref 79.3–98.0)
MONO#: 0.4 10*3/uL (ref 0.1–0.9)
MONO%: 18.8 % — ABNORMAL HIGH (ref 0.0–14.0)
NEUT%: 12.9 % — ABNORMAL LOW (ref 39.0–75.0)
NEUTROS ABS: 0.3 10*3/uL — AB (ref 1.5–6.5)
Platelets: 158 10*3/uL (ref 140–400)
RBC: 4.85 10*6/uL (ref 4.20–5.82)
RDW: 15.5 % — ABNORMAL HIGH (ref 11.0–14.6)
WBC: 2 10*3/uL — ABNORMAL LOW (ref 4.0–10.3)
lymph#: 1.2 10*3/uL (ref 0.9–3.3)

## 2014-11-11 MED ORDER — DOXORUBICIN HCL CHEMO IV INJECTION 2 MG/ML
25.0000 mg/m2 | Freq: Once | INTRAVENOUS | Status: AC
  Administered 2014-11-11: 52 mg via INTRAVENOUS
  Filled 2014-11-11: qty 26

## 2014-11-11 MED ORDER — ONDANSETRON 16 MG/50ML IVPB (CHCC)
INTRAVENOUS | Status: AC
  Filled 2014-11-11: qty 16

## 2014-11-11 MED ORDER — VINBLASTINE SULFATE CHEMO INJECTION 1 MG/ML
5.8000 mg/m2 | Freq: Once | INTRAVENOUS | Status: AC
  Administered 2014-11-11: 12 mg via INTRAVENOUS
  Filled 2014-11-11: qty 12

## 2014-11-11 MED ORDER — DEXAMETHASONE SODIUM PHOSPHATE 20 MG/5ML IJ SOLN
20.0000 mg | Freq: Once | INTRAMUSCULAR | Status: AC
  Administered 2014-11-11: 20 mg via INTRAVENOUS

## 2014-11-11 MED ORDER — DEXAMETHASONE SODIUM PHOSPHATE 20 MG/5ML IJ SOLN
INTRAMUSCULAR | Status: AC
  Filled 2014-11-11: qty 5

## 2014-11-11 MED ORDER — SODIUM CHLORIDE 0.9 % IV SOLN
Freq: Once | INTRAVENOUS | Status: AC
  Administered 2014-11-11: 09:00:00 via INTRAVENOUS

## 2014-11-11 MED ORDER — ONDANSETRON 16 MG/50ML IVPB (CHCC)
16.0000 mg | Freq: Once | INTRAVENOUS | Status: AC
  Administered 2014-11-11: 16 mg via INTRAVENOUS

## 2014-11-11 MED ORDER — HEPARIN SOD (PORK) LOCK FLUSH 100 UNIT/ML IV SOLN
500.0000 [IU] | Freq: Once | INTRAVENOUS | Status: AC | PRN
  Administered 2014-11-11: 500 [IU]
  Filled 2014-11-11: qty 5

## 2014-11-11 MED ORDER — SODIUM CHLORIDE 0.9 % IV SOLN
375.0000 mg/m2 | Freq: Once | INTRAVENOUS | Status: AC
  Administered 2014-11-11: 780 mg via INTRAVENOUS
  Filled 2014-11-11: qty 39

## 2014-11-11 MED ORDER — SODIUM CHLORIDE 0.9 % IJ SOLN
10.0000 mL | INTRAMUSCULAR | Status: DC | PRN
  Administered 2014-11-11: 10 mL
  Filled 2014-11-11: qty 10

## 2014-11-11 MED ORDER — SODIUM CHLORIDE 0.9 % IV SOLN
10.0000 [IU]/m2 | Freq: Once | INTRAVENOUS | Status: AC
  Administered 2014-11-11: 21 [IU] via INTRAVENOUS
  Filled 2014-11-11: qty 7

## 2014-11-11 NOTE — Patient Instructions (Signed)
Neutropenia Neutropenia is a condition that occurs when the level of a certain type of white blood cell (neutrophil) in your body becomes lower than normal. Neutrophils are made in the bone marrow and fight infections. These cells protect against bacteria and viruses. The fewer neutrophils you have, and the longer your body remains without them, the greater your risk of getting a severe infection becomes. CAUSES  The cause of neutropenia may be hard to determine. However, it is usually due to 3 main problems:   Decreased production of neutrophils. This may be due to:  Certain medicines such as chemotherapy.  Genetic problems.  Cancer.  Radiation treatments.  Vitamin deficiency.  Some pesticides.  Increased destruction of neutrophils. This may be due to:  Overwhelming infections.  Hemolytic anemia. This is when the body destroys its own blood cells.  Chemotherapy.  Neutrophils moving to areas of the body where they cannot fight infections. This may be due to:  Dialysis procedures.  Conditions where the spleen becomes enlarged. Neutrophils are held in the spleen and are not available to the rest of the body.  Overwhelming infections. The neutrophils are held in the area of the infection and are not available to the rest of the body. SYMPTOMS  There are no specific symptoms of neutropenia. The lack of neutrophils can result in an infection, and an infection can cause various problems. DIAGNOSIS  Diagnosis is made by a blood test. A complete blood count is performed. The normal level of neutrophils in human blood differs with age and race. Infants have lower counts than older children and adults. African Americans have lower counts than Caucasians or Asians. The average adult level is 1500 cells/mm3 of blood. Neutrophil counts are interpreted as follows:  Greater than 1000 cells/mm3 gives normal protection against infection.  500 to 1000 cells/mm3 gives an increased risk for  infection.  200 to 500 cells/mm3 is a greater risk for severe infection.  Lower than 200 cells/mm3 is a marked risk of infection. This may require hospitalization and treatment with antibiotic medicines. TREATMENT  Treatment depends on the underlying cause, severity, and presence of infections or symptoms. It also depends on your health. Your caregiver will discuss the treatment plan with you. Mild cases are often easily treated and have a good outcome. Preventative measures may also be started to limit your risk of infections. Treatment can include:  Taking antibiotics.  Stopping medicines that are known to cause neutropenia.  Correcting nutritional deficiencies by eating green vegetables to supply folic acid and taking vitamin B supplements.  Stopping exposure to pesticides if your neutropenia is related to pesticide exposure.  Taking a blood growth factor called sargramostim, pegfilgrastim, or filgrastim if you are undergoing chemotherapy for cancer. This stimulates white blood cell production.  Removal of the spleen if you have Felty's syndrome and have repeated infections. HOME CARE INSTRUCTIONS   Follow your caregiver's instructions about when you need to have blood work done.  Wash your hands often. Make sure others who come in contact with you also wash their hands.  Wash raw fruits and vegetables before eating them. They can carry bacteria and fungi.  Avoid people with colds or spreadable (contagious) diseases (chickenpox, herpes zoster, influenza).  Avoid large crowds.  Avoid construction areas. The dust can release fungus into the air.  Be cautious around children in daycare or school environments.  Take care of your respiratory system by coughing and deep breathing.  Bathe daily.  Protect your skin from cuts and   burns.  Do not work in the garden or with flowers and plants.  Care for the mouth before and after meals by brushing with a soft toothbrush. If you have  mucositis, do not use mouthwash. Mouthwash contains alcohol and can dry out the mouth even more.  Clean the area between the genitals and the anus (perineal area) after urination and bowel movements. Women need to wipe from front to back.  Use a water soluble lubricant during sexual intercourse and practice good hygiene after. Do not have intercourse if you are severely neutropenic. Check with your caregiver for guidelines.  Exercise daily as tolerated.  Avoid people who were vaccinated with a live vaccine in the past 30 days. You should not receive live vaccines (polio, typhoid).  Do not provide direct care for pets. Avoid animal droppings. Do not clean litter boxes and bird cages.  Do not share food utensils.  Do not use tampons, enemas, or rectal suppositories unless directed by your caregiver.  Use an electric razor to remove hair.  Wash your hands after handling magazines, letters, and newspapers. SEEK IMMEDIATE MEDICAL CARE IF:   You have a fever.  You have chills or start to shake.  You feel nauseous or vomit.  You develop mouth sores.  You develop aches and pains.  You have redness and swelling around open wounds.  Your skin is warm to the touch.  You have pus coming from your wounds.  You develop swollen lymph nodes.  You feel weak or fatigued.  You develop red streaks on the skin. MAKE SURE YOU:  Understand these instructions.  Will watch your condition.  Will get help right away if you are not doing well or get worse. Document Released: 03/10/2002 Document Revised: 12/11/2011 Document Reviewed: 04/07/2011 ExitCare Patient Information 2015 [employer]. This information is not intended to replace advice given to you by your health care provider. Make sure you discuss any questions you have with your health care provider.  

## 2014-11-11 NOTE — Progress Notes (Signed)
Ok to treat per Dr Alen Blew with [place] of 0.3.

## 2014-11-23 ENCOUNTER — Ambulatory Visit (HOSPITAL_COMMUNITY)
Admission: RE | Admit: 2014-11-23 | Discharge: 2014-11-23 | Disposition: A | Discharge status: Home / Self Care | Payer: 59 | Source: Ambulatory Visit | Attending: Hematology and Oncology | Admitting: Hematology and Oncology

## 2014-11-23 DIAGNOSIS — C8190 Hodgkin lymphoma, unspecified, unspecified site: Secondary | ICD-10-CM | POA: Diagnosis present

## 2014-11-23 LAB — GLUCOSE, CAPILLARY: Glucose-Capillary: 77 mg/dL (ref 70–99)

## 2014-11-23 MED ORDER — FLUDEOXYGLUCOSE F - 18 (FDG) INJECTION
8.9000 | Freq: Once | INTRAVENOUS | Status: AC | PRN
  Administered 2014-11-23: 8.9 via INTRAVENOUS

## 2014-11-25 ENCOUNTER — Other Ambulatory Visit (HOSPITAL_BASED_OUTPATIENT_CLINIC_OR_DEPARTMENT_OTHER): Payer: 59

## 2014-11-25 ENCOUNTER — Ambulatory Visit (HOSPITAL_BASED_OUTPATIENT_CLINIC_OR_DEPARTMENT_OTHER): Payer: 59

## 2014-11-25 ENCOUNTER — Other Ambulatory Visit: Payer: Self-pay | Admitting: Hematology and Oncology

## 2014-11-25 ENCOUNTER — Ambulatory Visit (HOSPITAL_BASED_OUTPATIENT_CLINIC_OR_DEPARTMENT_OTHER): Payer: 59 | Admitting: Hematology and Oncology

## 2014-11-25 ENCOUNTER — Telehealth: Payer: Self-pay | Admitting: Hematology and Oncology

## 2014-11-25 VITALS — BP 107/56 | HR 67 | Temp 98.2°F | Resp 18 | Ht 70.0 in | Wt 177.7 lb

## 2014-11-25 DIAGNOSIS — C8190 Hodgkin lymphoma, unspecified, unspecified site: Secondary | ICD-10-CM

## 2014-11-25 DIAGNOSIS — IMO0002 Reserved for concepts with insufficient information to code with codable children: Secondary | ICD-10-CM | POA: Insufficient documentation

## 2014-11-25 DIAGNOSIS — R634 Abnormal weight loss: Secondary | ICD-10-CM

## 2014-11-25 DIAGNOSIS — T451X5A Adverse effect of antineoplastic and immunosuppressive drugs, initial encounter: Secondary | ICD-10-CM

## 2014-11-25 DIAGNOSIS — G620 Drug-induced polyneuropathy: Secondary | ICD-10-CM

## 2014-11-25 DIAGNOSIS — D701 Agranulocytosis secondary to cancer chemotherapy: Secondary | ICD-10-CM

## 2014-11-25 DIAGNOSIS — Z5111 Encounter for antineoplastic chemotherapy: Secondary | ICD-10-CM

## 2014-11-25 LAB — COMPREHENSIVE METABOLIC PANEL (CC13)
ALT: 32 U/L (ref 0–55)
ANION GAP: 8 meq/L (ref 3–11)
AST: 28 U/L (ref 5–34)
Albumin: 3.9 g/dL (ref 3.5–5.0)
Alkaline Phosphatase: 81 U/L (ref 40–150)
BILIRUBIN TOTAL: 0.84 mg/dL (ref 0.20–1.20)
BUN: 12.4 mg/dL (ref 7.0–26.0)
CO2: 28 meq/L (ref 22–29)
CREATININE: 1.2 mg/dL (ref 0.7–1.3)
Calcium: 9.4 mg/dL (ref 8.4–10.4)
Chloride: 107 mEq/L (ref 98–109)
EGFR: 75 mL/min/{1.73_m2} — ABNORMAL LOW (ref >90)
GLUCOSE: 78 mg/dL (ref 70–140)
Potassium: 4.3 mEq/L (ref 3.5–5.1)
Sodium: 143 mEq/L (ref 136–145)
TOTAL PROTEIN: 6.5 g/dL (ref 6.4–8.3)

## 2014-11-25 LAB — CBC WITH DIFFERENTIAL/PLATELET
BASO%: 4.6 % — ABNORMAL HIGH (ref 0.0–2.0)
Basophils Absolute: 0.1 10*3/uL (ref 0.0–0.1)
EOS ABS: 0.1 10*3/uL (ref 0.0–0.5)
EOS%: 4.6 % (ref 0.0–7.0)
HCT: 39.4 % (ref 38.4–49.9)
HEMOGLOBIN: 13.3 g/dL (ref 13.0–17.1)
LYMPH#: 1.3 10*3/uL (ref 0.9–3.3)
LYMPH%: 60.7 % — ABNORMAL HIGH (ref 14.0–49.0)
MCH: 28.5 pg (ref 27.2–33.4)
MCHC: 33.8 g/dL (ref 32.0–36.0)
MCV: 84.5 fL (ref 79.3–98.0)
MONO#: 0.5 10*3/uL (ref 0.1–0.9)
MONO%: 24.2 % — ABNORMAL HIGH (ref 0.0–14.0)
NEUT%: 5.9 % — ABNORMAL LOW (ref 39.0–75.0)
NEUTROS ABS: 0.1 10*3/uL — AB (ref 1.5–6.5)
Platelets: 179 10*3/uL (ref 140–400)
RBC: 4.66 10*6/uL (ref 4.20–5.82)
RDW: 15.8 % — ABNORMAL HIGH (ref 11.0–14.6)
WBC: 2.2 10*3/uL — ABNORMAL LOW (ref 4.0–10.3)

## 2014-11-25 MED ORDER — SODIUM CHLORIDE 0.9 % IV SOLN
337.5000 mg/m2 | Freq: Once | INTRAVENOUS | Status: AC
  Administered 2014-11-25: 700 mg via INTRAVENOUS
  Filled 2014-11-25: qty 35

## 2014-11-25 MED ORDER — SODIUM CHLORIDE 0.9 % IJ SOLN
10.0000 mL | INTRAMUSCULAR | Status: DC | PRN
  Administered 2014-11-25: 10 mL
  Filled 2014-11-25: qty 10

## 2014-11-25 MED ORDER — ONDANSETRON 16 MG/50ML IVPB (CHCC)
16.0000 mg | Freq: Once | INTRAVENOUS | Status: AC
  Administered 2014-11-25: 16 mg via INTRAVENOUS

## 2014-11-25 MED ORDER — DOXORUBICIN HCL CHEMO IV INJECTION 2 MG/ML
25.0000 mg/m2 | Freq: Once | INTRAVENOUS | Status: AC
  Administered 2014-11-25: 52 mg via INTRAVENOUS
  Filled 2014-11-25: qty 26

## 2014-11-25 MED ORDER — SODIUM CHLORIDE 0.9 % IV SOLN
10.0000 [IU]/m2 | Freq: Once | INTRAVENOUS | Status: AC
  Administered 2014-11-25: 21 [IU] via INTRAVENOUS
  Filled 2014-11-25: qty 7

## 2014-11-25 MED ORDER — ONDANSETRON 16 MG/50ML IVPB (CHCC)
INTRAVENOUS | Status: AC
  Filled 2014-11-25: qty 16

## 2014-11-25 MED ORDER — DEXAMETHASONE SODIUM PHOSPHATE 20 MG/5ML IJ SOLN
INTRAMUSCULAR | Status: AC
  Filled 2014-11-25: qty 5

## 2014-11-25 MED ORDER — DEXAMETHASONE SODIUM PHOSPHATE 20 MG/5ML IJ SOLN
20.0000 mg | Freq: Once | INTRAMUSCULAR | Status: AC
  Administered 2014-11-25: 20 mg via INTRAVENOUS

## 2014-11-25 MED ORDER — SODIUM CHLORIDE 0.9 % IV SOLN
Freq: Once | INTRAVENOUS | Status: AC
  Administered 2014-11-25: 11:00:00 via INTRAVENOUS

## 2014-11-25 MED ORDER — HEPARIN SOD (PORK) LOCK FLUSH 100 UNIT/ML IV SOLN
500.0000 [IU] | Freq: Once | INTRAVENOUS | Status: AC | PRN
  Administered 2014-11-25: 500 [IU]
  Filled 2014-11-25: qty 5

## 2014-11-25 MED ORDER — SODIUM CHLORIDE 0.9 % IV SOLN: 6.0000 mg/m2 | Freq: Once | INTRAVENOUS | Status: DC

## 2014-11-25 MED ORDER — VINBLASTINE SULFATE CHEMO INJECTION 1 MG/ML
12.0000 mg | Freq: Once | INTRAVENOUS | Status: AC
  Administered 2014-11-25: 12 mg via INTRAVENOUS
  Filled 2014-11-25: qty 12

## 2014-11-25 NOTE — Patient Instructions (Addendum)
[facility] Discharge Instructions for Patients Receiving Chemotherapy  Today you received the following chemotherapy agents Adrimaycin, Bleomycin, Dacarbazine and Vinblastine.  To help prevent nausea and vomiting after your treatment, we encourage you to take your nausea medication as directed.    If you develop nausea and vomiting that is not controlled by your nausea medication, call the clinic.   BELOW ARE SYMPTOMS THAT SHOULD BE REPORTED IMMEDIATELY:  *FEVER GREATER THAN 100.5 F  *CHILLS WITH OR WITHOUT FEVER  NAUSEA AND VOMITING THAT IS NOT CONTROLLED WITH YOUR NAUSEA MEDICATION  *UNUSUAL SHORTNESS OF BREATH  *UNUSUAL BRUISING OR BLEEDING  TENDERNESS IN MOUTH AND THROAT WITH OR WITHOUT PRESENCE OF ULCERS  *URINARY PROBLEMS  *BOWEL PROBLEMS  UNUSUAL RASH Items with * indicate a potential emergency and should be followed up as soon as possible.  Feel free to call the clinic you have any questions or concerns. The clinic phone number is [phone].

## 2014-11-25 NOTE — Assessment & Plan Note (Signed)
He had skin blister which I think is related to friction. I reassured him that he does not need any further treatment.

## 2014-11-25 NOTE — Progress Notes (Signed)
OK to treat with ANC-0.1 per Dr. Alvy Bimler

## 2014-11-25 NOTE — Telephone Encounter (Signed)
Pt confirmed labs/ov per 02/24 POF, gave pt AVS.... KJ  °

## 2014-11-25 NOTE — Assessment & Plan Note (Signed)
This is related to side effects of treatment. Per recommendation from [facility], we will proceed regardless of [place] and will not cover him with growth factor support.

## 2014-11-25 NOTE — Assessment & Plan Note (Signed)
His symptoms were slightly better but per recommendation from due, we will increase his chemotherapy back to full dose.

## 2014-11-25 NOTE — Assessment & Plan Note (Signed)
The patient had weight loss due to dietary modification. I recommend increase oral intake and not loose more weight or be under 175 pounds.

## 2014-11-25 NOTE — Progress Notes (Signed)
Ok to treat per MD Alvy Bimler

## 2014-11-25 NOTE — Telephone Encounter (Signed)
lvm for pt regarding to March and April appt....pt in tx will get sched

## 2014-11-25 NOTE — Assessment & Plan Note (Signed)
He tolerated treatment well up off from minor side effects. The mucositis and neuropathy improved with recent slight dose adjustment. He went to [facility] recently for second opinion. Per recommendation by his oncologist at [facility], she would like him to get full dose of treatment without growth factor support unless the patient developed neutropenic sepsis. I discussed this treatment recommendation with the patient and he agreed to go back on full dose treatment. PET CT scan showed a complete response to treatment. We will continue on same dose of treatment.

## 2014-11-25 NOTE — Progress Notes (Signed)
[facility] [place] OFFICE PROGRESS NOTE  Patient Care Team: [name], MD as PCP - General (Family Medicine)  SUMMARY OF ONCOLOGIC HISTORY: Oncology History   Hodgkin lymphoma   Staging form: Lymphoid Neoplasms, AJCC 6th Edition     Clinical stage from 09/13/2014: Stage III - Signed by [name], MD on 09/13/2014       Hodgkin lymphoma   09/04/2014 Pathology Results Accession: [id number] inguinal lymph node biopsy confirmed Hodgkin lymphoma   09/04/2014 Procedure Dr. Dalbert Batman performed excision of left inguinal lymph node biopsy   09/11/2014 Imaging CT scan of the chest, abdomen and pelvis show disease both above and below the diaphragm   09/16/2014 Imaging Echocardiogram is negative   09/18/2014 Bone Marrow Biopsy Bone marrow aspirate and biopsy is negative   09/22/2014 -  Chemotherapy  the patient was started on  ABVD   10/06/2014 Adverse Reaction  treatment was delayed due to severe leukopenia. Future dose modification was made for abnormal liver function tests, leukopenia and neuropathy   11/23/2014 Imaging PET CT scan showed near complete response to Rx    INTERVAL HISTORY: Please see below for problem oriented charting. He is seen prior to cycle 3 of treatment. He had very mild neuropathy. Denies severe mucositis. No recent fevers or chills. He developed a small blister on the lateral aspect of the first toe. He had lost some weight due to dietary modification.  REVIEW OF SYSTEMS:   Constitutional: Denies fevers, chills  Eyes: Denies blurriness of vision Ears, nose, mouth, throat, and face: Denies mucositis or sore throat Respiratory: Denies cough, dyspnea or wheezes Cardiovascular: Denies palpitation, chest discomfort or lower extremity swelling Gastrointestinal:  Denies nausea, heartburn or change in bowel habits Lymphatics: Denies new lymphadenopathy or easy bruising Neurological:Denies numbness, tingling or new weaknesses Behavioral/Psych: Mood is stable,  no new changes  All other systems were reviewed with the patient and are negative.  I have reviewed the past medical history, past surgical history, social history and family history with the patient and they are unchanged from previous note.  ALLERGIES:  has No Known Allergies.  MEDICATIONS:  Current Outpatient Prescriptions  Medication Sig Dispense Refill  . acetaminophen (TYLENOL) 500 MG chewable tablet Chew 500 mg by mouth every 6 (six) hours as needed for pain.    . celecoxib (CELEBREX) 200 MG capsule TAKE 1 CAPSULE (200 MG TOTAL) BY MOUTH DAILY. 30 capsule 5  . dexlansoprazole (DEXILANT) 60 MG capsule Take 1 capsule (60 mg total) by mouth daily. 30 capsule 11  . docusate sodium (COLACE) 100 MG capsule Take 100 mg by mouth 2 (two) times daily as needed for mild constipation.    . lidocaine-prilocaine (EMLA) cream Apply to affected area once 30 g 3  . ondansetron (ZOFRAN) 8 MG tablet Take 1 tablet (8 mg total) by mouth every 8 (eight) hours as needed for nausea or vomiting. (Patient not taking: Reported on 10/28/2014) 30 tablet 1  . polyethylene glycol (MIRALAX / GLYCOLAX) packet Take 17 g by mouth daily.    . pravastatin (PRAVACHOL) 20 MG tablet TAKE 1 TABLET BY MOUTH AT BEDTIME 30 tablet 5  . Probiotic Product (TRUBIOTICS PO) Take 1 capsule by mouth daily.    . prochlorperazine (COMPAZINE) 10 MG tablet Take 1 tablet (10 mg total) by mouth every 6 (six) hours as needed (Nausea or vomiting). (Patient not taking: Reported on 10/28/2014) 30 tablet 1   No current facility-administered medications for this visit.   Facility-Administered Medications Ordered in  Other Visits  Medication Dose Route Frequency Provider Last Rate Last Dose  . sodium chloride 0.9 % injection 10 mL  10 mL Intracatheter PRN [name], MD   10 mL at 11/25/14 1331    PHYSICAL EXAMINATION: ECOG PERFORMANCE STATUS: 1 - Symptomatic but completely ambulatory  Filed Vitals:   11/25/14 1024  BP: 107/56  Pulse: 67   Temp: 98.2 F (36.8 C)  Resp: 18   Filed Weights   11/25/14 1024  Weight: 177 lb 11.2 oz (80.604 kg)    GENERAL:alert, no distress and comfortable SKIN: skin color, texture, turgor are normal, no rashes or significant lesions. Noted fictional blister near the left toe. EYES: normal, Conjunctiva are pink and non-injected, sclera clear OROPHARYNX:no exudate, no erythema and lips, buccal mucosa, and tongue normal  NECK: supple, thyroid normal size, non-tender, without nodularity LYMPH:  no palpable lymphadenopathy in the cervical, axillary or inguinal LUNGS: clear to auscultation and percussion with normal breathing effort HEART: regular rate & rhythm and no murmurs and no lower extremity edema ABDOMEN:abdomen soft, non-tender and normal bowel sounds Musculoskeletal:no cyanosis of digits and no clubbing  NEURO: alert & oriented x 3 with fluent speech, no focal motor/sensory deficits  LABORATORY DATA:  I have reviewed the data as listed    Component Value Date/Time   NA 143 11/25/2014 1000   NA 136 09/24/2014 1724   K 4.3 11/25/2014 1000   K 3.7 09/24/2014 1724   CL 102 09/24/2014 1724   CO2 28 11/25/2014 1000   CO2 25 09/24/2014 1724   GLUCOSE 78 11/25/2014 1000   GLUCOSE 112* 09/24/2014 1724   BUN 12.4 11/25/2014 1000   BUN 23 09/24/2014 1724   CREATININE 1.2 11/25/2014 1000   CREATININE 0.89 09/24/2014 1724   CREATININE 1.00 09/11/2014 0808   CALCIUM 9.4 11/25/2014 1000   CALCIUM 8.8 09/24/2014 1724   PROT 6.5 11/25/2014 1000   PROT 7.4 09/24/2014 1724   ALBUMIN 3.9 11/25/2014 1000   ALBUMIN 3.5 09/24/2014 1724   AST 28 11/25/2014 1000   AST 29 09/24/2014 1724   ALT 32 11/25/2014 1000   ALT 30 09/24/2014 1724   ALKPHOS 81 11/25/2014 1000   ALKPHOS 147* 09/24/2014 1724   BILITOT 0.84 11/25/2014 1000   BILITOT 0.9 09/24/2014 1724   GFRNONAA >90 09/24/2014 1724   GFRNONAA 89 09/11/2014 0808   GFRAA >90 09/24/2014 1724   GFRAA >89 09/11/2014 0808    No results  found for: SPEP, UPEP  Lab Results  Component Value Date   WBC 2.2* 11/25/2014   NEUTROABS 0.1* 11/25/2014   HGB 13.3 11/25/2014   HCT 39.4 11/25/2014   MCV 84.5 11/25/2014   PLT 179 11/25/2014      Chemistry      Component Value Date/Time   NA 143 11/25/2014 1000   NA 136 09/24/2014 1724   K 4.3 11/25/2014 1000   K 3.7 09/24/2014 1724   CL 102 09/24/2014 1724   CO2 28 11/25/2014 1000   CO2 25 09/24/2014 1724   BUN 12.4 11/25/2014 1000   BUN 23 09/24/2014 1724   CREATININE 1.2 11/25/2014 1000   CREATININE 0.89 09/24/2014 1724   CREATININE 1.00 09/11/2014 0808      Component Value Date/Time   CALCIUM 9.4 11/25/2014 1000   CALCIUM 8.8 09/24/2014 1724   ALKPHOS 81 11/25/2014 1000   ALKPHOS 147* 09/24/2014 1724   AST 28 11/25/2014 1000   AST 29 09/24/2014 1724   ALT 32 11/25/2014 1000  ALT 30 09/24/2014 1724   BILITOT 0.84 11/25/2014 1000   BILITOT 0.9 09/24/2014 1724       RADIOGRAPHIC STUDIES: I reviewed the PET scan with him and his wife. I have personally reviewed the radiological images as listed and agreed with the findings in the report.  ASSESSMENT & PLAN:  Hodgkin lymphoma He tolerated treatment well up off from minor side effects. The mucositis and neuropathy improved with recent slight dose adjustment. He went to [facility] recently for second opinion. Per recommendation by his oncologist at [facility], she would like him to get full dose of treatment without growth factor support unless the patient developed neutropenic sepsis. I discussed this treatment recommendation with the patient and he agreed to go back on full dose treatment. PET CT scan showed a complete response to treatment. We will continue on same dose of treatment.     Leukopenia due to antineoplastic chemotherapy This is related to side effects of treatment. Per recommendation from [facility], we will proceed regardless of [place] and will not cover him with growth factor  support.   Neuropathy due to chemotherapeutic drug His symptoms were slightly better but per recommendation from due, we will increase his chemotherapy back to full dose.   Blister of skin without infection He had skin blister which I think is related to friction. I reassured him that he does not need any further treatment.   Weight loss The patient had weight loss due to dietary modification. I recommend increase oral intake and not loose more weight or be under 175 pounds.    No orders of the defined types were placed in this encounter.   All questions were answered. The patient knows to call the clinic with any problems, questions or concerns. No barriers to learning was detected. I spent 30 minutes counseling the patient face to face. The total time spent in the appointment was 40 minutes and more than 50% was on counseling and review of test results     [facility], [place], MD 11/25/2014 4:41 PM

## 2014-11-30 ENCOUNTER — Telehealth: Payer: Self-pay | Admitting: *Deleted

## 2014-11-30 NOTE — Telephone Encounter (Signed)
Tammi, pls call the patient back. [facility] did not recommend it. The patient & husband need to call [facility] first because I was explicitly told by the [place] [facility] physician NOT to give it

## 2014-11-30 NOTE — Telephone Encounter (Signed)
PT.'S WIFE STATES HER HUSBAND WOULD LIKE TO TRY THE NEULASTA INJECTION THIS WEEK BEFORE HIS CHEMOTHERAPY TREATMENT NEXT WEEK. PT. IS AT HOME [phone].

## 2014-11-30 NOTE — Telephone Encounter (Signed)
Pt states he only went to [facility] for second opinion, would like to take neulasta. RN to discuss with Dr Alvy Bimler.

## 2014-12-02 ENCOUNTER — Telehealth: Payer: Self-pay | Admitting: Hematology and Oncology

## 2014-12-02 NOTE — Telephone Encounter (Signed)
I have a long discussion with the patient over the telephone on 11/30/2014. The patient is concerned about the leukopenia and wants to try Neulasta injection. I reviewed with the patient recommendation from [place] regarding the concern for increased risks of pulmonary toxicity in association with Bleomycin and Neulasta injection. We also discussed about possibility of reducing the dose of chemotherapy or delaying treatment. Ultimately, I told the patient to discuss with his wife further and we can review it again next week when he returns for treatment.

## 2014-12-02 NOTE — Telephone Encounter (Signed)
No additional note

## 2014-12-09 ENCOUNTER — Ambulatory Visit: Payer: 59

## 2014-12-09 ENCOUNTER — Other Ambulatory Visit (HOSPITAL_BASED_OUTPATIENT_CLINIC_OR_DEPARTMENT_OTHER): Payer: 59

## 2014-12-09 ENCOUNTER — Ambulatory Visit (HOSPITAL_BASED_OUTPATIENT_CLINIC_OR_DEPARTMENT_OTHER): Payer: 59

## 2014-12-09 DIAGNOSIS — Z5111 Encounter for antineoplastic chemotherapy: Secondary | ICD-10-CM

## 2014-12-09 DIAGNOSIS — Z95828 Presence of other vascular implants and grafts: Secondary | ICD-10-CM

## 2014-12-09 DIAGNOSIS — C8190 Hodgkin lymphoma, unspecified, unspecified site: Secondary | ICD-10-CM

## 2014-12-09 LAB — COMPREHENSIVE METABOLIC PANEL (CC13)
ALBUMIN: 3.7 g/dL (ref 3.5–5.0)
ALT: 28 U/L (ref 0–55)
AST: 26 U/L (ref 5–34)
Alkaline Phosphatase: 86 U/L (ref 40–150)
Anion Gap: 8 mEq/L (ref 3–11)
BILIRUBIN TOTAL: 0.9 mg/dL (ref 0.20–1.20)
BUN: 12.9 mg/dL (ref 7.0–26.0)
CALCIUM: 9 mg/dL (ref 8.4–10.4)
CO2: 26 meq/L (ref 22–29)
Chloride: 105 mEq/L (ref 98–109)
Creatinine: 0.8 mg/dL (ref 0.7–1.3)
Glucose: 96 mg/dl (ref 70–140)
Potassium: 4.2 mEq/L (ref 3.5–5.1)
Sodium: 140 mEq/L (ref 136–145)
TOTAL PROTEIN: 6.4 g/dL (ref 6.4–8.3)

## 2014-12-09 LAB — CBC WITH DIFFERENTIAL/PLATELET
BASO%: 4.6 % — ABNORMAL HIGH (ref 0.0–2.0)
Basophils Absolute: 0.1 10*3/uL (ref 0.0–0.1)
EOS ABS: 0.1 10*3/uL (ref 0.0–0.5)
EOS%: 6.6 % (ref 0.0–7.0)
HCT: 36.5 % — ABNORMAL LOW (ref 38.4–49.9)
HGB: 12.4 g/dL — ABNORMAL LOW (ref 13.0–17.1)
LYMPH%: 52 % — ABNORMAL HIGH (ref 14.0–49.0)
MCH: 28.9 pg (ref 27.2–33.4)
MCHC: 34 g/dL (ref 32.0–36.0)
MCV: 85.1 fL (ref 79.3–98.0)
MONO#: 0.6 10*3/uL (ref 0.1–0.9)
MONO%: 28.1 % — AB (ref 0.0–14.0)
NEUT%: 8.7 % — AB (ref 39.0–75.0)
NEUTROS ABS: 0.2 10*3/uL — AB (ref 1.5–6.5)
PLATELETS: 167 10*3/uL (ref 140–400)
RBC: 4.29 10*6/uL (ref 4.20–5.82)
RDW: 16.3 % — AB (ref 11.0–14.6)
WBC: 2 10*3/uL — ABNORMAL LOW (ref 4.0–10.3)
lymph#: 1 10*3/uL (ref 0.9–3.3)

## 2014-12-09 MED ORDER — SODIUM CHLORIDE 0.9 % IV SOLN
Freq: Once | INTRAVENOUS | Status: AC
  Administered 2014-12-09: 12:00:00 via INTRAVENOUS
  Filled 2014-12-09: qty 8

## 2014-12-09 MED ORDER — SODIUM CHLORIDE 0.9 % IV SOLN
10.0000 [IU]/m2 | Freq: Once | INTRAVENOUS | Status: AC
  Administered 2014-12-09: 21 [IU] via INTRAVENOUS
  Filled 2014-12-09: qty 7

## 2014-12-09 MED ORDER — HEPARIN SOD (PORK) LOCK FLUSH 100 UNIT/ML IV SOLN
500.0000 [IU] | Freq: Once | INTRAVENOUS | Status: AC | PRN
  Administered 2014-12-09: 500 [IU]
  Filled 2014-12-09: qty 5

## 2014-12-09 MED ORDER — SODIUM CHLORIDE 0.9 % IJ SOLN
10.0000 mL | INTRAMUSCULAR | Status: DC | PRN
  Administered 2014-12-09: 10 mL
  Filled 2014-12-09: qty 10

## 2014-12-09 MED ORDER — DOXORUBICIN HCL CHEMO IV INJECTION 2 MG/ML
25.0000 mg/m2 | Freq: Once | INTRAVENOUS | Status: AC
  Administered 2014-12-09: 52 mg via INTRAVENOUS
  Filled 2014-12-09: qty 26

## 2014-12-09 MED ORDER — SODIUM CHLORIDE 0.9 % IV SOLN
337.5000 mg/m2 | Freq: Once | INTRAVENOUS | Status: AC
  Administered 2014-12-09: 700 mg via INTRAVENOUS
  Filled 2014-12-09: qty 35

## 2014-12-09 MED ORDER — SODIUM CHLORIDE 0.9 % IV SOLN: Freq: Once | INTRAVENOUS | Status: DC

## 2014-12-09 MED ORDER — VINBLASTINE SULFATE CHEMO INJECTION 1 MG/ML
5.8000 mg/m2 | Freq: Once | INTRAVENOUS | Status: AC
  Administered 2014-12-09: 12 mg via INTRAVENOUS
  Filled 2014-12-09: qty 12

## 2014-12-09 MED ORDER — SODIUM CHLORIDE 0.9 % IJ SOLN
10.0000 mL | INTRAMUSCULAR | Status: DC | PRN
  Administered 2014-12-09: 10 mL via INTRAVENOUS
  Filled 2014-12-09: qty 10

## 2014-12-09 NOTE — Patient Instructions (Signed)
[facility] Discharge Instructions for Patients Receiving Chemotherapy  Today you received the following chemotherapy agents Adria,Bleo,Vincristine,DTIC  To help prevent nausea and vomiting after your treatment, we encourage you to take your nausea medication as directed/prescribed   If you develop nausea and vomiting that is not controlled by your nausea medication, call the clinic.   BELOW ARE SYMPTOMS THAT SHOULD BE REPORTED IMMEDIATELY:  *FEVER GREATER THAN 100.5 F  *CHILLS WITH OR WITHOUT FEVER  NAUSEA AND VOMITING THAT IS NOT CONTROLLED WITH YOUR NAUSEA MEDICATION  *UNUSUAL SHORTNESS OF BREATH  *UNUSUAL BRUISING OR BLEEDING  TENDERNESS IN MOUTH AND THROAT WITH OR WITHOUT PRESENCE OF ULCERS  *URINARY PROBLEMS  *BOWEL PROBLEMS  UNUSUAL RASH Items with * indicate a potential emergency and should be followed up as soon as possible.  Feel free to call the clinic you have any questions or concerns. The clinic phone number is [phone].

## 2014-12-09 NOTE — Patient Instructions (Signed)

## 2014-12-09 NOTE — Progress Notes (Signed)
Dr. Alvy Bimler aware of pt's labs.  Per MD. Madaline Brilliant to treat despite labs.

## 2014-12-15 ENCOUNTER — Telehealth: Payer: Self-pay | Admitting: *Deleted

## 2014-12-15 NOTE — Telephone Encounter (Signed)
Wife called to say that Mr. Banton has had a headache in the morning of the last two days.  He has taken one dose of Tylenol each day with complete relief.  She said she thinks he is not taking in enough fluids.  He spent some time out side on Sunday and got a sunburn on his head.  Encouraged her to have him increase his water significantly.  He is concerned about taking one dose of Tylenol each day.  Reassured wife that one dose of Tylenol a few days in a row is not concerning.  He is had an increase in stomach acid and has not been taking his PPI.  Encouraged her to have him take the [place] on a daily basis while receiving treatment due to increase stomach acid secondary to dexamethasone as a premedication for chemo.  She agreed to have him take those measures and to call back with worsening symptoms or concerns.

## 2014-12-21 ENCOUNTER — Ambulatory Visit (HOSPITAL_BASED_OUTPATIENT_CLINIC_OR_DEPARTMENT_OTHER): Payer: 59 | Admitting: Nurse Practitioner

## 2014-12-21 ENCOUNTER — Telehealth: Payer: Self-pay | Admitting: *Deleted

## 2014-12-21 ENCOUNTER — Ambulatory Visit (HOSPITAL_COMMUNITY)
Admission: RE | Admit: 2014-12-21 | Discharge: 2014-12-21 | Disposition: A | Discharge status: Home / Self Care | Payer: 59 | Source: Ambulatory Visit | Attending: Nurse Practitioner | Admitting: Nurse Practitioner

## 2014-12-21 ENCOUNTER — Encounter (HOSPITAL_COMMUNITY): Payer: Self-pay

## 2014-12-21 ENCOUNTER — Ambulatory Visit (HOSPITAL_BASED_OUTPATIENT_CLINIC_OR_DEPARTMENT_OTHER): Payer: 59

## 2014-12-21 VITALS — BP 117/62 | HR 72 | Temp 98.2°F | Resp 20 | Wt 180.3 lb

## 2014-12-21 DIAGNOSIS — R0789 Other chest pain: Secondary | ICD-10-CM | POA: Insufficient documentation

## 2014-12-21 DIAGNOSIS — D709 Neutropenia, unspecified: Secondary | ICD-10-CM

## 2014-12-21 DIAGNOSIS — J40 Bronchitis, not specified as acute or chronic: Secondary | ICD-10-CM

## 2014-12-21 DIAGNOSIS — C8190 Hodgkin lymphoma, unspecified, unspecified site: Secondary | ICD-10-CM | POA: Insufficient documentation

## 2014-12-21 LAB — CBC WITH DIFFERENTIAL/PLATELET
BASO%: 4.3 % — ABNORMAL HIGH (ref 0.0–2.0)
BASOS ABS: 0.1 10*3/uL (ref 0.0–0.1)
EOS ABS: 0.2 10*3/uL (ref 0.0–0.5)
EOS%: 9.3 % — ABNORMAL HIGH (ref 0.0–7.0)
HCT: 36.5 % — ABNORMAL LOW (ref 38.4–49.9)
HGB: 11.9 g/dL — ABNORMAL LOW (ref 13.0–17.1)
LYMPH%: 41 % (ref 14.0–49.0)
MCH: 28.2 pg (ref 27.2–33.4)
MCHC: 32.7 g/dL (ref 32.0–36.0)
MCV: 86.2 fL (ref 79.3–98.0)
MONO#: 0.3 10*3/uL (ref 0.1–0.9)
MONO%: 19.6 % — AB (ref 0.0–14.0)
NEUT#: 0.4 10*3/uL — CL (ref 1.5–6.5)
NEUT%: 25.8 % — AB (ref 39.0–75.0)
PLATELETS: 191 10*3/uL (ref 140–400)
RBC: 4.23 10*6/uL (ref 4.20–5.82)
RDW: 17.8 % — AB (ref 11.0–14.6)
WBC: 1.7 10*3/uL — ABNORMAL LOW (ref 4.0–10.3)
lymph#: 0.7 10*3/uL — ABNORMAL LOW (ref 0.9–3.3)

## 2014-12-21 LAB — COMPREHENSIVE METABOLIC PANEL (CC13)
ALT: 32 U/L (ref 0–55)
AST: 27 U/L (ref 5–34)
Albumin: 4 g/dL (ref 3.5–5.0)
Alkaline Phosphatase: 97 U/L (ref 40–150)
Anion Gap: 10 mEq/L (ref 3–11)
BUN: 12.8 mg/dL (ref 7.0–26.0)
CO2: 26 mEq/L (ref 22–29)
Calcium: 9.4 mg/dL (ref 8.4–10.4)
Chloride: 104 mEq/L (ref 98–109)
Creatinine: 0.9 mg/dL (ref 0.7–1.3)
EGFR: >90 mL/min/{1.73_m2} (ref >90)
Glucose: 81 mg/dl (ref 70–140)
Potassium: 4.3 mEq/L (ref 3.5–5.1)
SODIUM: 140 meq/L (ref 136–145)
TOTAL PROTEIN: 6.9 g/dL (ref 6.4–8.3)
Total Bilirubin: 1.11 mg/dL (ref 0.20–1.20)

## 2014-12-21 MED ORDER — AZITHROMYCIN 250 MG PO TABS: ORAL_TABLET | ORAL | Status: DC

## 2014-12-21 MED ORDER — IOHEXOL 350 MG/ML SOLN
100.0000 mL | Freq: Once | INTRAVENOUS | Status: AC | PRN
  Administered 2014-12-21: 100 mL via INTRAVENOUS

## 2014-12-21 NOTE — Telephone Encounter (Signed)
Patient's wife called to say that he has had a cough, which is new.  He has no fever and no other symptoms.  Even though he has an appointment on 12/23/14 to see Dr. Alvy Bimler, she is insistent that he see someone today.  Scheduled for [facility] with labs (CBC, CMET).

## 2014-12-22 ENCOUNTER — Encounter: Payer: Self-pay | Admitting: Nurse Practitioner

## 2014-12-22 DIAGNOSIS — J40 Bronchitis, not specified as acute or chronic: Secondary | ICD-10-CM | POA: Insufficient documentation

## 2014-12-22 DIAGNOSIS — D709 Neutropenia, unspecified: Secondary | ICD-10-CM | POA: Insufficient documentation

## 2014-12-22 NOTE — Progress Notes (Signed)
SYMPTOM MANAGEMENT CLINIC   HPI: Keith Forbes 49 y.o. male diagnosed with Hodgkin's lymphoma.  Currently undergoing ABVD chemotherapy therapy regimen.   Patient called the cancer Center today requesting urgent care visit.  Patient is complaining of an occasional productive cough and congestion for the past several days.  He denies any chest pain, chest pressure, or shortness of breath.  He does complain of some very mild upper chest pain with inspiration.  He denies any URI symptoms.  He denies any recent fevers or chills.  HPI  ROS  Past Medical History  Diagnosis Date  . Hernia   . Hyperlipidemia   . Hypercholesteremia   . Pars defect of lumbar spine     bilateral 2013 (Dr. Sherwood Gambler)  . GERD (gastroesophageal reflux disease)   . Wears contact lenses   . Cancer     hodgkin lymphoma  . Hodgkin lymphoma 09/11/2014  . Lymphoma     Past Surgical History  Procedure Laterality Date  . Lymph node biopsy  12/15  . Portacath placement Left 09/15/2014    Procedure: INSERTION PORT-A-CATH WITH ULTRA SOUND;  Surgeon: Fanny Skates, MD;  Location: [facility];  Service: General;  Laterality: Left;    has Inguinal hernia without mention of obstruction or gangrene, unilateral or unspecified, (not specified as recurrent); Hypercholesteremia; Pars defect of lumbar spine; Hodgkin lymphoma; Leukopenia due to antineoplastic chemotherapy; Elevated liver enzymes; Neuropathy due to chemotherapeutic drug; Mucositis due to chemotherapy; Blister of skin without infection; Weight loss; Bronchitis; and Neutropenia on his problem list.    has No Known Allergies.    Medication List       This list is accurate as of: 12/21/14 11:59 PM.  Always use your most recent med list.               acetaminophen 500 MG chewable tablet  Commonly known as:  TYLENOL  Chew 500 mg by mouth every 6 (six) hours as needed for pain.     azithromycin 250 MG tablet  Commonly known as:  ZITHROMAX  Z-PAK  Take 2 tabs (500 mg) PO on day # 1; then take 1 tab (250 mg) PO QD till gone.     celecoxib 200 MG capsule  Commonly known as:  CELEBREX  TAKE 1 CAPSULE (200 MG TOTAL) BY MOUTH DAILY.     dexlansoprazole 60 MG capsule  Commonly known as:  DEXILANT  Take 1 capsule (60 mg total) by mouth daily.     docusate sodium 100 MG capsule  Commonly known as:  COLACE  Take 100 mg by mouth 2 (two) times daily as needed for mild constipation.     lidocaine-prilocaine cream  Commonly known as:  EMLA  Apply to affected area once     ondansetron 8 MG tablet  Commonly known as:  ZOFRAN  Take 1 tablet (8 mg total) by mouth every 8 (eight) hours as needed for nausea or vomiting.     polyethylene glycol packet  Commonly known as:  MIRALAX / GLYCOLAX  Take 17 g by mouth daily.     pravastatin 20 MG tablet  Commonly known as:  PRAVACHOL  TAKE 1 TABLET BY MOUTH AT BEDTIME     prochlorperazine 10 MG tablet  Commonly known as:  COMPAZINE  Take 1 tablet (10 mg total) by mouth every 6 (six) hours as needed (Nausea or vomiting).     TRUBIOTICS PO  Take 1 capsule by mouth daily.  PHYSICAL EXAMINATION  Oncology Vitals 12/21/2014 12/09/2014 11/25/2014 11/11/2014 10/28/2014 10/14/2014 10/07/2014  Height - - 178 cm - 178 cm - -  Weight 81.784 kg - 80.604 kg - 81.738 kg - -  Weight (lbs) 180 lbs 5 oz - 177 lbs 11 oz - 180 lbs 3 oz - -  BMI (kg/m2) - - 25.5 kg/m2 - 25.86 kg/m2 - -  Temp 98.2 97.7 98.2 98.2 97.9 98.1 97.7  Pulse 72 64 67 69 65 60 71  Resp 20 - 18 18 18 18  -  SpO2 - - - 100 - 100 -  BSA (m2) - - 2 m2 - 2.01 m2 - -   BP Readings from Last 3 Encounters:  12/21/14 117/62  12/09/14 123/61  11/25/14 107/56    Physical Exam  Constitutional: He is oriented to person, place, and time and well-developed, well-nourished, and in no distress.  HENT:  Head: Normocephalic and atraumatic.  Mouth/Throat: Oropharynx is clear and moist.  Eyes: Conjunctivae and EOM are normal. Pupils are  equal, round, and reactive to light. Right eye exhibits no discharge. Left eye exhibits no discharge. No scleral icterus.  Neck: Neck supple. No JVD present. No tracheal deviation present. No thyromegaly present.  Cardiovascular: Normal rate, regular rhythm, normal heart sounds and intact distal pulses.   Pulmonary/Chest: Effort normal and breath sounds normal. No respiratory distress. He has no wheezes. He has no rales. He exhibits no tenderness.  Abdominal: Soft. Bowel sounds are normal. He exhibits no distension and no mass. There is no tenderness. There is no rebound and no guarding.  Musculoskeletal: Normal range of motion. He exhibits no edema or tenderness.  Lymphadenopathy:    He has no cervical adenopathy.  Neurological: He is alert and oriented to person, place, and time. Gait normal.  Skin: Skin is warm and dry. No rash noted. No erythema. No pallor.  Psychiatric: Affect normal.  Nursing note and vitals reviewed.   LABORATORY DATA:. Appointment on 12/21/2014  Component Date Value Ref Range Status  . WBC 12/21/2014 1.7* 4.0 - 10.3 10e3/uL Final  . NEUT# 12/21/2014 0.4* 1.5 - 6.5 10e3/uL Final  . HGB 12/21/2014 11.9* 13.0 - 17.1 g/dL Final  . HCT 12/21/2014 36.5* 38.4 - 49.9 % Final  . Platelets 12/21/2014 191  140 - 400 10e3/uL Final  . MCV 12/21/2014 86.2  79.3 - 98.0 fL Final  . MCH 12/21/2014 28.2  27.2 - 33.4 pg Final  . MCHC 12/21/2014 32.7  32.0 - 36.0 g/dL Final  . RBC 12/21/2014 4.23  4.20 - 5.82 10e6/uL Final  . RDW 12/21/2014 17.8* 11.0 - 14.6 % Final  . lymph# 12/21/2014 0.7* 0.9 - 3.3 10e3/uL Final  . MONO# 12/21/2014 0.3  0.1 - 0.9 10e3/uL Final  . Eosinophils Absolute 12/21/2014 0.2  0.0 - 0.5 10e3/uL Final  . Basophils Absolute 12/21/2014 0.1  0.0 - 0.1 10e3/uL Final  . NEUT% 12/21/2014 25.8* 39.0 - 75.0 % Final  . LYMPH% 12/21/2014 41.0  14.0 - 49.0 % Final  . MONO% 12/21/2014 19.6* 0.0 - 14.0 % Final  . EOS% 12/21/2014 9.3* 0.0 - 7.0 % Final  . BASO%  12/21/2014 4.3* 0.0 - 2.0 % Final  . Sodium 12/21/2014 140  136 - 145 mEq/L Final  . Potassium 12/21/2014 4.3  3.5 - 5.1 mEq/L Final  . Chloride 12/21/2014 104  98 - 109 mEq/L Final  . CO2 12/21/2014 26  22 - 29 mEq/L Final  . Glucose 12/21/2014 81  70 -  140 mg/dl Final  . BUN 12/21/2014 12.8  7.0 - 26.0 mg/dL Final  . Creatinine 12/21/2014 0.9  0.7 - 1.3 mg/dL Final  . Total Bilirubin 12/21/2014 1.11  0.20 - 1.20 mg/dL Final  . Alkaline Phosphatase 12/21/2014 97  40 - 150 U/L Final  . AST 12/21/2014 27  5 - 34 U/L Final  . ALT 12/21/2014 32  0 - 55 U/L Final  . Total Protein 12/21/2014 6.9  6.4 - 8.3 g/dL Final  . Albumin 12/21/2014 4.0  3.5 - 5.0 g/dL Final  . Calcium 12/21/2014 9.4  8.4 - 10.4 mg/dL Final  . Anion Gap 12/21/2014 10  3 - 11 mEq/L Final  . EGFR 12/21/2014 >90  >90 ml/min/1.73 m2 Final   eGFR is calculated using the CKD-EPI Creatinine Equation (2009)     RADIOGRAPHIC STUDIES: Ct Angio Chest Pe W/cm &/or Wo Cm  12/21/2014   CLINICAL DATA:  Chest pain with inspiration. History of Hodgkin's lymphoma. Evaluate for pulmonary embolism.  EXAM: CT ANGIOGRAPHY CHEST WITH CONTRAST  TECHNIQUE: Multidetector CT imaging of the chest was performed using the standard protocol during bolus administration of intravenous contrast. Multiplanar CT image reconstructions and MIPs were obtained to evaluate the vascular anatomy.  CONTRAST:  161m OMNIPAQUE IOHEXOL 350 MG/ML SOLN  COMPARISON:  PET-CT -11/23/2014  FINDINGS: Vascular Findings:  There is adequate opacification of the pulmonary arterial system with the main pulmonary artery measuring 244 Hounsfield units. No discrete filling defects within the pulmonary arterial tree to suggest pulmonary embolism. Normal caliber the main pulmonary artery.  Normal heart size. Coronary artery calcifications. No pericardial effusion. Normal caliber of the thoracic aorta. Conventional configuration of the aortic arch. The branch vessels of the aortic arch are  widely patent throughout their imaged course.  Left subclavian vein approach port a catheter with tip terminating within the superior aspect of the right atrium.  Review of the MIP images confirms the above findings.   ----------------------------------------------------------------------------------  Nonvascular Findings:  Unchanged biapical pleural parenchymal thickening. Unchanged ill-defined reticular opacities within the subpleural aspect of the right upper lobe (image 38, series 4), similar to PET-CT performed 11/2014. No new discrete focal airspace opacities. No pleural effusion or pneumothorax. The central pulmonary airways appear widely patent.  Enlarged right internal mamillary lymph node is unchanged measuring 1.5 cm in greatest short axis diameter (image 36, series 4), as is subcarinal lymph node measuring 0.8 cm (image 39, series 4). Both of these nodules were noted to demonstrate mildly increased metabolic activity on recently performed PET-CT. Additional scattered shotty mediastinal and hilar lymph nodes are unchanged with index precarinal lymph node measuring 0.9 cm in greatest short axis diameter (image 36, series 4), shotty right suprahilar nodal conglomeration measuring 1.3 cm (image 40) and left suprahilar nodal conglomeration measuring 0.8 cm. No axillary lymphadenopathy.  Limited early arterial phase evaluation of the upper abdomen demonstrates a small splenule.  No acute or aggressive osseous abnormalities. Regional soft tissues appear normal. Normal appearance of the imaged portions of the thyroid gland.  IMPRESSION: 1. No acute cardiopulmonary disease. Specifically, no evidence of pulmonary embolism. 2. Similar findings compatible with provided history of Hodgkin's lymphoma including solitary enlarged right internal mamillary lymph node with additional shoddy mediastinal and hilar lymph nodes as detailed above, grossly unchanged since PET-CT performed 11/23/2014. No worsening adenopathy  within the chest or imaged upper abdomen.   Electronically Signed   By: JSandi MariscalM.D.   On: 12/21/2014 16:18    ASSESSMENT/PLAN:  Bronchitis Patient complaining of occasionally productive cough since last week.  He denies any chest pain, chest pressure, or shortness of breath.  He does complain of some mild upper chest discomfort when he takes a deep breath.  He denies any URI symptoms.  He denies any recent fevers or chills.  On exam-bilateral lung sounds clear; with no wheezing or cough noted.  O2 sat 100% on room air.  2 to complain of pain with inspiration-patient obtained a CT angiogram of the chest which was negative for pulmonary embolism or other acute findings.  Will treat patient for mild bronchitis symptoms with Zithromax antibiotics; and also advised patient to try an over-the-counter allergy medicine such as Claritin or Zyrtec.  Advised patient to let us know if he develops any worsening symptoms whatsoever.   Hodgkin lymphoma Patient received cycle 3, day 15 of his ABVD chemotherapy regimen on 12/09/2014.  Patient remains neutropenic; but will continue to hold any growth factor support/Neulasta due to recommendation from [facility].  Patient has plans to return on 12/23/2014 for his next cycle of the same chemotherapy regimen.   Neutropenia WBC is 1.7 and ANC is 0.4.  Patient is complaining of some very mild bronchitis-like cough; but denies any fever or chills.  Once again reviewed all neutropenia guidelines with the patient.  He was wearing a mask today while at the cancer center.  Advised patient to closely monitor his symptoms; and let us know if he develops any worsening symptoms whatsoever.   Patient stated understanding of all instructions; and was in agreement with this plan of care. The patient knows to call the clinic with any problems, questions or concerns.   Review/collaboration with Dr. Alvy Bimler regarding all aspects of patient's visit today.   Total  time spent with patient was 40 minutes;  with greater than 75 percent of that time spent in face to face counseling regarding patient's symptoms,  and coordination of care and follow up.  Disclaimer: This note was dictated with voice recognition software. Similar sounding words can inadvertently be transcribed and may not be corrected upon review.   Drue Second, NP 12/22/2014

## 2014-12-22 NOTE — Assessment & Plan Note (Signed)
WBC is 1.7 and ANC is 0.4.  Patient is complaining of some very mild bronchitis-like cough; but denies any fever or chills.  Once again reviewed all neutropenia guidelines with the patient.  He was wearing a mask today while at the cancer center.  Advised patient to closely monitor his symptoms; and let us know if he develops any worsening symptoms whatsoever.

## 2014-12-22 NOTE — Assessment & Plan Note (Signed)
Patient complaining of occasionally productive cough since last week.  He denies any chest pain, chest pressure, or shortness of breath.  He does complain of some mild upper chest discomfort when he takes a deep breath.  He denies any URI symptoms.  He denies any recent fevers or chills.  On exam-bilateral lung sounds clear; with no wheezing or cough noted.  O2 sat 100% on room air.  2 to complain of pain with inspiration-patient obtained a CT angiogram of the chest which was negative for pulmonary embolism or other acute findings.  Will treat patient for mild bronchitis symptoms with Zithromax antibiotics; and also advised patient to try an over-the-counter allergy medicine such as Claritin or Zyrtec.  Advised patient to let us know if he develops any worsening symptoms whatsoever.

## 2014-12-22 NOTE — Assessment & Plan Note (Signed)
Patient received cycle 3, day 15 of his ABVD chemotherapy regimen on 12/09/2014.  Patient remains neutropenic; but will continue to hold any growth factor support/Neulasta due to recommendation from [facility].  Patient has plans to return on 12/23/2014 for his next cycle of the same chemotherapy regimen.

## 2014-12-23 ENCOUNTER — Ambulatory Visit (HOSPITAL_BASED_OUTPATIENT_CLINIC_OR_DEPARTMENT_OTHER): Payer: 59

## 2014-12-23 ENCOUNTER — Ambulatory Visit: Payer: 59

## 2014-12-23 ENCOUNTER — Telehealth: Payer: Self-pay | Admitting: Hematology and Oncology

## 2014-12-23 ENCOUNTER — Other Ambulatory Visit: Payer: Self-pay | Admitting: Hematology and Oncology

## 2014-12-23 ENCOUNTER — Ambulatory Visit (HOSPITAL_BASED_OUTPATIENT_CLINIC_OR_DEPARTMENT_OTHER): Payer: 59 | Admitting: Hematology and Oncology

## 2014-12-23 ENCOUNTER — Telehealth: Payer: Self-pay | Admitting: *Deleted

## 2014-12-23 ENCOUNTER — Other Ambulatory Visit (HOSPITAL_BASED_OUTPATIENT_CLINIC_OR_DEPARTMENT_OTHER): Payer: 59

## 2014-12-23 ENCOUNTER — Encounter: Payer: Self-pay | Admitting: Hematology and Oncology

## 2014-12-23 VITALS — BP 104/53 | HR 74 | Temp 98.3°F | Resp 18 | Ht 70.0 in | Wt 179.2 lb

## 2014-12-23 DIAGNOSIS — R05 Cough: Secondary | ICD-10-CM

## 2014-12-23 DIAGNOSIS — Z95828 Presence of other vascular implants and grafts: Secondary | ICD-10-CM

## 2014-12-23 DIAGNOSIS — D701 Agranulocytosis secondary to cancer chemotherapy: Secondary | ICD-10-CM | POA: Diagnosis not present

## 2014-12-23 DIAGNOSIS — R059 Cough, unspecified: Secondary | ICD-10-CM

## 2014-12-23 DIAGNOSIS — R51 Headache: Secondary | ICD-10-CM

## 2014-12-23 DIAGNOSIS — C8190 Hodgkin lymphoma, unspecified, unspecified site: Secondary | ICD-10-CM

## 2014-12-23 DIAGNOSIS — J40 Bronchitis, not specified as acute or chronic: Secondary | ICD-10-CM

## 2014-12-23 DIAGNOSIS — T451X5A Adverse effect of antineoplastic and immunosuppressive drugs, initial encounter: Secondary | ICD-10-CM

## 2014-12-23 DIAGNOSIS — R519 Headache, unspecified: Secondary | ICD-10-CM

## 2014-12-23 DIAGNOSIS — Z5111 Encounter for antineoplastic chemotherapy: Secondary | ICD-10-CM

## 2014-12-23 DIAGNOSIS — R0602 Shortness of breath: Secondary | ICD-10-CM | POA: Diagnosis not present

## 2014-12-23 LAB — COMPREHENSIVE METABOLIC PANEL (CC13)
ALT: 29 U/L (ref 0–55)
ANION GAP: 10 meq/L (ref 3–11)
AST: 26 U/L (ref 5–34)
Albumin: 3.7 g/dL (ref 3.5–5.0)
Alkaline Phosphatase: 98 U/L (ref 40–150)
BILIRUBIN TOTAL: 1.25 mg/dL — AB (ref 0.20–1.20)
BUN: 11.5 mg/dL (ref 7.0–26.0)
CO2: 25 meq/L (ref 22–29)
CREATININE: 0.8 mg/dL (ref 0.7–1.3)
Calcium: 8.7 mg/dL (ref 8.4–10.4)
Chloride: 105 mEq/L (ref 98–109)
GLUCOSE: 97 mg/dL (ref 70–140)
Potassium: 4 mEq/L (ref 3.5–5.1)
Sodium: 139 mEq/L (ref 136–145)
Total Protein: 6.3 g/dL — ABNORMAL LOW (ref 6.4–8.3)

## 2014-12-23 LAB — CBC WITH DIFFERENTIAL/PLATELET
BASO%: 4.8 % — ABNORMAL HIGH (ref 0.0–2.0)
Basophils Absolute: 0.1 10*3/uL (ref 0.0–0.1)
EOS ABS: 0.2 10*3/uL (ref 0.0–0.5)
EOS%: 9 % — ABNORMAL HIGH (ref 0.0–7.0)
HCT: 33.8 % — ABNORMAL LOW (ref 38.4–49.9)
HGB: 11.6 g/dL — ABNORMAL LOW (ref 13.0–17.1)
LYMPH%: 50.6 % — AB (ref 14.0–49.0)
MCH: 29.2 pg (ref 27.2–33.4)
MCHC: 34.3 g/dL (ref 32.0–36.0)
MCV: 85.1 fL (ref 79.3–98.0)
MONO#: 0.4 10*3/uL (ref 0.1–0.9)
MONO%: 25.3 % — ABNORMAL HIGH (ref 0.0–14.0)
NEUT%: 10.3 % — AB (ref 39.0–75.0)
NEUTROS ABS: 0.2 10*3/uL — AB (ref 1.5–6.5)
NRBC: 0 % (ref 0–0)
PLATELETS: 160 10*3/uL (ref 140–400)
RBC: 3.97 10*6/uL — AB (ref 4.20–5.82)
RDW: 16.5 % — ABNORMAL HIGH (ref 11.0–14.6)
WBC: 1.7 10*3/uL — ABNORMAL LOW (ref 4.0–10.3)
lymph#: 0.8 10*3/uL — ABNORMAL LOW (ref 0.9–3.3)

## 2014-12-23 MED ORDER — DOXORUBICIN HCL CHEMO IV INJECTION 2 MG/ML
25.0000 mg/m2 | Freq: Once | INTRAVENOUS | Status: AC
  Administered 2014-12-23: 52 mg via INTRAVENOUS
  Filled 2014-12-23: qty 26

## 2014-12-23 MED ORDER — SODIUM CHLORIDE 0.9 % IV SOLN
Freq: Once | INTRAVENOUS | Status: AC
  Administered 2014-12-23: 12:00:00 via INTRAVENOUS

## 2014-12-23 MED ORDER — SODIUM CHLORIDE 0.9 % IV SOLN: 337.5000 mg/m2 | Freq: Once | INTRAVENOUS | Status: DC

## 2014-12-23 MED ORDER — HEPARIN SOD (PORK) LOCK FLUSH 100 UNIT/ML IV SOLN
500.0000 [IU] | Freq: Once | INTRAVENOUS | Status: AC | PRN
  Administered 2014-12-23: 500 [IU]
  Filled 2014-12-23: qty 5

## 2014-12-23 MED ORDER — SODIUM CHLORIDE 0.9 % IJ SOLN
10.0000 mL | INTRAMUSCULAR | Status: DC | PRN
  Administered 2014-12-23: 10 mL via INTRAVENOUS
  Filled 2014-12-23: qty 10

## 2014-12-23 MED ORDER — SODIUM CHLORIDE 0.9 % IV SOLN
375.0000 mg/m2 | Freq: Once | INTRAVENOUS | Status: AC
  Administered 2014-12-23: 780 mg via INTRAVENOUS
  Filled 2014-12-23: qty 39

## 2014-12-23 MED ORDER — SODIUM CHLORIDE 0.9 % IV SOLN
5.8000 mg/m2 | Freq: Once | INTRAVENOUS | Status: AC
  Administered 2014-12-23: 12 mg via INTRAVENOUS
  Filled 2014-12-23: qty 12

## 2014-12-23 MED ORDER — SODIUM CHLORIDE 0.9 % IJ SOLN
10.0000 mL | INTRAMUSCULAR | Status: DC | PRN
  Administered 2014-12-23: 10 mL
  Filled 2014-12-23: qty 10

## 2014-12-23 MED ORDER — DEXAMETHASONE SODIUM PHOSPHATE 100 MG/10ML IJ SOLN
Freq: Once | INTRAMUSCULAR | Status: AC
  Administered 2014-12-23: 12:00:00 via INTRAVENOUS
  Filled 2014-12-23: qty 4

## 2014-12-23 NOTE — Patient Instructions (Signed)
[facility] Discharge Instructions for Patients Receiving Chemotherapy  Today you received the following chemotherapy agents AVD.  To help prevent nausea and vomiting after your treatment, we encourage you to take your nausea medication as directed.    If you develop nausea and vomiting that is not controlled by your nausea medication, call the clinic.   BELOW ARE SYMPTOMS THAT SHOULD BE REPORTED IMMEDIATELY:  *FEVER GREATER THAN 100.5 F  *CHILLS WITH OR WITHOUT FEVER  NAUSEA AND VOMITING THAT IS NOT CONTROLLED WITH YOUR NAUSEA MEDICATION  *UNUSUAL SHORTNESS OF BREATH  *UNUSUAL BRUISING OR BLEEDING  TENDERNESS IN MOUTH AND THROAT WITH OR WITHOUT PRESENCE OF ULCERS  *URINARY PROBLEMS  *BOWEL PROBLEMS  UNUSUAL RASH Items with * indicate a potential emergency and should be followed up as soon as possible.  Feel free to call the clinic you have any questions or concerns. The clinic phone number is [phone].  Please show the [facility] at check-in to the Emergency Department and triage nurse.

## 2014-12-23 NOTE — Assessment & Plan Note (Signed)
His symptoms of cough and shortness of breath are highly suspicious of bleomycin lung toxicity. Recent CT scan showed no evidence of pulmonary emboli. I recommend repeat pulmonary function tests and stop bleomycin altogether.

## 2014-12-23 NOTE — Progress Notes (Signed)
[facility] OFFICE PROGRESS NOTE  Patient Care Team: [name], MD as PCP - General (Family Medicine)  SUMMARY OF ONCOLOGIC HISTORY: Oncology History   Hodgkin lymphoma   Staging form: Lymphoid Neoplasms, AJCC 6th Edition     Clinical stage from 09/13/2014: Stage III - Signed by [name], MD on 09/13/2014       Hodgkin lymphoma   09/04/2014 Pathology Results Accession: [id number] inguinal lymph node biopsy confirmed Hodgkin lymphoma   09/04/2014 Procedure Dr. Dalbert Batman performed excision of left inguinal lymph node biopsy   09/11/2014 Imaging CT scan of the chest, abdomen and pelvis show disease both above and below the diaphragm   09/16/2014 Imaging Echocardiogram is negative   09/18/2014 Bone Marrow Biopsy Bone marrow aspirate and biopsy is negative   09/22/2014 -  Chemotherapy  the patient was started on  ABVD   10/06/2014 Adverse Reaction  treatment was delayed due to severe leukopenia. Future dose modification was made for abnormal liver function tests, leukopenia and neuropathy   11/23/2014 Imaging PET CT scan showed near complete response to Rx   12/23/2014 Adverse Reaction Bleomycin was discontinued due to suspected bleomycin toxicity    INTERVAL HISTORY: Please see below for problem oriented charting. He is seen today due prior to cycle 4 of treatment. Last week, he complain of significant shortness of breath and cough. CT angiogram showed no evidence of PE. The patient was observed. He denies recent signs of infection. He also complained of headaches for several days after chemotherapy and difficulties with sleeping. He also complained of new callus around his right big toe does not bother him. Denies significant neuropathy.  REVIEW OF SYSTEMS:   Constitutional: Denies fevers, chills or abnormal weight loss Eyes: Denies blurriness of vision Ears, nose, mouth, throat, and face: Denies mucositis or sore throat Cardiovascular: Denies palpitation, chest  discomfort or lower extremity swelling Gastrointestinal:  Denies nausea, heartburn or change in bowel habits Skin: Denies abnormal skin rashes Lymphatics: Denies new lymphadenopathy or easy bruising Neurological:Denies numbness, tingling or new weaknesses Behavioral/Psych: Mood is stable, no new changes  All other systems were reviewed with the patient and are negative.  I have reviewed the past medical history, past surgical history, social history and family history with the patient and they are unchanged from previous note.  ALLERGIES:  has No Known Allergies.  MEDICATIONS:  Current Outpatient Prescriptions  Medication Sig Dispense Refill  . acetaminophen (TYLENOL) 500 MG chewable tablet Chew 500 mg by mouth every 6 (six) hours as needed for pain.    Marland Kitchen azithromycin (ZITHROMAX Z-PAK) 250 MG tablet Take 2 tabs (500 mg) PO on day # 1; then take 1 tab (250 mg) PO QD till gone. 6 each 0  . celecoxib (CELEBREX) 200 MG capsule TAKE 1 CAPSULE (200 MG TOTAL) BY MOUTH DAILY. 30 capsule 5  . dexlansoprazole (DEXILANT) 60 MG capsule Take 1 capsule (60 mg total) by mouth daily. 30 capsule 11  . docusate sodium (COLACE) 100 MG capsule Take 100 mg by mouth 2 (two) times daily as needed for mild constipation.    . lidocaine-prilocaine (EMLA) cream Apply to affected area once 30 g 3  . ondansetron (ZOFRAN) 8 MG tablet Take 1 tablet (8 mg total) by mouth every 8 (eight) hours as needed for nausea or vomiting. 30 tablet 1  . polyethylene glycol (MIRALAX / GLYCOLAX) packet Take 17 g by mouth daily.    . pravastatin (PRAVACHOL) 20 MG tablet TAKE 1 TABLET BY MOUTH  AT BEDTIME 30 tablet 5  . Probiotic Product (TRUBIOTICS PO) Take 1 capsule by mouth daily.    . prochlorperazine (COMPAZINE) 10 MG tablet Take 1 tablet (10 mg total) by mouth every 6 (six) hours as needed (Nausea or vomiting). 30 tablet 1   No current facility-administered medications for this visit.   Facility-Administered Medications Ordered in  Other Visits  Medication Dose Route Frequency Provider Last Rate Last Dose  . sodium chloride 0.9 % injection 10 mL  10 mL Intracatheter PRN [name], MD   10 mL at 12/23/14 1402    PHYSICAL EXAMINATION: ECOG PERFORMANCE STATUS: 1 - Symptomatic but completely ambulatory  Filed Vitals:   12/23/14 0958  BP: 104/53  Pulse: 74  Temp: 98.3 F (36.8 C)  Resp: 18   Filed Weights   12/23/14 0958  Weight: 179 lb 3.2 oz (81.285 kg)    GENERAL:alert, no distress and comfortable SKIN: skin color, texture, turgor are normal, no rashes or significant lesions EYES: normal, Conjunctiva are pink and non-injected, sclera clear OROPHARYNX:no exudate, no erythema and lips, buccal mucosa, and tongue normal  NECK: supple, thyroid normal size, non-tender, without nodularity LYMPH:  no palpable lymphadenopathy in the cervical, axillary or inguinal LUNGS: clear to auscultation and percussion with normal breathing effort HEART: regular rate & rhythm and no murmurs and no lower extremity edema ABDOMEN:abdomen soft, non-tender and normal bowel sounds Musculoskeletal:no cyanosis of digits and no clubbing  NEURO: alert & oriented x 3 with fluent speech, no focal motor/sensory deficits  LABORATORY DATA:  I have reviewed the data as listed    Component Value Date/Time   NA 139 12/23/2014 0937   NA 136 09/24/2014 1724   K 4.0 12/23/2014 0937   K 3.7 09/24/2014 1724   CL 102 09/24/2014 1724   CO2 25 12/23/2014 0937   CO2 25 09/24/2014 1724   GLUCOSE 97 12/23/2014 0937   GLUCOSE 112* 09/24/2014 1724   BUN 11.5 12/23/2014 0937   BUN 23 09/24/2014 1724   CREATININE 0.8 12/23/2014 0937   CREATININE 0.89 09/24/2014 1724   CREATININE 1.00 09/11/2014 0808   CALCIUM 8.7 12/23/2014 0937   CALCIUM 8.8 09/24/2014 1724   PROT 6.3* 12/23/2014 0937   PROT 7.4 09/24/2014 1724   ALBUMIN 3.7 12/23/2014 0937   ALBUMIN 3.5 09/24/2014 1724   AST 26 12/23/2014 0937   AST 29 09/24/2014 1724   ALT 29 12/23/2014  0937   ALT 30 09/24/2014 1724   ALKPHOS 98 12/23/2014 0937   ALKPHOS 147* 09/24/2014 1724   BILITOT 1.25* 12/23/2014 0937   BILITOT 0.9 09/24/2014 1724   GFRNONAA >90 09/24/2014 1724   GFRNONAA 89 09/11/2014 0808   GFRAA >90 09/24/2014 1724   GFRAA >89 09/11/2014 0808    No results found for: SPEP, UPEP  Lab Results  Component Value Date   WBC 1.7* 12/23/2014   NEUTROABS 0.2* 12/23/2014   HGB 11.6* 12/23/2014   HCT 33.8* 12/23/2014   MCV 85.1 12/23/2014   PLT 160 12/23/2014      Chemistry      Component Value Date/Time   NA 139 12/23/2014 0937   NA 136 09/24/2014 1724   K 4.0 12/23/2014 0937   K 3.7 09/24/2014 1724   CL 102 09/24/2014 1724   CO2 25 12/23/2014 0937   CO2 25 09/24/2014 1724   BUN 11.5 12/23/2014 0937   BUN 23 09/24/2014 1724   CREATININE 0.8 12/23/2014 0937   CREATININE 0.89 09/24/2014 1724   CREATININE  1.00 09/11/2014 0808      Component Value Date/Time   CALCIUM 8.7 12/23/2014 0937   CALCIUM 8.8 09/24/2014 1724   ALKPHOS 98 12/23/2014 0937   ALKPHOS 147* 09/24/2014 1724   AST 26 12/23/2014 0937   AST 29 09/24/2014 1724   ALT 29 12/23/2014 0937   ALT 30 09/24/2014 1724   BILITOT 1.25* 12/23/2014 0937   BILITOT 0.9 09/24/2014 1724       RADIOGRAPHIC STUDIES: I reviewed the most recent CT scan. I have personally reviewed the radiological images as listed and agreed with the findings in the report.  ASSESSMENT & PLAN:  Hodgkin lymphoma He had extensive workup including CT angiogram last week which show no evidence of pulmonary emboli. His symptoms of coughing and shortness of breath is highly suspicious of possible early signs of bleomycin lung toxicity. I will schedule pulmonary function tests this week and to discontinue bleomycin altogether. Recent CT scan showed no evidence of lung inflammation. I will observe and see how he does this month. If his coughing and shortness of breath does not improve, he might benefit from short-term  steroid treatment.   Leukopenia due to antineoplastic chemotherapy This is related to side effects of treatment. Per recommendation from [facility], we will proceed regardless of [place] and will not cover him with growth factor support.   Cough His symptoms of cough and shortness of breath are highly suspicious of bleomycin lung toxicity. Recent CT scan showed no evidence of pulmonary emboli. I recommend repeat pulmonary function tests and stop bleomycin altogether.   Headache disorder He has symptoms of headache after chemotherapy, for several days afterwards which I suspect could be due to chemotherapy. I recommend reducing the dose of dexamethasone and Zofran by 50% to see if this would help and he agreed with the plan of care.    No orders of the defined types were placed in this encounter.   All questions were answered. The patient knows to call the clinic with any problems, questions or concerns. No barriers to learning was detected. I spent 30 minutes counseling the patient face to face. The total time spent in the appointment was 40 minutes and more than 50% was on counseling and review of test results     [facility], Martia Dalby, MD 12/23/2014 5:10 PM

## 2014-12-23 NOTE — Assessment & Plan Note (Signed)
This is related to side effects of treatment. Per recommendation from [facility], we will proceed regardless of [place] and will not cover him with growth factor support.

## 2014-12-23 NOTE — Patient Instructions (Signed)

## 2014-12-23 NOTE — Assessment & Plan Note (Signed)
He has symptoms of headache after chemotherapy, for several days afterwards which I suspect could be due to chemotherapy. I recommend reducing the dose of dexamethasone and Zofran by 50% to see if this would help and he agreed with the plan of care.

## 2014-12-23 NOTE — Telephone Encounter (Signed)
Per staff message and POF I have scheduled appts. Advised scheduler of appts. JMW  

## 2014-12-23 NOTE — Assessment & Plan Note (Signed)
He had extensive workup including CT angiogram last week which show no evidence of pulmonary emboli. His symptoms of coughing and shortness of breath is highly suspicious of possible early signs of bleomycin lung toxicity. I will schedule pulmonary function tests this week and to discontinue bleomycin altogether. Recent CT scan showed no evidence of lung inflammation. I will observe and see how he does this month. If his coughing and shortness of breath does not improve, he might benefit from short-term steroid treatment.

## 2014-12-23 NOTE — Progress Notes (Signed)
Adriamycin administered through a free dripping normal saline line.Positive blood return noted before, every 3 mL during and after adriamycin administration. Patient tolerated well.

## 2014-12-23 NOTE — Telephone Encounter (Signed)
Gave avs & calendar for March/April/May. Sent message to schedule treatment.

## 2014-12-24 ENCOUNTER — Telehealth: Payer: Self-pay | Admitting: *Deleted

## 2014-12-24 NOTE — Telephone Encounter (Signed)
Called pt at home to f/u with visit 12/21/14 with Selena Lesser NP.  Pt has seen Dr Alvy Bimler since this visit & PFT's ordered.  These will be done tomorrow.  Pt reports feeling about the same.  Informed that he could call if any problems or concerns.

## 2014-12-25 ENCOUNTER — Ambulatory Visit (HOSPITAL_COMMUNITY)
Admission: RE | Admit: 2014-12-25 | Discharge: 2014-12-25 | Disposition: A | Discharge status: Home / Self Care | Payer: 59 | Source: Ambulatory Visit | Attending: Hematology and Oncology | Admitting: Hematology and Oncology

## 2014-12-25 DIAGNOSIS — C8190 Hodgkin lymphoma, unspecified, unspecified site: Secondary | ICD-10-CM | POA: Diagnosis not present

## 2014-12-25 LAB — PULMONARY FUNCTION TEST
DL/VA % PRED: 87 %
DL/VA: 4.08 ml/min/mmHg/L
DLCO cor % pred: 86 %
DLCO cor: 27.97 ml/min/mmHg
DLCO unc % pred: 78 %
DLCO unc: 25.27 ml/min/mmHg
FEF 25-75 POST: 5.29 L/s
FEF 25-75 PRE: 4.42 L/s
FEF2575-%CHANGE-POST: 19 %
FEF2575-%PRED-POST: 148 %
FEF2575-%PRED-PRE: 124 %
FEV1-%Change-Post: 7 %
FEV1-%Pred-Post: 115 %
FEV1-%Pred-Pre: 107 %
FEV1-POST: 4.61 L
FEV1-PRE: 4.3 L
FEV1FVC-%Change-Post: 5 %
FEV1FVC-%PRED-PRE: 102 %
FEV6-%Change-Post: 2 %
FEV6-%PRED-POST: 110 %
FEV6-%Pred-Pre: 107 %
FEV6-Post: 5.45 L
FEV6-Pre: 5.31 L
FEV6FVC-%Change-Post: 0 %
FEV6FVC-%Pred-Post: 103 %
FEV6FVC-%Pred-Pre: 102 %
FVC-%Change-Post: 1 %
FVC-%PRED-POST: 106 %
FVC-%Pred-Pre: 104 %
FVC-POST: 5.45 L
FVC-Pre: 5.36 L
Post FEV1/FVC ratio: 85 %
Post FEV6/FVC ratio: 100 %
Pre FEV1/FVC ratio: 80 %
Pre FEV6/FVC Ratio: 100 %
RV % pred: 89 %
RV: 1.8 L
TLC % pred: 101 %
TLC: 7.04 L

## 2014-12-25 MED ORDER — ALBUTEROL SULFATE (2.5 MG/3ML) 0.083% IN NEBU
2.5000 mg | INHALATION_SOLUTION | Freq: Once | RESPIRATORY_TRACT | Status: AC
  Administered 2014-12-25: 2.5 mg via RESPIRATORY_TRACT

## 2015-01-06 ENCOUNTER — Ambulatory Visit: Payer: 59

## 2015-01-06 ENCOUNTER — Other Ambulatory Visit: Payer: Self-pay | Admitting: Hematology and Oncology

## 2015-01-06 ENCOUNTER — Ambulatory Visit (HOSPITAL_BASED_OUTPATIENT_CLINIC_OR_DEPARTMENT_OTHER): Payer: 59

## 2015-01-06 ENCOUNTER — Other Ambulatory Visit (HOSPITAL_BASED_OUTPATIENT_CLINIC_OR_DEPARTMENT_OTHER): Payer: 59

## 2015-01-06 DIAGNOSIS — Z95828 Presence of other vascular implants and grafts: Secondary | ICD-10-CM

## 2015-01-06 DIAGNOSIS — Z5111 Encounter for antineoplastic chemotherapy: Secondary | ICD-10-CM

## 2015-01-06 DIAGNOSIS — C8190 Hodgkin lymphoma, unspecified, unspecified site: Secondary | ICD-10-CM

## 2015-01-06 LAB — COMPREHENSIVE METABOLIC PANEL (CC13)
ALT: 43 U/L (ref 0–55)
ANION GAP: 8 meq/L (ref 3–11)
AST: 29 U/L (ref 5–34)
Albumin: 3.6 g/dL (ref 3.5–5.0)
Alkaline Phosphatase: 83 U/L (ref 40–150)
BUN: 12.5 mg/dL (ref 7.0–26.0)
CALCIUM: 8.7 mg/dL (ref 8.4–10.4)
CHLORIDE: 109 meq/L (ref 98–109)
CO2: 26 meq/L (ref 22–29)
CREATININE: 0.8 mg/dL (ref 0.7–1.3)
Glucose: 97 mg/dl (ref 70–140)
Potassium: 4.2 mEq/L (ref 3.5–5.1)
SODIUM: 143 meq/L (ref 136–145)
Total Bilirubin: 0.77 mg/dL (ref 0.20–1.20)
Total Protein: 5.9 g/dL — ABNORMAL LOW (ref 6.4–8.3)

## 2015-01-06 LAB — CBC WITH DIFFERENTIAL/PLATELET
BASO%: 3.7 % — ABNORMAL HIGH (ref 0.0–2.0)
Basophils Absolute: 0.1 10*3/uL (ref 0.0–0.1)
EOS%: 11.7 % — ABNORMAL HIGH (ref 0.0–7.0)
Eosinophils Absolute: 0.2 10*3/uL (ref 0.0–0.5)
HCT: 33.4 % — ABNORMAL LOW (ref 38.4–49.9)
HGB: 11.4 g/dL — ABNORMAL LOW (ref 13.0–17.1)
LYMPH%: 49.7 % — ABNORMAL HIGH (ref 14.0–49.0)
MCH: 29.9 pg (ref 27.2–33.4)
MCHC: 34.1 g/dL (ref 32.0–36.0)
MCV: 87.7 fL (ref 79.3–98.0)
MONO#: 0.4 10*3/uL (ref 0.1–0.9)
MONO%: 23.9 % — AB (ref 0.0–14.0)
NEUT#: 0.2 10*3/uL — CL (ref 1.5–6.5)
NEUT%: 11 % — ABNORMAL LOW (ref 39.0–75.0)
Platelets: 171 10*3/uL (ref 140–400)
RBC: 3.81 10*6/uL — AB (ref 4.20–5.82)
RDW: 17 % — AB (ref 11.0–14.6)
WBC: 1.6 10*3/uL — ABNORMAL LOW (ref 4.0–10.3)
lymph#: 0.8 10*3/uL — ABNORMAL LOW (ref 0.9–3.3)

## 2015-01-06 MED ORDER — SODIUM CHLORIDE 0.9 % IJ SOLN
10.0000 mL | INTRAMUSCULAR | Status: DC | PRN
  Administered 2015-01-06: 10 mL
  Filled 2015-01-06: qty 10

## 2015-01-06 MED ORDER — SODIUM CHLORIDE 0.9 % IV SOLN
Freq: Once | INTRAVENOUS | Status: AC
  Administered 2015-01-06: 09:00:00 via INTRAVENOUS

## 2015-01-06 MED ORDER — DOXORUBICIN HCL CHEMO IV INJECTION 2 MG/ML
25.0000 mg/m2 | Freq: Once | INTRAVENOUS | Status: AC
  Administered 2015-01-06: 52 mg via INTRAVENOUS
  Filled 2015-01-06: qty 26

## 2015-01-06 MED ORDER — DACARBAZINE 200 MG IV SOLR
375.0000 mg/m2 | Freq: Once | INTRAVENOUS | Status: AC
  Administered 2015-01-06: 780 mg via INTRAVENOUS
  Filled 2015-01-06: qty 39

## 2015-01-06 MED ORDER — SODIUM CHLORIDE 0.9 % IV SOLN
5.8000 mg/m2 | Freq: Once | INTRAVENOUS | Status: AC
  Administered 2015-01-06: 12 mg via INTRAVENOUS
  Filled 2015-01-06: qty 12

## 2015-01-06 MED ORDER — SODIUM CHLORIDE 0.9 % IV SOLN
Freq: Once | INTRAVENOUS | Status: AC
  Administered 2015-01-06: 10:00:00 via INTRAVENOUS
  Filled 2015-01-06: qty 4

## 2015-01-06 MED ORDER — HEPARIN SOD (PORK) LOCK FLUSH 100 UNIT/ML IV SOLN
500.0000 [IU] | Freq: Once | INTRAVENOUS | Status: AC | PRN
  Administered 2015-01-06: 500 [IU]
  Filled 2015-01-06: qty 5

## 2015-01-06 MED ORDER — SODIUM CHLORIDE 0.9 % IJ SOLN
10.0000 mL | INTRAMUSCULAR | Status: DC | PRN
  Administered 2015-01-06: 10 mL via INTRAVENOUS
  Filled 2015-01-06: qty 10

## 2015-01-06 NOTE — Progress Notes (Signed)
CBC reviewed with Dr. Alvy Bimler; OK to treat with WBC 1.6 and ANC 0.2

## 2015-01-06 NOTE — Patient Instructions (Signed)

## 2015-01-06 NOTE — Patient Instructions (Signed)
[place] Discharge Instructions for Patients Receiving Chemotherapy  Today you received the following chemotherapy agents Adriamycin, Vincristine, and DTIC  To help prevent nausea and vomiting after your treatment, we encourage you to take your nausea medication Compazine 10 mg every 6 hours or Zofran 8 mg every 8 hours as needed.  If you develop nausea and vomiting that is not controlled by your nausea medication, call the clinic.   BELOW ARE SYMPTOMS THAT SHOULD BE REPORTED IMMEDIATELY:  *FEVER GREATER THAN 100.5 F  *CHILLS WITH OR WITHOUT FEVER  NAUSEA AND VOMITING THAT IS NOT CONTROLLED WITH YOUR NAUSEA MEDICATION  *UNUSUAL SHORTNESS OF BREATH  *UNUSUAL BRUISING OR BLEEDING  TENDERNESS IN MOUTH AND THROAT WITH OR WITHOUT PRESENCE OF ULCERS  *URINARY PROBLEMS  *BOWEL PROBLEMS  UNUSUAL RASH Items with * indicate a potential emergency and should be followed up as soon as possible.  Feel free to call the clinic you have any questions or concerns. The clinic phone number is [phone].  Please show the [place] at check-in to the Emergency Department and triage nurse.

## 2015-01-20 ENCOUNTER — Telehealth: Payer: Self-pay | Admitting: Hematology and Oncology

## 2015-01-20 ENCOUNTER — Ambulatory Visit: Payer: 59

## 2015-01-20 ENCOUNTER — Ambulatory Visit (HOSPITAL_BASED_OUTPATIENT_CLINIC_OR_DEPARTMENT_OTHER): Payer: 59

## 2015-01-20 ENCOUNTER — Ambulatory Visit (HOSPITAL_BASED_OUTPATIENT_CLINIC_OR_DEPARTMENT_OTHER): Payer: 59 | Admitting: Hematology and Oncology

## 2015-01-20 ENCOUNTER — Other Ambulatory Visit (HOSPITAL_BASED_OUTPATIENT_CLINIC_OR_DEPARTMENT_OTHER): Payer: 59

## 2015-01-20 VITALS — BP 105/73 | HR 64 | Temp 98.0°F | Resp 18 | Ht 70.0 in | Wt 184.3 lb

## 2015-01-20 DIAGNOSIS — D72819 Decreased white blood cell count, unspecified: Secondary | ICD-10-CM

## 2015-01-20 DIAGNOSIS — D701 Agranulocytosis secondary to cancer chemotherapy: Secondary | ICD-10-CM

## 2015-01-20 DIAGNOSIS — T451X5A Adverse effect of antineoplastic and immunosuppressive drugs, initial encounter: Secondary | ICD-10-CM

## 2015-01-20 DIAGNOSIS — C8190 Hodgkin lymphoma, unspecified, unspecified site: Secondary | ICD-10-CM | POA: Diagnosis not present

## 2015-01-20 DIAGNOSIS — K5909 Other constipation: Secondary | ICD-10-CM

## 2015-01-20 DIAGNOSIS — Z5111 Encounter for antineoplastic chemotherapy: Secondary | ICD-10-CM | POA: Diagnosis not present

## 2015-01-20 DIAGNOSIS — G4440 Drug-induced headache, not elsewhere classified, not intractable: Secondary | ICD-10-CM

## 2015-01-20 DIAGNOSIS — Z95828 Presence of other vascular implants and grafts: Secondary | ICD-10-CM

## 2015-01-20 DIAGNOSIS — K5900 Constipation, unspecified: Secondary | ICD-10-CM

## 2015-01-20 DIAGNOSIS — R519 Headache, unspecified: Secondary | ICD-10-CM

## 2015-01-20 DIAGNOSIS — D630 Anemia in neoplastic disease: Secondary | ICD-10-CM

## 2015-01-20 DIAGNOSIS — R51 Headache: Secondary | ICD-10-CM

## 2015-01-20 LAB — CBC WITH DIFFERENTIAL/PLATELET
BASO%: 4.4 % — ABNORMAL HIGH (ref 0.0–2.0)
BASOS ABS: 0.1 10*3/uL (ref 0.0–0.1)
EOS%: 4.9 % (ref 0.0–7.0)
Eosinophils Absolute: 0.1 10*3/uL (ref 0.0–0.5)
HCT: 33.9 % — ABNORMAL LOW (ref 38.4–49.9)
HEMOGLOBIN: 11.6 g/dL — AB (ref 13.0–17.1)
LYMPH%: 49.1 % — ABNORMAL HIGH (ref 14.0–49.0)
MCH: 30.4 pg (ref 27.2–33.4)
MCHC: 34.2 g/dL (ref 32.0–36.0)
MCV: 89 fL (ref 79.3–98.0)
MONO#: 0.7 10*3/uL (ref 0.1–0.9)
MONO%: 31.9 % — ABNORMAL HIGH (ref 0.0–14.0)
NEUT#: 0.2 10*3/uL — CL (ref 1.5–6.5)
NEUT%: 9.7 % — ABNORMAL LOW (ref 39.0–75.0)
NRBC: 0 % (ref 0–0)
Platelets: 174 10*3/uL (ref 140–400)
RBC: 3.81 10*6/uL — AB (ref 4.20–5.82)
RDW: 16.7 % — AB (ref 11.0–14.6)
WBC: 2.3 10*3/uL — ABNORMAL LOW (ref 4.0–10.3)
lymph#: 1.1 10*3/uL (ref 0.9–3.3)

## 2015-01-20 LAB — COMPREHENSIVE METABOLIC PANEL (CC13)
ALBUMIN: 3.8 g/dL (ref 3.5–5.0)
ALK PHOS: 72 U/L (ref 40–150)
ALT: 32 U/L (ref 0–55)
ANION GAP: 11 meq/L (ref 3–11)
AST: 26 U/L (ref 5–34)
BUN: 11.2 mg/dL (ref 7.0–26.0)
CALCIUM: 9 mg/dL (ref 8.4–10.4)
CO2: 23 meq/L (ref 22–29)
Chloride: 107 mEq/L (ref 98–109)
Creatinine: 0.8 mg/dL (ref 0.7–1.3)
GLUCOSE: 94 mg/dL (ref 70–140)
POTASSIUM: 4.1 meq/L (ref 3.5–5.1)
Sodium: 141 mEq/L (ref 136–145)
Total Bilirubin: 0.8 mg/dL (ref 0.20–1.20)
Total Protein: 6.2 g/dL — ABNORMAL LOW (ref 6.4–8.3)

## 2015-01-20 MED ORDER — SODIUM CHLORIDE 0.9 % IJ SOLN
10.0000 mL | INTRAMUSCULAR | Status: DC | PRN
  Administered 2015-01-20: 10 mL via INTRAVENOUS
  Filled 2015-01-20: qty 10

## 2015-01-20 MED ORDER — SODIUM CHLORIDE 0.9 % IV SOLN
375.0000 mg/m2 | Freq: Once | INTRAVENOUS | Status: AC
  Administered 2015-01-20: 780 mg via INTRAVENOUS
  Filled 2015-01-20: qty 39

## 2015-01-20 MED ORDER — BUTALBITAL-APAP-CAFFEINE 50-325-40 MG PO TABS: 1.0000 | ORAL_TABLET | Freq: Four times a day (QID) | ORAL | Status: DC | PRN

## 2015-01-20 MED ORDER — SODIUM CHLORIDE 0.9 % IV SOLN
Freq: Once | INTRAVENOUS | Status: AC
  Administered 2015-01-20: 11:00:00 via INTRAVENOUS

## 2015-01-20 MED ORDER — HEPARIN SOD (PORK) LOCK FLUSH 100 UNIT/ML IV SOLN
500.0000 [IU] | Freq: Once | INTRAVENOUS | Status: AC | PRN
  Administered 2015-01-20: 500 [IU]
  Filled 2015-01-20: qty 5

## 2015-01-20 MED ORDER — SODIUM CHLORIDE 0.9 % IV SOLN
Freq: Once | INTRAVENOUS | Status: AC
  Administered 2015-01-20: 11:00:00 via INTRAVENOUS
  Filled 2015-01-20: qty 4

## 2015-01-20 MED ORDER — DOXORUBICIN HCL CHEMO IV INJECTION 2 MG/ML
25.0000 mg/m2 | Freq: Once | INTRAVENOUS | Status: AC
  Administered 2015-01-20: 52 mg via INTRAVENOUS
  Filled 2015-01-20: qty 26

## 2015-01-20 MED ORDER — VINBLASTINE SULFATE CHEMO INJECTION 1 MG/ML
5.8000 mg/m2 | Freq: Once | INTRAVENOUS | Status: AC
  Administered 2015-01-20: 12 mg via INTRAVENOUS
  Filled 2015-01-20: qty 12

## 2015-01-20 MED ORDER — SODIUM CHLORIDE 0.9 % IJ SOLN
10.0000 mL | INTRAMUSCULAR | Status: DC | PRN
  Administered 2015-01-20: 10 mL
  Filled 2015-01-20: qty 10

## 2015-01-20 NOTE — Patient Instructions (Addendum)
[place] Discharge Instructions for Patients Receiving Chemotherapy  Today you received the following chemotherapy agents: Adriamycin, Vinblastine, Dacarbazine.  To help prevent nausea and vomiting after your treatment, we encourage you to take your nausea medication as prescribed by your physician.   If you develop nausea and vomiting that is not controlled by your nausea medication, call the clinic.   BELOW ARE SYMPTOMS THAT SHOULD BE REPORTED IMMEDIATELY:  *FEVER GREATER THAN 100.5 F  *CHILLS WITH OR WITHOUT FEVER  NAUSEA AND VOMITING THAT IS NOT CONTROLLED WITH YOUR NAUSEA MEDICATION  *UNUSUAL SHORTNESS OF BREATH  *UNUSUAL BRUISING OR BLEEDING  TENDERNESS IN MOUTH AND THROAT WITH OR WITHOUT PRESENCE OF ULCERS  *URINARY PROBLEMS  *BOWEL PROBLEMS  UNUSUAL RASH Items with * indicate a potential emergency and should be followed up as soon as possible.  Feel free to call the clinic you have any questions or concerns. The clinic phone number is [phone].  Please show the [place] at check-in to the Emergency Department and triage nurse.

## 2015-01-20 NOTE — Progress Notes (Signed)
Okay to tx with today's labs per tx parameters despite ANC 0.2 and WBC 2.3.

## 2015-01-20 NOTE — Patient Instructions (Signed)

## 2015-01-20 NOTE — Telephone Encounter (Signed)
Gave avs & calendar for May. Sent message to schedule chemo.

## 2015-01-21 DIAGNOSIS — D630 Anemia in neoplastic disease: Secondary | ICD-10-CM | POA: Insufficient documentation

## 2015-01-21 DIAGNOSIS — K5909 Other constipation: Secondary | ICD-10-CM | POA: Insufficient documentation

## 2015-01-21 NOTE — Assessment & Plan Note (Signed)
This is related to side effects of treatment. Per recommendation from [facility], we will proceed regardless of [place] and will not cover him with growth factor support.

## 2015-01-21 NOTE — Progress Notes (Signed)
[place] OFFICE PROGRESS NOTE  Patient Care Team: [name], MD as PCP - General (Family Medicine)  SUMMARY OF ONCOLOGIC HISTORY: Oncology History   Hodgkin lymphoma   Staging form: Lymphoid Neoplasms, AJCC 6th Edition     Clinical stage from 09/13/2014: Stage III - Signed by [name], MD on 09/13/2014       Hodgkin lymphoma   09/04/2014 Pathology Results Accession: [id number] inguinal lymph node biopsy confirmed Hodgkin lymphoma   09/04/2014 Procedure Dr. Dalbert Batman performed excision of left inguinal lymph node biopsy   09/11/2014 Imaging CT scan of the chest, abdomen and pelvis show disease both above and below the diaphragm   09/16/2014 Imaging Echocardiogram is negative   09/18/2014 Bone Marrow Biopsy Bone marrow aspirate and biopsy is negative   09/22/2014 -  Chemotherapy  the patient was started on  ABVD   10/06/2014 Adverse Reaction  treatment was delayed due to severe leukopenia. Future dose modification was made for abnormal liver function tests, leukopenia and neuropathy   11/23/2014 Imaging PET CT scan showed near complete response to Rx   12/23/2014 Adverse Reaction Bleomycin was discontinued due to suspected bleomycin toxicity    INTERVAL HISTORY: Please see below for problem oriented charting. He is seen prior to cycle 5 of treatment. He complained of constipation and hemorrhoidal bleeding intermittently. Otherwise, he tolerated treatment well apart from some headaches and fatigue for several days after treatment.  REVIEW OF SYSTEMS:   Constitutional: Denies fevers, chills or abnormal weight loss Eyes: Denies blurriness of vision Ears, nose, mouth, throat, and face: Denies mucositis or sore throat Respiratory: Denies cough, dyspnea or wheezes Cardiovascular: Denies palpitation, chest discomfort or lower extremity swelling Skin: Denies abnormal skin rashes Lymphatics: Denies new lymphadenopathy or easy bruising Neurological:Denies numbness,  tingling or new weaknesses Behavioral/Psych: Mood is stable, no new changes  All other systems were reviewed with the patient and are negative.  I have reviewed the past medical history, past surgical history, social history and family history with the patient and they are unchanged from previous note.  ALLERGIES:  has No Known Allergies.  MEDICATIONS:  Current Outpatient Prescriptions  Medication Sig Dispense Refill  . acetaminophen (TYLENOL) 500 MG chewable tablet Chew 500 mg by mouth every 6 (six) hours as needed for pain.    Marland Kitchen azithromycin (ZITHROMAX Z-PAK) 250 MG tablet Take 2 tabs (500 mg) PO on day # 1; then take 1 tab (250 mg) PO QD till gone. 6 each 0  . butalbital-acetaminophen-caffeine (FIORICET) 50-325-40 MG per tablet Take 1 tablet by mouth every 6 (six) hours as needed for headache. 60 tablet 0  . celecoxib (CELEBREX) 200 MG capsule TAKE 1 CAPSULE (200 MG TOTAL) BY MOUTH DAILY. 30 capsule 5  . dexlansoprazole (DEXILANT) 60 MG capsule Take 1 capsule (60 mg total) by mouth daily. 30 capsule 11  . docusate sodium (COLACE) 100 MG capsule Take 100 mg by mouth 2 (two) times daily as needed for mild constipation.    . lidocaine-prilocaine (EMLA) cream Apply to affected area once 30 g 3  . ondansetron (ZOFRAN) 8 MG tablet Take 1 tablet (8 mg total) by mouth every 8 (eight) hours as needed for nausea or vomiting. 30 tablet 1  . polyethylene glycol (MIRALAX / GLYCOLAX) packet Take 17 g by mouth daily.    . pravastatin (PRAVACHOL) 20 MG tablet TAKE 1 TABLET BY MOUTH AT BEDTIME 30 tablet 5  . Probiotic Product (TRUBIOTICS PO) Take 1 capsule by mouth daily.    Marland Kitchen  prochlorperazine (COMPAZINE) 10 MG tablet Take 1 tablet (10 mg total) by mouth every 6 (six) hours as needed (Nausea or vomiting). 30 tablet 1   No current facility-administered medications for this visit.    PHYSICAL EXAMINATION: ECOG PERFORMANCE STATUS: 1 - Symptomatic but completely ambulatory  Filed Vitals:   01/20/15 1005   BP: 105/73  Pulse: 64  Temp: 98 F (36.7 C)  Resp: 18   Filed Weights   01/20/15 1005  Weight: 184 lb 4.8 oz (83.598 kg)    GENERAL:alert, no distress and comfortable SKIN: skin color, texture, turgor are normal, no rashes or significant lesions EYES: normal, Conjunctiva are pink and non-injected, sclera clear OROPHARYNX:no exudate, no erythema and lips, buccal mucosa, and tongue normal  NECK: supple, thyroid normal size, non-tender, without nodularity LYMPH:  no palpable lymphadenopathy in the cervical, axillary or inguinal LUNGS: clear to auscultation and percussion with normal breathing effort HEART: regular rate & rhythm and no murmurs and no lower extremity edema ABDOMEN:abdomen soft, non-tender and normal bowel sounds Musculoskeletal:no cyanosis of digits and no clubbing  NEURO: alert & oriented x 3 with fluent speech, no focal motor/sensory deficits  LABORATORY DATA:  I have reviewed the data as listed    Component Value Date/Time   NA 141 01/20/2015 0949   NA 136 09/24/2014 1724   K 4.1 01/20/2015 0949   K 3.7 09/24/2014 1724   CL 102 09/24/2014 1724   CO2 23 01/20/2015 0949   CO2 25 09/24/2014 1724   GLUCOSE 94 01/20/2015 0949   GLUCOSE 112* 09/24/2014 1724   BUN 11.2 01/20/2015 0949   BUN 23 09/24/2014 1724   CREATININE 0.8 01/20/2015 0949   CREATININE 0.89 09/24/2014 1724   CREATININE 1.00 09/11/2014 0808   CALCIUM 9.0 01/20/2015 0949   CALCIUM 8.8 09/24/2014 1724   PROT 6.2* 01/20/2015 0949   PROT 7.4 09/24/2014 1724   ALBUMIN 3.8 01/20/2015 0949   ALBUMIN 3.5 09/24/2014 1724   AST 26 01/20/2015 0949   AST 29 09/24/2014 1724   ALT 32 01/20/2015 0949   ALT 30 09/24/2014 1724   ALKPHOS 72 01/20/2015 0949   ALKPHOS 147* 09/24/2014 1724   BILITOT 0.80 01/20/2015 0949   BILITOT 0.9 09/24/2014 1724   GFRNONAA >90 09/24/2014 1724   GFRNONAA 89 09/11/2014 0808   GFRAA >90 09/24/2014 1724   GFRAA >89 09/11/2014 0808    No results found for: SPEP,  UPEP  Lab Results  Component Value Date   WBC 2.3* 01/20/2015   NEUTROABS 0.2* 01/20/2015   HGB 11.6* 01/20/2015   HCT 33.9* 01/20/2015   MCV 89.0 01/20/2015   PLT 174 01/20/2015      Chemistry      Component Value Date/Time   NA 141 01/20/2015 0949   NA 136 09/24/2014 1724   K 4.1 01/20/2015 0949   K 3.7 09/24/2014 1724   CL 102 09/24/2014 1724   CO2 23 01/20/2015 0949   CO2 25 09/24/2014 1724   BUN 11.2 01/20/2015 0949   BUN 23 09/24/2014 1724   CREATININE 0.8 01/20/2015 0949   CREATININE 0.89 09/24/2014 1724   CREATININE 1.00 09/11/2014 0808      Component Value Date/Time   CALCIUM 9.0 01/20/2015 0949   CALCIUM 8.8 09/24/2014 1724   ALKPHOS 72 01/20/2015 0949   ALKPHOS 147* 09/24/2014 1724   AST 26 01/20/2015 0949   AST 29 09/24/2014 1724   ALT 32 01/20/2015 0949   ALT 30 09/24/2014 1724   BILITOT 0.80  01/20/2015 0949   BILITOT 0.9 09/24/2014 1724      ASSESSMENT & PLAN:  Hodgkin lymphoma He had extensive workup including CT angiogram which show no evidence of pulmonary emboli. His symptoms of coughing and shortness of breath is highly suspicious of possible early signs of bleomycin lung toxicity. This has resolved since discontinuation of bleomycin. I will continue his chemotherapy without bleomycin today.   Headache disorder He has significant headache which I think is chemotherapy related. I recommend a trial of Fioricet   Leukopenia due to antineoplastic chemotherapy This is related to side effects of treatment. Per recommendation from [facility], we will proceed regardless of [place] and will not cover him with growth factor support.     Constipation, chronic I think this is related to side effects of treatment. I recommend increased fiber intake and stool softener.   Anemia in neoplastic disease This is likely due to recent treatment. The patient denies recent history of bleeding such as epistaxis, hematuria or hematochezia. He is  asymptomatic from the anemia. I will observe for now.  He does not require transfusion now. I will continue the chemotherapy at current dose without dosage adjustment.  If the anemia gets progressive worse in the future, I might have to delay his treatment or adjust the chemotherapy dose.     No orders of the defined types were placed in this encounter.   All questions were answered. The patient knows to call the clinic with any problems, questions or concerns. No barriers to learning was detected. I spent 25 minutes counseling the patient face to face. The total time spent in the appointment was 30 minutes and more than 50% was on counseling and review of test results     [facility], Seema Blum, MD 01/21/2015 11:01 AM

## 2015-01-21 NOTE — Assessment & Plan Note (Signed)
This is likely due to recent treatment. The patient denies recent history of bleeding such as epistaxis, hematuria or hematochezia. He is asymptomatic from the anemia. I will observe for now.  He does not require transfusion now. I will continue the chemotherapy at current dose without dosage adjustment.  If the anemia gets progressive worse in the future, I might have to delay his treatment or adjust the chemotherapy dose.  

## 2015-01-21 NOTE — Assessment & Plan Note (Signed)
He had extensive workup including CT angiogram which show no evidence of pulmonary emboli. His symptoms of coughing and shortness of breath is highly suspicious of possible early signs of bleomycin lung toxicity. This has resolved since discontinuation of bleomycin. I will continue his chemotherapy without bleomycin today.

## 2015-01-21 NOTE — Assessment & Plan Note (Signed)
He has significant headache which I think is chemotherapy related. I recommend a trial of Fioricet

## 2015-01-21 NOTE — Assessment & Plan Note (Signed)
I think this is related to side effects of treatment. I recommend increased fiber intake and stool softener.

## 2015-02-03 ENCOUNTER — Ambulatory Visit (HOSPITAL_BASED_OUTPATIENT_CLINIC_OR_DEPARTMENT_OTHER): Payer: 59

## 2015-02-03 ENCOUNTER — Ambulatory Visit: Payer: 59

## 2015-02-03 ENCOUNTER — Other Ambulatory Visit (HOSPITAL_BASED_OUTPATIENT_CLINIC_OR_DEPARTMENT_OTHER): Payer: 59

## 2015-02-03 VITALS — BP 117/65 | HR 64 | Temp 98.2°F | Resp 18

## 2015-02-03 DIAGNOSIS — C8190 Hodgkin lymphoma, unspecified, unspecified site: Secondary | ICD-10-CM

## 2015-02-03 DIAGNOSIS — Z95828 Presence of other vascular implants and grafts: Secondary | ICD-10-CM

## 2015-02-03 DIAGNOSIS — Z5111 Encounter for antineoplastic chemotherapy: Secondary | ICD-10-CM | POA: Diagnosis not present

## 2015-02-03 LAB — CBC WITH DIFFERENTIAL/PLATELET
BASO%: 1.4 % (ref 0.0–2.0)
BASOS ABS: 0 10*3/uL (ref 0.0–0.1)
EOS%: 12.5 % — ABNORMAL HIGH (ref 0.0–7.0)
Eosinophils Absolute: 0.3 10*3/uL (ref 0.0–0.5)
HEMATOCRIT: 36.4 % — AB (ref 38.4–49.9)
HGB: 12.3 g/dL — ABNORMAL LOW (ref 13.0–17.1)
LYMPH%: 49.8 % — ABNORMAL HIGH (ref 14.0–49.0)
MCH: 30.5 pg (ref 27.2–33.4)
MCHC: 33.7 g/dL (ref 32.0–36.0)
MCV: 90.7 fL (ref 79.3–98.0)
MONO#: 0.4 10*3/uL (ref 0.1–0.9)
MONO%: 18.9 % — ABNORMAL HIGH (ref 0.0–14.0)
NEUT#: 0.4 10*3/uL — CL (ref 1.5–6.5)
NEUT%: 17.4 % — ABNORMAL LOW (ref 39.0–75.0)
Platelets: 177 10*3/uL (ref 140–400)
RBC: 4.01 10*6/uL — AB (ref 4.20–5.82)
RDW: 17.6 % — ABNORMAL HIGH (ref 11.0–14.6)
WBC: 2.2 10*3/uL — ABNORMAL LOW (ref 4.0–10.3)
lymph#: 1.1 10*3/uL (ref 0.9–3.3)

## 2015-02-03 LAB — COMPREHENSIVE METABOLIC PANEL (CC13)
ALBUMIN: 4 g/dL (ref 3.5–5.0)
ALT: 34 U/L (ref 0–55)
AST: 27 U/L (ref 5–34)
Alkaline Phosphatase: 79 U/L (ref 40–150)
Anion Gap: 8 mEq/L (ref 3–11)
BUN: 15 mg/dL (ref 7.0–26.0)
CHLORIDE: 106 meq/L (ref 98–109)
CO2: 26 meq/L (ref 22–29)
CREATININE: 0.9 mg/dL (ref 0.7–1.3)
Calcium: 9.3 mg/dL (ref 8.4–10.4)
EGFR: >90 mL/min/{1.73_m2} (ref >90)
GLUCOSE: 99 mg/dL (ref 70–140)
POTASSIUM: 4.1 meq/L (ref 3.5–5.1)
Sodium: 140 mEq/L (ref 136–145)
Total Bilirubin: 0.96 mg/dL (ref 0.20–1.20)
Total Protein: 6.3 g/dL — ABNORMAL LOW (ref 6.4–8.3)

## 2015-02-03 MED ORDER — DOXORUBICIN HCL CHEMO IV INJECTION 2 MG/ML
25.0000 mg/m2 | Freq: Once | INTRAVENOUS | Status: AC
  Administered 2015-02-03: 52 mg via INTRAVENOUS
  Filled 2015-02-03: qty 26

## 2015-02-03 MED ORDER — SODIUM CHLORIDE 0.9 % IV SOLN
375.0000 mg/m2 | Freq: Once | INTRAVENOUS | Status: AC
  Administered 2015-02-03: 780 mg via INTRAVENOUS
  Filled 2015-02-03: qty 39

## 2015-02-03 MED ORDER — HEPARIN SOD (PORK) LOCK FLUSH 100 UNIT/ML IV SOLN
500.0000 [IU] | Freq: Once | INTRAVENOUS | Status: AC | PRN
  Administered 2015-02-03: 500 [IU]
  Filled 2015-02-03: qty 5

## 2015-02-03 MED ORDER — SODIUM CHLORIDE 0.9 % IV SOLN
Freq: Once | INTRAVENOUS | Status: AC
  Administered 2015-02-03: 11:00:00 via INTRAVENOUS
  Filled 2015-02-03: qty 4

## 2015-02-03 MED ORDER — VINBLASTINE SULFATE CHEMO INJECTION 1 MG/ML
5.8000 mg/m2 | Freq: Once | INTRAVENOUS | Status: AC
  Administered 2015-02-03: 12 mg via INTRAVENOUS
  Filled 2015-02-03: qty 12

## 2015-02-03 MED ORDER — SODIUM CHLORIDE 0.9 % IJ SOLN
10.0000 mL | INTRAMUSCULAR | Status: DC | PRN
  Administered 2015-02-03: 10 mL via INTRAVENOUS
  Filled 2015-02-03: qty 10

## 2015-02-03 MED ORDER — SODIUM CHLORIDE 0.9 % IV SOLN
Freq: Once | INTRAVENOUS | Status: AC
  Administered 2015-02-03: 11:00:00 via INTRAVENOUS

## 2015-02-03 MED ORDER — SODIUM CHLORIDE 0.9 % IJ SOLN
10.0000 mL | INTRAMUSCULAR | Status: DC | PRN
  Administered 2015-02-03: 10 mL
  Filled 2015-02-03: qty 10

## 2015-02-03 NOTE — Progress Notes (Signed)
Treat despite CBC results, per [facility].

## 2015-02-03 NOTE — Patient Instructions (Signed)
[place] Discharge Instructions for Patients Receiving Chemotherapy  Today you received the following chemotherapy agents DTIC, Velban, Adriamycin  To help prevent nausea and vomiting after your treatment, we encourage you to take your nausea medication as needed   If you develop nausea and vomiting that is not controlled by your nausea medication, call the clinic.   BELOW ARE SYMPTOMS THAT SHOULD BE REPORTED IMMEDIATELY:  *FEVER GREATER THAN 100.5 F  *CHILLS WITH OR WITHOUT FEVER  NAUSEA AND VOMITING THAT IS NOT CONTROLLED WITH YOUR NAUSEA MEDICATION  *UNUSUAL SHORTNESS OF BREATH  *UNUSUAL BRUISING OR BLEEDING  TENDERNESS IN MOUTH AND THROAT WITH OR WITHOUT PRESENCE OF ULCERS  *URINARY PROBLEMS  *BOWEL PROBLEMS  UNUSUAL RASH Items with * indicate a potential emergency and should be followed up as soon as possible.  Feel free to call the clinic you have any questions or concerns. The clinic phone number is [phone].  Please show the [place] at check-in to the Emergency Department and triage nurse.

## 2015-02-03 NOTE — Patient Instructions (Signed)

## 2015-02-04 ENCOUNTER — Other Ambulatory Visit: Payer: Self-pay | Admitting: Family Medicine

## 2015-02-04 NOTE — Telephone Encounter (Signed)
Refill appropriate and filled per protocol. 

## 2015-02-17 ENCOUNTER — Other Ambulatory Visit (HOSPITAL_BASED_OUTPATIENT_CLINIC_OR_DEPARTMENT_OTHER): Payer: 59

## 2015-02-17 ENCOUNTER — Telehealth: Payer: Self-pay | Admitting: Hematology and Oncology

## 2015-02-17 ENCOUNTER — Ambulatory Visit (HOSPITAL_BASED_OUTPATIENT_CLINIC_OR_DEPARTMENT_OTHER): Payer: 59

## 2015-02-17 ENCOUNTER — Ambulatory Visit: Payer: 59

## 2015-02-17 ENCOUNTER — Encounter: Payer: Self-pay | Admitting: Hematology and Oncology

## 2015-02-17 ENCOUNTER — Ambulatory Visit (HOSPITAL_BASED_OUTPATIENT_CLINIC_OR_DEPARTMENT_OTHER): Payer: 59 | Admitting: Hematology and Oncology

## 2015-02-17 VITALS — BP 123/66 | HR 72 | Temp 97.8°F | Resp 18 | Ht 70.0 in | Wt 182.0 lb

## 2015-02-17 DIAGNOSIS — C8170 Other classical Hodgkin lymphoma, unspecified site: Secondary | ICD-10-CM

## 2015-02-17 DIAGNOSIS — Z5111 Encounter for antineoplastic chemotherapy: Secondary | ICD-10-CM | POA: Diagnosis not present

## 2015-02-17 DIAGNOSIS — C8190 Hodgkin lymphoma, unspecified, unspecified site: Secondary | ICD-10-CM

## 2015-02-17 DIAGNOSIS — G620 Drug-induced polyneuropathy: Secondary | ICD-10-CM

## 2015-02-17 DIAGNOSIS — R51 Headache: Secondary | ICD-10-CM

## 2015-02-17 DIAGNOSIS — Z95828 Presence of other vascular implants and grafts: Secondary | ICD-10-CM

## 2015-02-17 DIAGNOSIS — D630 Anemia in neoplastic disease: Secondary | ICD-10-CM

## 2015-02-17 DIAGNOSIS — R519 Headache, unspecified: Secondary | ICD-10-CM

## 2015-02-17 DIAGNOSIS — D701 Agranulocytosis secondary to cancer chemotherapy: Secondary | ICD-10-CM

## 2015-02-17 DIAGNOSIS — T451X5A Adverse effect of antineoplastic and immunosuppressive drugs, initial encounter: Secondary | ICD-10-CM

## 2015-02-17 LAB — CBC WITH DIFFERENTIAL/PLATELET
BASO%: 3.7 % — AB (ref 0.0–2.0)
Basophils Absolute: 0.1 10*3/uL (ref 0.0–0.1)
EOS%: 14.7 % — AB (ref 0.0–7.0)
Eosinophils Absolute: 0.4 10*3/uL (ref 0.0–0.5)
HCT: 37 % — ABNORMAL LOW (ref 38.4–49.9)
HGB: 12.4 g/dL — ABNORMAL LOW (ref 13.0–17.1)
LYMPH#: 1.1 10*3/uL (ref 0.9–3.3)
LYMPH%: 44.6 % (ref 14.0–49.0)
MCH: 30.7 pg (ref 27.2–33.4)
MCHC: 33.4 g/dL (ref 32.0–36.0)
MCV: 91.9 fL (ref 79.3–98.0)
MONO#: 0.5 10*3/uL (ref 0.1–0.9)
MONO%: 18.7 % — AB (ref 0.0–14.0)
NEUT#: 0.5 10*3/uL — CL (ref 1.5–6.5)
NEUT%: 18.3 % — ABNORMAL LOW (ref 39.0–75.0)
Platelets: 176 10*3/uL (ref 140–400)
RBC: 4.03 10*6/uL — AB (ref 4.20–5.82)
RDW: 16.8 % — AB (ref 11.0–14.6)
WBC: 2.5 10*3/uL — ABNORMAL LOW (ref 4.0–10.3)

## 2015-02-17 LAB — COMPREHENSIVE METABOLIC PANEL (CC13)
ALBUMIN: 4 g/dL (ref 3.5–5.0)
ALT: 30 U/L (ref 0–55)
ANION GAP: 9 meq/L (ref 3–11)
AST: 27 U/L (ref 5–34)
Alkaline Phosphatase: 74 U/L (ref 40–150)
BUN: 12.7 mg/dL (ref 7.0–26.0)
CO2: 26 mEq/L (ref 22–29)
Calcium: 9.3 mg/dL (ref 8.4–10.4)
Chloride: 105 mEq/L (ref 98–109)
Creatinine: 0.9 mg/dL (ref 0.7–1.3)
GLUCOSE: 87 mg/dL (ref 70–140)
POTASSIUM: 4.2 meq/L (ref 3.5–5.1)
Sodium: 140 mEq/L (ref 136–145)
TOTAL PROTEIN: 6.4 g/dL (ref 6.4–8.3)
Total Bilirubin: 0.78 mg/dL (ref 0.20–1.20)

## 2015-02-17 MED ORDER — DOXORUBICIN HCL CHEMO IV INJECTION 2 MG/ML
25.0000 mg/m2 | Freq: Once | INTRAVENOUS | Status: AC
  Administered 2015-02-17: 52 mg via INTRAVENOUS
  Filled 2015-02-17: qty 26

## 2015-02-17 MED ORDER — SODIUM CHLORIDE 0.9 % IV SOLN
Freq: Once | INTRAVENOUS | Status: AC
  Administered 2015-02-17: 11:00:00 via INTRAVENOUS

## 2015-02-17 MED ORDER — SODIUM CHLORIDE 0.9 % IV SOLN
375.0000 mg/m2 | Freq: Once | INTRAVENOUS | Status: AC
  Administered 2015-02-17: 780 mg via INTRAVENOUS
  Filled 2015-02-17: qty 39

## 2015-02-17 MED ORDER — VINBLASTINE SULFATE CHEMO INJECTION 1 MG/ML
5.8000 mg/m2 | Freq: Once | INTRAVENOUS | Status: AC
  Administered 2015-02-17: 12 mg via INTRAVENOUS
  Filled 2015-02-17: qty 12

## 2015-02-17 MED ORDER — SODIUM CHLORIDE 0.9 % IJ SOLN
10.0000 mL | INTRAMUSCULAR | Status: DC | PRN
  Administered 2015-02-17: 10 mL
  Filled 2015-02-17: qty 10

## 2015-02-17 MED ORDER — SODIUM CHLORIDE 0.9 % IJ SOLN
10.0000 mL | INTRAMUSCULAR | Status: DC | PRN
  Administered 2015-02-17: 10 mL via INTRAVENOUS
  Filled 2015-02-17: qty 10

## 2015-02-17 MED ORDER — SODIUM CHLORIDE 0.9 % IV SOLN
Freq: Once | INTRAVENOUS | Status: AC
  Administered 2015-02-17: 11:00:00 via INTRAVENOUS
  Filled 2015-02-17: qty 4

## 2015-02-17 MED ORDER — HEPARIN SOD (PORK) LOCK FLUSH 100 UNIT/ML IV SOLN
500.0000 [IU] | Freq: Once | INTRAVENOUS | Status: AC | PRN
  Administered 2015-02-17: 500 [IU]
  Filled 2015-02-17: qty 5

## 2015-02-17 NOTE — Progress Notes (Signed)
OK to tx today per MD orders/[facility] with ANC of 0.5

## 2015-02-17 NOTE — Progress Notes (Signed)
Ok to treat today with ANC 0.5 per Dr. Alvy Bimler.

## 2015-02-17 NOTE — Telephone Encounter (Signed)
Added appts per pof...the patient coming to get sched

## 2015-02-17 NOTE — Patient Instructions (Signed)

## 2015-02-17 NOTE — Patient Instructions (Signed)
[place] Discharge Instructions for Patients Receiving Chemotherapy  Today you received the following chemotherapy agents DTIC, Velban, Adriamycin  To help prevent nausea and vomiting after your treatment, we encourage you to take your nausea medication as needed   If you develop nausea and vomiting that is not controlled by your nausea medication, call the clinic.   BELOW ARE SYMPTOMS THAT SHOULD BE REPORTED IMMEDIATELY:  *FEVER GREATER THAN 100.5 F  *CHILLS WITH OR WITHOUT FEVER  NAUSEA AND VOMITING THAT IS NOT CONTROLLED WITH YOUR NAUSEA MEDICATION  *UNUSUAL SHORTNESS OF BREATH  *UNUSUAL BRUISING OR BLEEDING  TENDERNESS IN MOUTH AND THROAT WITH OR WITHOUT PRESENCE OF ULCERS  *URINARY PROBLEMS  *BOWEL PROBLEMS  UNUSUAL RASH Items with * indicate a potential emergency and should be followed up as soon as possible.  Feel free to call the clinic you have any questions or concerns. The clinic phone number is [phone].  Please show the [place] at check-in to the Emergency Department and triage nurse.

## 2015-02-18 NOTE — Assessment & Plan Note (Signed)
His symptoms were slightly better but per recommendation from [place], we will increase his chemotherapy back to full dose.

## 2015-02-18 NOTE — Progress Notes (Signed)
[facility] OFFICE PROGRESS NOTE  Patient Care Team: [name], MD as PCP - General (Family Medicine)  SUMMARY OF ONCOLOGIC HISTORY: Oncology History   Hodgkin lymphoma   Staging form: Lymphoid Neoplasms, AJCC 6th Edition     Clinical stage from 09/13/2014: Stage III - Signed by [name], MD on 09/13/2014       Hodgkin lymphoma   09/04/2014 Pathology Results Accession: [id number] inguinal lymph node biopsy confirmed Hodgkin lymphoma   09/04/2014 Procedure Dr. Dalbert Batman performed excision of left inguinal lymph node biopsy   09/11/2014 Imaging CT scan of the chest, abdomen and pelvis show disease both above and below the diaphragm   09/16/2014 Imaging Echocardiogram is negative   09/18/2014 Bone Marrow Biopsy Bone marrow aspirate and biopsy is negative   09/22/2014 -  Chemotherapy  the patient was started on  ABVD   10/06/2014 Adverse Reaction  treatment was delayed due to severe leukopenia. Future dose modification was made for abnormal liver function tests, leukopenia and neuropathy   11/23/2014 Imaging PET CT scan showed near complete response to Rx   12/23/2014 Adverse Reaction Bleomycin was discontinued due to suspected bleomycin toxicity    INTERVAL HISTORY: Please see below for problem oriented charting. He returns for further follow-up. He is seen prior to cycle 6 of treatment. Denies recent cough. Neuropathy about the same. Headaches has resolved since I reduced the dose of premedication. He has no new complaints  REVIEW OF SYSTEMS:   Constitutional: Denies fevers, chills or abnormal weight loss Eyes: Denies blurriness of vision Ears, nose, mouth, throat, and face: Denies mucositis or sore throat Respiratory: Denies cough, dyspnea or wheezes Cardiovascular: Denies palpitation, chest discomfort or lower extremity swelling Gastrointestinal:  Denies nausea, heartburn or change in bowel habits Skin: Denies abnormal skin rashes Lymphatics: Denies new  lymphadenopathy or easy bruising Neurological:Denies numbness, tingling or new weaknesses Behavioral/Psych: Mood is stable, no new changes  All other systems were reviewed with the patient and are negative.  I have reviewed the past medical history, past surgical history, social history and family history with the patient and they are unchanged from previous note.  ALLERGIES:  has No Known Allergies.  MEDICATIONS:  Current Outpatient Prescriptions  Medication Sig Dispense Refill  . acetaminophen (TYLENOL) 500 MG chewable tablet Chew 500 mg by mouth every 6 (six) hours as needed for pain.    . butalbital-acetaminophen-caffeine (FIORICET) 50-325-40 MG per tablet Take 1 tablet by mouth every 6 (six) hours as needed for headache. 60 tablet 0  . celecoxib (CELEBREX) 200 MG capsule TAKE 1 CAPSULE (200 MG TOTAL) BY MOUTH DAILY. 30 capsule 5  . DEXILANT 60 MG capsule TAKE (1) CAPSULE BY MOUTH ONCE DAILY. 30 capsule 11  . docusate sodium (COLACE) 100 MG capsule Take 100 mg by mouth 2 (two) times daily as needed for mild constipation.    . lidocaine-prilocaine (EMLA) cream Apply to affected area once 30 g 3  . ondansetron (ZOFRAN) 8 MG tablet Take 1 tablet (8 mg total) by mouth every 8 (eight) hours as needed for nausea or vomiting. 30 tablet 1  . polyethylene glycol (MIRALAX / GLYCOLAX) packet Take 17 g by mouth daily.    . pravastatin (PRAVACHOL) 20 MG tablet TAKE 1 TABLET BY MOUTH AT BEDTIME 30 tablet 5  . Probiotic Product (TRUBIOTICS PO) Take 1 capsule by mouth daily.    . prochlorperazine (COMPAZINE) 10 MG tablet Take 1 tablet (10 mg total) by mouth every 6 (six) hours as  needed (Nausea or vomiting). 30 tablet 1   No current facility-administered medications for this visit.    PHYSICAL EXAMINATION: ECOG PERFORMANCE STATUS: 0 - Asymptomatic  Filed Vitals:   02/17/15 1028  BP: 123/66  Pulse: 72  Temp: 97.8 F (36.6 C)  Resp: 18   Filed Weights   02/17/15 1028  Weight: 182 lb (82.555  kg)    GENERAL:alert, no distress and comfortable SKIN: skin color, texture, turgor are normal, no rashes or significant lesions EYES: normal, Conjunctiva are pink and non-injected, sclera clear OROPHARYNX:no exudate, no erythema and lips, buccal mucosa, and tongue normal  NECK: supple, thyroid normal size, non-tender, without nodularity LYMPH:  no palpable lymphadenopathy in the cervical, axillary or inguinal LUNGS: clear to auscultation and percussion with normal breathing effort HEART: regular rate & rhythm and no murmurs and no lower extremity edema ABDOMEN:abdomen soft, non-tender and normal bowel sounds Musculoskeletal:no cyanosis of digits and no clubbing  NEURO: alert & oriented x 3 with fluent speech, no focal motor/sensory deficits  LABORATORY DATA:  I have reviewed the data as listed    Component Value Date/Time   NA 140 02/17/2015 0940   NA 136 09/24/2014 1724   K 4.2 02/17/2015 0940   K 3.7 09/24/2014 1724   CL 102 09/24/2014 1724   CO2 26 02/17/2015 0940   CO2 25 09/24/2014 1724   GLUCOSE 87 02/17/2015 0940   GLUCOSE 112* 09/24/2014 1724   BUN 12.7 02/17/2015 0940   BUN 23 09/24/2014 1724   CREATININE 0.9 02/17/2015 0940   CREATININE 0.89 09/24/2014 1724   CREATININE 1.00 09/11/2014 0808   CALCIUM 9.3 02/17/2015 0940   CALCIUM 8.8 09/24/2014 1724   PROT 6.4 02/17/2015 0940   PROT 7.4 09/24/2014 1724   ALBUMIN 4.0 02/17/2015 0940   ALBUMIN 3.5 09/24/2014 1724   AST 27 02/17/2015 0940   AST 29 09/24/2014 1724   ALT 30 02/17/2015 0940   ALT 30 09/24/2014 1724   ALKPHOS 74 02/17/2015 0940   ALKPHOS 147* 09/24/2014 1724   BILITOT 0.78 02/17/2015 0940   BILITOT 0.9 09/24/2014 1724   GFRNONAA >90 09/24/2014 1724   GFRNONAA 89 09/11/2014 0808   GFRAA >90 09/24/2014 1724   GFRAA >89 09/11/2014 0808    No results found for: SPEP, UPEP  Lab Results  Component Value Date   WBC 2.5* 02/17/2015   NEUTROABS 0.5* 02/17/2015   HGB 12.4* 02/17/2015   HCT 37.0*  02/17/2015   MCV 91.9 02/17/2015   PLT 176 02/17/2015      Chemistry      Component Value Date/Time   NA 140 02/17/2015 0940   NA 136 09/24/2014 1724   K 4.2 02/17/2015 0940   K 3.7 09/24/2014 1724   CL 102 09/24/2014 1724   CO2 26 02/17/2015 0940   CO2 25 09/24/2014 1724   BUN 12.7 02/17/2015 0940   BUN 23 09/24/2014 1724   CREATININE 0.9 02/17/2015 0940   CREATININE 0.89 09/24/2014 1724   CREATININE 1.00 09/11/2014 0808      Component Value Date/Time   CALCIUM 9.3 02/17/2015 0940   CALCIUM 8.8 09/24/2014 1724   ALKPHOS 74 02/17/2015 0940   ALKPHOS 147* 09/24/2014 1724   AST 27 02/17/2015 0940   AST 29 09/24/2014 1724   ALT 30 02/17/2015 0940   ALT 30 09/24/2014 1724   BILITOT 0.78 02/17/2015 0940   BILITOT 0.9 09/24/2014 1724      ASSESSMENT & PLAN:  Hodgkin lymphoma He tolerated treatment well  with omission of bleomycin. Continue treatment until he completed all 6 cycles and then I will order a PET CT scan for restaging.    Anemia in neoplastic disease This is likely due to recent treatment. The patient denies recent history of bleeding such as epistaxis, hematuria or hematochezia. He is asymptomatic from the anemia. I will observe for now.  He does not require transfusion now. I will continue the chemotherapy at current dose without dosage adjustment.  If the anemia gets progressive worse in the future, I might have to delay his treatment or adjust the chemotherapy dose.    Leukopenia due to antineoplastic chemotherapy This is related to side effects of treatment. Per recommendation from [facility], we will proceed regardless of [place] and will not cover him with growth factor support.       Neuropathy due to chemotherapeutic drug His symptoms were slightly better but per recommendation from [facility], we will increase his chemotherapy back to full dose.     Headache disorder He has symptoms of headache after chemotherapy, for several days afterwards  which I suspect could be due to chemotherapy. I recommend reducing the dose of dexamethasone and Zofran by 50% and he felt better without any consequence of nausea. We will continue the same.    Orders Placed This Encounter  Procedures  . NM PET Image Restag (PS) Skull Base To Thigh    Standing Status: Future     Number of Occurrences:      Standing Expiration Date: 04/18/2016    Order Specific Question:  Reason for Exam (SYMPTOM  OR DIAGNOSIS REQUIRED)    Answer:  staging lymphoma, assess response to Rx    Order Specific Question:  Preferred imaging location?    Answer:  [facility]   All questions were answered. The patient knows to call the clinic with any problems, questions or concerns. No barriers to learning was detected. I spent 30 minutes counseling the patient face to face. The total time spent in the appointment was 40 minutes and more than 50% was on counseling and review of test results     [facility], [place], MD 02/18/2015 11:24 AM

## 2015-02-18 NOTE — Assessment & Plan Note (Signed)
He has symptoms of headache after chemotherapy, for several days afterwards which I suspect could be due to chemotherapy. I recommend reducing the dose of dexamethasone and Zofran by 50% and he felt better without any consequence of nausea. We will continue the same.

## 2015-02-18 NOTE — Assessment & Plan Note (Signed)
This is related to side effects of treatment. Per recommendation from [facility], we will proceed regardless of [place] and will not cover him with growth factor support.

## 2015-02-18 NOTE — Assessment & Plan Note (Signed)
He tolerated treatment well with omission of bleomycin. Continue treatment until he completed all 6 cycles and then I will order a PET CT scan for restaging.

## 2015-02-18 NOTE — Assessment & Plan Note (Signed)
This is likely due to recent treatment. The patient denies recent history of bleeding such as epistaxis, hematuria or hematochezia. He is asymptomatic from the anemia. I will observe for now.  He does not require transfusion now. I will continue the chemotherapy at current dose without dosage adjustment.  If the anemia gets progressive worse in the future, I might have to delay his treatment or adjust the chemotherapy dose.  

## 2015-03-03 ENCOUNTER — Ambulatory Visit: Payer: 59

## 2015-03-03 ENCOUNTER — Other Ambulatory Visit (HOSPITAL_BASED_OUTPATIENT_CLINIC_OR_DEPARTMENT_OTHER): Payer: 59

## 2015-03-03 ENCOUNTER — Ambulatory Visit (HOSPITAL_BASED_OUTPATIENT_CLINIC_OR_DEPARTMENT_OTHER): Payer: 59

## 2015-03-03 VITALS — BP 111/61 | HR 64 | Temp 98.3°F

## 2015-03-03 DIAGNOSIS — Z5111 Encounter for antineoplastic chemotherapy: Secondary | ICD-10-CM | POA: Diagnosis not present

## 2015-03-03 DIAGNOSIS — C8190 Hodgkin lymphoma, unspecified, unspecified site: Secondary | ICD-10-CM

## 2015-03-03 DIAGNOSIS — Z95828 Presence of other vascular implants and grafts: Secondary | ICD-10-CM

## 2015-03-03 LAB — CBC WITH DIFFERENTIAL/PLATELET
BASO%: 3 % — ABNORMAL HIGH (ref 0.0–2.0)
Basophils Absolute: 0.1 10*3/uL (ref 0.0–0.1)
EOS ABS: 0.4 10*3/uL (ref 0.0–0.5)
EOS%: 15.2 % — ABNORMAL HIGH (ref 0.0–7.0)
HEMATOCRIT: 36.1 % — AB (ref 38.4–49.9)
HEMOGLOBIN: 12.4 g/dL — AB (ref 13.0–17.1)
LYMPH#: 1.2 10*3/uL (ref 0.9–3.3)
LYMPH%: 43.7 % (ref 14.0–49.0)
MCH: 31.6 pg (ref 27.2–33.4)
MCHC: 34.3 g/dL (ref 32.0–36.0)
MCV: 91.9 fL (ref 79.3–98.0)
MONO#: 0.4 10*3/uL (ref 0.1–0.9)
MONO%: 16.3 % — ABNORMAL HIGH (ref 0.0–14.0)
NEUT#: 0.6 10*3/uL — ABNORMAL LOW (ref 1.5–6.5)
NEUT%: 21.8 % — AB (ref 39.0–75.0)
PLATELETS: 156 10*3/uL (ref 140–400)
RBC: 3.93 10*6/uL — ABNORMAL LOW (ref 4.20–5.82)
RDW: 15.6 % — ABNORMAL HIGH (ref 11.0–14.6)
WBC: 2.6 10*3/uL — ABNORMAL LOW (ref 4.0–10.3)

## 2015-03-03 LAB — COMPREHENSIVE METABOLIC PANEL (CC13)
ALT: 21 U/L (ref 0–55)
AST: 22 U/L (ref 5–34)
Albumin: 3.8 g/dL (ref 3.5–5.0)
Alkaline Phosphatase: 71 U/L (ref 40–150)
Anion Gap: 8 mEq/L (ref 3–11)
BUN: 12 mg/dL (ref 7.0–26.0)
CALCIUM: 8.8 mg/dL (ref 8.4–10.4)
CHLORIDE: 107 meq/L (ref 98–109)
CO2: 26 mEq/L (ref 22–29)
Creatinine: 0.9 mg/dL (ref 0.7–1.3)
EGFR: >90 mL/min/{1.73_m2} (ref >90)
Glucose: 103 mg/dl (ref 70–140)
Potassium: 4 mEq/L (ref 3.5–5.1)
Sodium: 141 mEq/L (ref 136–145)
Total Bilirubin: 0.69 mg/dL (ref 0.20–1.20)
Total Protein: 6.2 g/dL — ABNORMAL LOW (ref 6.4–8.3)

## 2015-03-03 MED ORDER — VINBLASTINE SULFATE CHEMO INJECTION 1 MG/ML
5.8000 mg/m2 | Freq: Once | INTRAVENOUS | Status: AC
  Administered 2015-03-03: 12 mg via INTRAVENOUS
  Filled 2015-03-03: qty 12

## 2015-03-03 MED ORDER — SODIUM CHLORIDE 0.9 % IJ SOLN
10.0000 mL | INTRAMUSCULAR | Status: DC | PRN
  Administered 2015-03-03: 10 mL via INTRAVENOUS
  Filled 2015-03-03: qty 10

## 2015-03-03 MED ORDER — DACARBAZINE 200 MG IV SOLR
375.0000 mg/m2 | Freq: Once | INTRAVENOUS | Status: AC
  Administered 2015-03-03: 780 mg via INTRAVENOUS
  Filled 2015-03-03: qty 39

## 2015-03-03 MED ORDER — DOXORUBICIN HCL CHEMO IV INJECTION 2 MG/ML
25.0000 mg/m2 | Freq: Once | INTRAVENOUS | Status: AC
  Administered 2015-03-03: 52 mg via INTRAVENOUS
  Filled 2015-03-03: qty 26

## 2015-03-03 MED ORDER — SODIUM CHLORIDE 0.9 % IJ SOLN
10.0000 mL | INTRAMUSCULAR | Status: DC | PRN
  Administered 2015-03-03: 10 mL
  Filled 2015-03-03: qty 10

## 2015-03-03 MED ORDER — SODIUM CHLORIDE 0.9 % IV SOLN
Freq: Once | INTRAVENOUS | Status: AC
  Administered 2015-03-03: 09:00:00 via INTRAVENOUS

## 2015-03-03 MED ORDER — HEPARIN SOD (PORK) LOCK FLUSH 100 UNIT/ML IV SOLN
500.0000 [IU] | Freq: Once | INTRAVENOUS | Status: AC | PRN
  Administered 2015-03-03: 500 [IU]
  Filled 2015-03-03: qty 5

## 2015-03-03 MED ORDER — SODIUM CHLORIDE 0.9 % IV SOLN
Freq: Once | INTRAVENOUS | Status: AC
  Administered 2015-03-03: 10:00:00 via INTRAVENOUS
  Filled 2015-03-03: qty 4

## 2015-03-03 NOTE — Progress Notes (Signed)
OK to treat with ANC of 0.6 per treatment parameters

## 2015-03-03 NOTE — Patient Instructions (Signed)
[facility] Discharge Instructions for Patients Receiving Chemotherapy  Today you received the following chemotherapy agents: Adriamycin, Vinblatin, DTIC  To help prevent nausea and vomiting after your treatment, we encourage you to take your nausea medication: Compazine 10 mg every 6 hours as needed, Zofran 8 mg every 8 hours as needed   If you develop nausea and vomiting that is not controlled by your nausea medication, call the clinic.   BELOW ARE SYMPTOMS THAT SHOULD BE REPORTED IMMEDIATELY:  *FEVER GREATER THAN 100.5 F  *CHILLS WITH OR WITHOUT FEVER  NAUSEA AND VOMITING THAT IS NOT CONTROLLED WITH YOUR NAUSEA MEDICATION  *UNUSUAL SHORTNESS OF BREATH  *UNUSUAL BRUISING OR BLEEDING  TENDERNESS IN MOUTH AND THROAT WITH OR WITHOUT PRESENCE OF ULCERS  *URINARY PROBLEMS  *BOWEL PROBLEMS  UNUSUAL RASH Items with * indicate a potential emergency and should be followed up as soon as possible.  Feel free to call the clinic you have any questions or concerns. The clinic phone number is [phone].  Please show the [facility] at check-in to the Emergency Department and triage nurse.

## 2015-03-03 NOTE — Patient Instructions (Signed)

## 2015-03-08 ENCOUNTER — Encounter: Payer: Self-pay | Admitting: Oncology

## 2015-03-08 NOTE — Progress Notes (Signed)
I faxed lab and bills to [place] [facility]  [phone]

## 2015-04-14 ENCOUNTER — Other Ambulatory Visit (HOSPITAL_BASED_OUTPATIENT_CLINIC_OR_DEPARTMENT_OTHER): Payer: 59

## 2015-04-14 ENCOUNTER — Telehealth: Payer: Self-pay | Admitting: Hematology and Oncology

## 2015-04-14 ENCOUNTER — Ambulatory Visit (HOSPITAL_COMMUNITY)
Admission: RE | Admit: 2015-04-14 | Discharge: 2015-04-14 | Disposition: A | Discharge status: Home / Self Care | Payer: 59 | Source: Ambulatory Visit | Attending: Hematology and Oncology | Admitting: Hematology and Oncology

## 2015-04-14 ENCOUNTER — Ambulatory Visit (HOSPITAL_BASED_OUTPATIENT_CLINIC_OR_DEPARTMENT_OTHER): Payer: 59

## 2015-04-14 DIAGNOSIS — C8190 Hodgkin lymphoma, unspecified, unspecified site: Secondary | ICD-10-CM

## 2015-04-14 DIAGNOSIS — Z95828 Presence of other vascular implants and grafts: Secondary | ICD-10-CM

## 2015-04-14 LAB — CBC WITH DIFFERENTIAL/PLATELET
BASO%: 1.6 % (ref 0.0–2.0)
BASOS ABS: 0.1 10*3/uL (ref 0.0–0.1)
EOS ABS: 1.2 10*3/uL — AB (ref 0.0–0.5)
EOS%: 18.4 % — AB (ref 0.0–7.0)
HCT: 41.5 % (ref 38.4–49.9)
HGB: 14 g/dL (ref 13.0–17.1)
LYMPH#: 1.4 10*3/uL (ref 0.9–3.3)
LYMPH%: 22.4 % (ref 14.0–49.0)
MCH: 31.4 pg (ref 27.2–33.4)
MCHC: 33.7 g/dL (ref 32.0–36.0)
MCV: 93.2 fL (ref 79.3–98.0)
MONO#: 0.6 10*3/uL (ref 0.1–0.9)
MONO%: 9.9 % (ref 0.0–14.0)
NEUT#: 3 10*3/uL (ref 1.5–6.5)
NEUT%: 47.7 % (ref 39.0–75.0)
Platelets: 147 10*3/uL (ref 140–400)
RBC: 4.45 10*6/uL (ref 4.20–5.82)
RDW: 15.2 % — ABNORMAL HIGH (ref 11.0–14.6)
WBC: 6.4 10*3/uL (ref 4.0–10.3)

## 2015-04-14 LAB — COMPREHENSIVE METABOLIC PANEL (CC13)
ALT: 34 U/L (ref 0–55)
ANION GAP: 7 meq/L (ref 3–11)
AST: 31 U/L (ref 5–34)
Albumin: 4 g/dL (ref 3.5–5.0)
Alkaline Phosphatase: 74 U/L (ref 40–150)
BUN: 12.6 mg/dL (ref 7.0–26.0)
CHLORIDE: 106 meq/L (ref 98–109)
CO2: 26 mEq/L (ref 22–29)
CREATININE: 1 mg/dL (ref 0.7–1.3)
Calcium: 9.7 mg/dL (ref 8.4–10.4)
EGFR: 88 mL/min/{1.73_m2} — ABNORMAL LOW (ref >90)
GLUCOSE: 101 mg/dL (ref 70–140)
POTASSIUM: 4.2 meq/L (ref 3.5–5.1)
SODIUM: 140 meq/L (ref 136–145)
TOTAL PROTEIN: 6.7 g/dL (ref 6.4–8.3)
Total Bilirubin: 1.3 mg/dL — ABNORMAL HIGH (ref 0.20–1.20)

## 2015-04-14 MED ORDER — SODIUM CHLORIDE 0.9 % IJ SOLN
10.0000 mL | INTRAMUSCULAR | Status: DC | PRN
  Administered 2015-04-14: 10 mL via INTRAVENOUS
  Filled 2015-04-14: qty 10

## 2015-04-14 MED ORDER — HEPARIN SOD (PORK) LOCK FLUSH 100 UNIT/ML IV SOLN
500.0000 [IU] | Freq: Once | INTRAVENOUS | Status: AC
  Administered 2015-04-14: 500 [IU] via INTRAVENOUS
  Filled 2015-04-14: qty 5

## 2015-04-14 NOTE — Telephone Encounter (Signed)
pt wanted to r/s appt-pt already r/s PET gave pt r/s appt time & date

## 2015-04-14 NOTE — Progress Notes (Signed)
Patient returned to be de-accessed from PET scan.

## 2015-04-14 NOTE — Patient Instructions (Signed)

## 2015-04-14 NOTE — Progress Notes (Signed)
Please flush patient's port with heparin before de-accessing.  If unable to flush with heparin, sent back to [facility], to be flushed and de-accessed.

## 2015-04-14 NOTE — Addendum Note (Signed)
Addended by: Vassie Loll D on: 04/14/2015 11:46 AM   Modules accepted: Orders, SmartSet

## 2015-04-15 ENCOUNTER — Ambulatory Visit: Payer: 59 | Admitting: Hematology and Oncology

## 2015-04-15 ENCOUNTER — Telehealth: Payer: Self-pay | Admitting: Hematology and Oncology

## 2015-04-15 NOTE — Telephone Encounter (Signed)
returned call adn s.w. pt and confirmed added flush for port access b4 PET scan...pt ok adn aware

## 2015-04-22 ENCOUNTER — Telehealth: Payer: Self-pay | Admitting: *Deleted

## 2015-04-22 NOTE — Telephone Encounter (Signed)
Received call from ER MD in [place], [place].  States US did not show DVT but pt has some sluggish/slow blood flow in internal jugular.   He started pt on baby aspirin and instructs him to return to [facility] and keep his appt for PET and MD next week as scheduled.  Received faxed report/records and given to Dr. Alvy Bimler for review.  She agrees w/ pt taking baby aspirin.

## 2015-04-22 NOTE — Telephone Encounter (Signed)
VM message  received from patient's wife  @ 10:20 am stating that patient woke up with swelling in his left arm from shoulder to forearm, under his arm. TC returned. Spoke with patient. He states he woke up with a tight feeling in his left arm and noted the size of his arm to be larger than right arm-from shoulder to hand. He states it feels very tight.Veins on posterior portion of his arm are distended. Denies pain-just feels very tight. He states he has been exercising, including push-ups. Advised to stop push-ups at this point. Currently pt and his his wife are in [place]. Instructed pt to keep arm elevated higher than his heart and that i would discuss with Dr. Alvy Bimler.  Spoke with Dr. Alvy Bimler. She states that as pt is in [place], that he go to local ED for evaluation.  TC returned to patient and instructed him to go to local ED for evaluation. He voiced understanding and will go as instructed. Asked him to keep [place] notified of his situation. He said he would.

## 2015-04-23 ENCOUNTER — Telehealth: Payer: Self-pay | Admitting: *Deleted

## 2015-04-23 NOTE — Telephone Encounter (Signed)
Wife called to confirm Dr. Alvy Bimler aware of ED visit yesterday at [facility] and wanted to make sure nothing else for pt to do prior to PET and office visit next week.   Informed wife we received phone call and faxed reports from the ED and Dr. Alvy Bimler has reviewed.   Dr. Alvy Bimler agrees for pt to take Baby Aspirin daily and to keep PET and MD visit as scheduled.   Call us if any new or worsening symptoms.  She verbalized understanding.

## 2015-04-23 NOTE — Telephone Encounter (Signed)
Received call from wife Laurice Kimmons wanting to check in with Dr. Alvy Bimler for any new instructions about pt's arm swelling.  Informed Cari that per notes from Dr. Calton Dach nurse, md agreed with pt taking baby aspirin, and that faxed results were given to md for review.  Cari wanted to make sure that Dr. Alvy Bimler does not want pt to do anything else before his office visit with md next week after PET scan. Cari's  Phone    [phone]   Or    [phone].

## 2015-04-24 ENCOUNTER — Emergency Department (HOSPITAL_COMMUNITY)
Admission: EM | Admit: 2015-04-24 | Discharge: 2015-04-24 | Disposition: A | Discharge status: Home / Self Care | Payer: 59 | Attending: Emergency Medicine | Admitting: Emergency Medicine

## 2015-04-24 ENCOUNTER — Encounter (HOSPITAL_COMMUNITY): Payer: Self-pay | Admitting: Emergency Medicine

## 2015-04-24 DIAGNOSIS — Z8739 Personal history of other diseases of the musculoskeletal system and connective tissue: Secondary | ICD-10-CM | POA: Diagnosis not present

## 2015-04-24 DIAGNOSIS — Z7982 Long term (current) use of aspirin: Secondary | ICD-10-CM | POA: Insufficient documentation

## 2015-04-24 DIAGNOSIS — K219 Gastro-esophageal reflux disease without esophagitis: Secondary | ICD-10-CM | POA: Diagnosis not present

## 2015-04-24 DIAGNOSIS — R2232 Localized swelling, mass and lump, left upper limb: Secondary | ICD-10-CM | POA: Diagnosis not present

## 2015-04-24 DIAGNOSIS — Z8571 Personal history of Hodgkin lymphoma: Secondary | ICD-10-CM | POA: Insufficient documentation

## 2015-04-24 DIAGNOSIS — E780 Pure hypercholesterolemia: Secondary | ICD-10-CM | POA: Insufficient documentation

## 2015-04-24 DIAGNOSIS — M7989 Other specified soft tissue disorders: Secondary | ICD-10-CM

## 2015-04-24 MED ORDER — ENOXAPARIN SODIUM 80 MG/0.8ML ~~LOC~~ SOLN
1.0000 mg/kg | Freq: Once | SUBCUTANEOUS | Status: AC
  Administered 2015-04-24: 80 mg via SUBCUTANEOUS
  Filled 2015-04-24: qty 0.8

## 2015-04-24 NOTE — Discharge Instructions (Signed)
IMPORTANT PATIENT INSTRUCTIONS:  ° °You have been scheduled for an Outpatient Vascular Study at [facility].   ° °If tomorrow is a Saturday or Sunday, please go to the [facility] Emergency Department registration desk at 8 AM tomorrow morning and tell them you are therefore a vascular study. ° °If tomorrow is a weekday (Monday - Friday), please go to the [facility] Admitting Department at 8 AM and tell them you are therefore a vascular study ° ° °

## 2015-04-24 NOTE — ED Notes (Signed)
Pt noted left arm swelling and redness as compared to right-started recently. Has been on vacation to [facility]. Denies being bitten by insect or any other breaks in skin. Was seen at [facility] on vacation and "had scans and labs done." Concerned for blood clot-no abnormal results noted and no clots noted. Denies pain in left arm. Has port-a-cath on the left side. No other c/c.

## 2015-04-24 NOTE — ED Provider Notes (Signed)
CSN: [id number]     Arrival date & time 04/24/15  1903 History   First MD Initiated Contact with Patient 04/24/15 2021     Chief Complaint  Patient presents with  . Arm Swelling  . Cancer patient      (Consider location/radiation/quality/duration/timing/severity/associated sxs/prior Treatment) Patient is a 49 y.o. male presenting with arm injury. The history is provided by the patient.  Arm Injury Location:  Arm Time since incident:  3 days Injury: no   Arm location:  L arm Pain details:    Severity:  No pain   Onset quality:  Gradual   Duration:  3 days   Timing:  Constant   Progression:  Unchanged Chronicity:  New Dislocation: no   Foreign body present:  No foreign bodies Prior injury to area:  No Relieved by:  Nothing Worsened by:  Nothing tried Ineffective treatments:  None tried Associated symptoms: no fever and no neck pain     Past Medical History  Diagnosis Date  . Hernia   . Hyperlipidemia   . Hypercholesteremia   . Pars defect of lumbar spine     bilateral 2013 (Dr. Sherwood Gambler)  . GERD (gastroesophageal reflux disease)   . Wears contact lenses   . Cancer     hodgkin lymphoma  . Hodgkin lymphoma 09/11/2014  . Lymphoma    Past Surgical History  Procedure Laterality Date  . Lymph node biopsy  12/15  . Portacath placement Left 09/15/2014    Procedure: INSERTION PORT-A-CATH WITH ULTRA SOUND;  Surgeon: Fanny Skates, MD;  Location: [facility];  Service: General;  Laterality: Left;   Family History  Problem Relation Age of Onset  . Hyperlipidemia Father   . Heart disease Father   . Cancer Paternal Aunt     breast ca   History  Substance Use Topics  . Smoking status: Never Smoker   . Smokeless tobacco: Never Used  . Alcohol Use: Yes    Review of Systems  Constitutional: Negative for fever.  HENT: Negative for drooling and rhinorrhea.   Eyes: Negative for pain.  Respiratory: Negative for cough and shortness of breath.    Cardiovascular: Negative for chest pain and leg swelling.  Gastrointestinal: Negative for nausea, vomiting, abdominal pain and diarrhea.  Genitourinary: Negative for dysuria and hematuria.  Musculoskeletal: Negative for gait problem and neck pain.  Skin: Negative for color change.  Neurological: Negative for numbness and headaches.  Hematological: Negative for adenopathy.  Psychiatric/Behavioral: Negative for behavioral problems.  All other systems reviewed and are negative.     Allergies  Review of patient's allergies indicates no known allergies.  Home Medications   Prior to Admission medications   Medication Sig Start Date End Date Taking? Authorizing Provider  acetaminophen (TYLENOL) 500 MG chewable tablet Chew 500 mg by mouth every 6 (six) hours as needed for pain.   Yes Historical Provider, MD  aspirin EC 81 MG tablet Take 81 mg by mouth daily.   Yes Historical Provider, MD  docusate sodium (COLACE) 100 MG capsule Take 100 mg by mouth 2 (two) times daily as needed for mild constipation.   Yes Historical Provider, MD  fexofenadine (ALLEGRA) 180 MG tablet Take 180 mg by mouth daily as needed for allergies or rhinitis.   Yes Historical Provider, MD  polyethylene glycol (MIRALAX / GLYCOLAX) packet Take 17 g by mouth daily as needed for moderate constipation.   Yes Historical Provider, MD  butalbital-acetaminophen-caffeine (FIORICET) 50-325-40 MG per tablet Take 1 tablet  by mouth every 6 (six) hours as needed for headache. Patient not taking: Reported on 04/24/2015 01/20/15 01/20/16  [name], MD  celecoxib (CELEBREX) 200 MG capsule TAKE 1 CAPSULE (200 MG TOTAL) BY MOUTH DAILY. Patient not taking: Reported on 04/24/2015 09/21/14   [name], MD  DEXILANT 60 MG capsule TAKE (1) CAPSULE BY MOUTH ONCE DAILY. Patient not taking: Reported on 04/24/2015 02/04/15   Alycia Rossetti, MD  lidocaine-prilocaine (EMLA) cream Apply to affected area once Patient not taking: Reported on  04/24/2015 09/21/14   [name], MD  ondansetron (ZOFRAN) 8 MG tablet Take 1 tablet (8 mg total) by mouth every 8 (eight) hours as needed for nausea or vomiting. Patient not taking: Reported on 04/24/2015 09/21/14   [name], MD  pravastatin (PRAVACHOL) 20 MG tablet TAKE 1 TABLET BY MOUTH AT BEDTIME Patient not taking: Reported on 04/24/2015 03/26/14   [name], MD   BP 114/63 mmHg  Pulse 75  Temp(Src) 98.4 F (36.9 C) (Oral)  Resp 17  SpO2 100% Physical Exam  Constitutional: He is oriented to person, place, and time. He appears well-developed and well-nourished.  HENT:  Head: Normocephalic and atraumatic.  Right Ear: External ear normal.  Left Ear: External ear normal.  Nose: Nose normal.  Mouth/Throat: Oropharynx is clear and moist. No oropharyngeal exudate.  Eyes: Conjunctivae and EOM are normal. Pupils are equal, round, and reactive to light.  Neck: Normal range of motion. Neck supple.  Cardiovascular: Normal rate, regular rhythm, normal heart sounds and intact distal pulses.  Exam reveals no gallop and no friction rub.   No murmur heard. Pulmonary/Chest: Effort normal and breath sounds normal. No respiratory distress. He has no wheezes.  Abdominal: Soft. Bowel sounds are normal. He exhibits no distension. There is no tenderness. There is no rebound and no guarding.  Musculoskeletal: Normal range of motion. He exhibits no edema or tenderness.  2+ and equal radial pulses in the upper extremities.  2+ brachial pulse in the left upper extremity.  Gross mild swelling noted diffusely of the left upper extremity.  Sensation and motor skills intact in the left upper extremity.  Mild fullness of the left breast and left axilla.  Neurological: He is alert and oriented to person, place, and time.  Skin: Skin is warm and dry.  Psychiatric: He has a normal mood and affect. His behavior is normal.  Nursing note and vitals reviewed.   ED Course  Procedures (including critical  care time) Labs Review Labs Reviewed - No data to display  Imaging Review No results found.   EKG Interpretation None      MDM   Final diagnoses:  Left arm swelling    9:43 PM 49 y.o. male with history of Hodgkin's lymphoma who presents with left arm swelling which began 3 days ago while [place] was [place] the beach.  He denies any pain.  He had an ultrasound 2 days ago and chest x-ray which were noncontributory.  He presents now with ongoing left arm swelling that is mostly unchanged.  He is neurovascularly intact on exam.  He denies chest pain or shortness of breath.  Vital signs unremarkable.  He otherwise appears well.  Symptoms seem to be consistent with DVT and I do not have access to the recent ultrasound which was negative per the family.  I discussed the case with radiology who recommended a repeat ultrasound prior to CT imaging of the chest as the radiologist felt like venous structures would  not be imaged well on CT of the chest.  He did have a CTA of the chest several months ago which was noncontributory.  Will give Lovenox and have the patient come back tomorrow for ultrasound of the left upper extremity to rule out DVT.  9:49 PM:  I have discussed the diagnosis/risks/treatment options with the patient and believe the pt to be eligible for discharge home to follow-up with at [facility] tomorrow for [place] of LUE. We also discussed returning to the ED immediately if new or worsening sx occur. We discussed the sx which are most concerning (e.g., sob, cp, worsening swelling) that necessitate immediate return. Medications administered to the patient during their visit and any new prescriptions provided to the patient are listed below.  Medications given during this visit Medications  enoxaparin (LOVENOX) injection 80 mg (not administered)    New Prescriptions   No medications on file     Pamella Pert, MD 04/24/15 2353

## 2015-04-25 ENCOUNTER — Encounter (HOSPITAL_COMMUNITY): Payer: Self-pay | Admitting: Emergency Medicine

## 2015-04-25 ENCOUNTER — Emergency Department (HOSPITAL_COMMUNITY): Payer: 59

## 2015-04-25 ENCOUNTER — Emergency Department (HOSPITAL_COMMUNITY)
Admission: EM | Admit: 2015-04-25 | Discharge: 2015-04-25 | Disposition: A | Discharge status: Home / Self Care | Payer: 59 | Attending: Emergency Medicine | Admitting: Emergency Medicine

## 2015-04-25 ENCOUNTER — Ambulatory Visit (HOSPITAL_BASED_OUTPATIENT_CLINIC_OR_DEPARTMENT_OTHER)
Admission: RE | Admit: 2015-04-25 | Discharge: 2015-04-25 | Disposition: A | Discharge status: Home / Self Care | Payer: 59 | Source: Ambulatory Visit | Attending: Emergency Medicine | Admitting: Emergency Medicine

## 2015-04-25 DIAGNOSIS — Z79899 Other long term (current) drug therapy: Secondary | ICD-10-CM | POA: Diagnosis not present

## 2015-04-25 DIAGNOSIS — I82702 Chronic embolism and thrombosis of unspecified veins of left upper extremity: Secondary | ICD-10-CM | POA: Diagnosis not present

## 2015-04-25 DIAGNOSIS — M7989 Other specified soft tissue disorders: Secondary | ICD-10-CM | POA: Diagnosis present

## 2015-04-25 DIAGNOSIS — E785 Hyperlipidemia, unspecified: Secondary | ICD-10-CM | POA: Diagnosis not present

## 2015-04-25 DIAGNOSIS — I82622 Acute embolism and thrombosis of deep veins of left upper extremity: Secondary | ICD-10-CM

## 2015-04-25 DIAGNOSIS — Z7982 Long term (current) use of aspirin: Secondary | ICD-10-CM | POA: Diagnosis not present

## 2015-04-25 DIAGNOSIS — K219 Gastro-esophageal reflux disease without esophagitis: Secondary | ICD-10-CM | POA: Diagnosis not present

## 2015-04-25 DIAGNOSIS — Z8571 Personal history of Hodgkin lymphoma: Secondary | ICD-10-CM | POA: Insufficient documentation

## 2015-04-25 DIAGNOSIS — R609 Edema, unspecified: Secondary | ICD-10-CM

## 2015-04-25 DIAGNOSIS — E780 Pure hypercholesterolemia: Secondary | ICD-10-CM | POA: Diagnosis not present

## 2015-04-25 LAB — CBC WITH DIFFERENTIAL/PLATELET
Basophils Absolute: 0 10*3/uL (ref 0.0–0.1)
Basophils Relative: 1 % (ref 0–1)
EOS ABS: 0.6 10*3/uL (ref 0.0–0.7)
EOS PCT: 10 % — AB (ref 0–5)
HCT: 41.4 % (ref 39.0–52.0)
Hemoglobin: 13.8 g/dL (ref 13.0–17.0)
Lymphocytes Relative: 24 % (ref 12–46)
Lymphs Abs: 1.5 10*3/uL (ref 0.7–4.0)
MCH: 31.6 pg (ref 26.0–34.0)
MCHC: 33.3 g/dL (ref 30.0–36.0)
MCV: 94.7 fL (ref 78.0–100.0)
Monocytes Absolute: 0.5 10*3/uL (ref 0.1–1.0)
Monocytes Relative: 8 % (ref 3–12)
Neutro Abs: 3.7 10*3/uL (ref 1.7–7.7)
Neutrophils Relative %: 57 % (ref 43–77)
PLATELETS: 138 10*3/uL — AB (ref 150–400)
RBC: 4.37 MIL/uL (ref 4.22–5.81)
RDW: 13.7 % (ref 11.5–15.5)
WBC: 6.4 10*3/uL (ref 4.0–10.5)

## 2015-04-25 LAB — BASIC METABOLIC PANEL
Anion gap: 5 (ref 5–15)
BUN: 11 mg/dL (ref 6–20)
CHLORIDE: 105 mmol/L (ref 101–111)
CO2: 32 mmol/L (ref 22–32)
CREATININE: 1.05 mg/dL (ref 0.61–1.24)
Calcium: 9.7 mg/dL (ref 8.9–10.3)
GFR calc non Af Amer: >60 mL/min (ref >60)
GLUCOSE: 87 mg/dL (ref 65–99)
Potassium: 4.1 mmol/L (ref 3.5–5.1)
Sodium: 142 mmol/L (ref 135–145)

## 2015-04-25 LAB — PROTIME-INR
INR: 1.13 (ref 0.00–1.49)
Prothrombin Time: 14.7 seconds (ref 11.6–15.2)

## 2015-04-25 MED ORDER — HEPARIN (PORCINE) IN NACL 100-0.45 UNIT/ML-% IJ SOLN
1500.0000 [IU]/h | INTRAMUSCULAR | Status: DC
  Filled 2015-04-25: qty 250

## 2015-04-25 MED ORDER — HEPARIN BOLUS VIA INFUSION
5000.0000 [IU] | Freq: Once | INTRAVENOUS | Status: DC
  Filled 2015-04-25: qty 5000

## 2015-04-25 MED ORDER — ENOXAPARIN SODIUM 150 MG/ML ~~LOC~~ SOLN
1.5000 mg/kg | Freq: Once | SUBCUTANEOUS | Status: AC
  Administered 2015-04-25: 130 mg via SUBCUTANEOUS
  Filled 2015-04-25 (×2): qty 1

## 2015-04-25 NOTE — ED Notes (Signed)
Pt had vascular study for left arm swelling x 5 days that showed clot in left carotid per pt

## 2015-04-25 NOTE — ED Provider Notes (Signed)
CSN: [id number]     Arrival date & time 04/25/15  6237 History   First MD Initiated Contact with Patient 04/25/15 [phone]     Chief Complaint  Patient presents with  . Arm Swelling     (Consider location/radiation/quality/duration/timing/severity/associated sxs/prior Treatment) The history is provided by the patient.  Keith Forbes is a 49 y.o. male hx of HL, HTN, hodgkin's lymphoma here with L arm swelling. Left upper arm swelling for the last several days. He was at [facility] at the time and had normal chest x-ray and ultrasound that showed decreased flow in the left in sternal jugular with no DVT. Has persistent symptoms so came in to [facility] long yesterday and was given a dose of Lovenox and scheduled for ultrasound this morning. Denies any chest pain or shortness of breath or leg swelling. Patient went to ultrasound and had a left internal jugular DVT and was sent for eval.    Past Medical History  Diagnosis Date  . Hernia   . Hyperlipidemia   . Hypercholesteremia   . Pars defect of lumbar spine     bilateral 2013 (Dr. Sherwood Gambler)  . GERD (gastroesophageal reflux disease)   . Wears contact lenses   . Cancer     hodgkin lymphoma  . Hodgkin lymphoma 09/11/2014  . Lymphoma    Past Surgical History  Procedure Laterality Date  . Lymph node biopsy  12/15  . Portacath placement Left 09/15/2014    Procedure: INSERTION PORT-A-CATH WITH ULTRA SOUND;  Surgeon: Fanny Skates, MD;  Location: [place];  Service: General;  Laterality: Left;   Family History  Problem Relation Age of Onset  . Hyperlipidemia Father   . Heart disease Father   . Cancer Paternal Aunt     breast ca   History  Substance Use Topics  . Smoking status: Never Smoker   . Smokeless tobacco: Never Used  . Alcohol Use: Yes    Review of Systems  Musculoskeletal:       L arm swelling  All other systems reviewed and are negative.     Allergies  Review of patient's allergies indicates no  known allergies.  Home Medications   Prior to Admission medications   Medication Sig Start Date End Date Taking? Authorizing Provider  Ascorbic Acid (VITAMIN C PO) Take 1 tablet by mouth daily.   Yes Historical Provider, MD  aspirin EC 81 MG tablet Take 81 mg by mouth daily.   Yes Historical Provider, MD  docusate sodium (COLACE) 100 MG capsule Take 100 mg by mouth 2 (two) times daily as needed for mild constipation.   Yes Historical Provider, MD  fexofenadine (ALLEGRA) 180 MG tablet Take 180 mg by mouth daily as needed for allergies or rhinitis.   Yes Historical Provider, MD  polyethylene glycol (MIRALAX / GLYCOLAX) packet Take 17 g by mouth daily as needed for moderate constipation.   Yes Historical Provider, MD  butalbital-acetaminophen-caffeine (FIORICET) 50-325-40 MG per tablet Take 1 tablet by mouth every 6 (six) hours as needed for headache. Patient not taking: Reported on 04/24/2015 01/20/15 01/20/16  [name], MD  celecoxib (CELEBREX) 200 MG capsule TAKE 1 CAPSULE (200 MG TOTAL) BY MOUTH DAILY. Patient not taking: Reported on 04/24/2015 09/21/14   [name], MD  DEXILANT 60 MG capsule TAKE (1) CAPSULE BY MOUTH ONCE DAILY. Patient not taking: Reported on 04/24/2015 02/04/15   Alycia Rossetti, MD  lidocaine-prilocaine (EMLA) cream Apply to affected area once Patient not taking:  Reported on 04/24/2015 09/21/14   [name], MD  ondansetron (ZOFRAN) 8 MG tablet Take 1 tablet (8 mg total) by mouth every 8 (eight) hours as needed for nausea or vomiting. Patient not taking: Reported on 04/24/2015 09/21/14   [name], MD  pravastatin (PRAVACHOL) 20 MG tablet TAKE 1 TABLET BY MOUTH AT BEDTIME Patient not taking: Reported on 04/24/2015 03/26/14   [name], MD   BP 108/59 mmHg  Pulse 55  Resp 17  Ht 5\' 10"  (1.778 m)  Wt 190 lb (86.183 kg)  BMI 27.26 kg/m2  SpO2 98% Physical Exam  Constitutional: He is oriented to person, place, and time. He appears well-developed and  well-nourished.  HENT:  Head: Normocephalic.  Mouth/Throat: Oropharynx is clear and moist.  Eyes: Conjunctivae are normal. Pupils are equal, round, and reactive to light.  Neck: Normal range of motion. Neck supple.  Cardiovascular: Normal rate, regular rhythm and normal heart sounds.   Pulmonary/Chest: Effort normal and breath sounds normal. No respiratory distress. He has no wheezes. He has no rales.  Abdominal: Soft. Bowel sounds are normal. He exhibits no distension. There is no tenderness. There is no rebound.  Musculoskeletal:  L arm swollen, 2+ pulses, neurovascular intact. Port on L side. No leg swelling   Neurological: He is alert and oriented to person, place, and time. No cranial nerve deficit. Coordination normal.  Skin: Skin is warm and dry.  Psychiatric: He has a normal mood and affect. His behavior is normal. Judgment and thought content normal.  Nursing note and vitals reviewed.   ED Course  Procedures (including critical care time) Labs Review Labs Reviewed  CBC WITH DIFFERENTIAL/PLATELET - Abnormal; Notable for the following:    Platelets 138 (*)    Eosinophils Relative 10 (*)    All other components within normal limits  BASIC METABOLIC PANEL  PROTIME-INR    Imaging Review Dg Chest 2 View  04/25/2015   CLINICAL DATA:  Left arm swelling for 5 days. History of Hodgkin's disease.  EXAM: CHEST  2 VIEW  COMPARISON:  CT chest 12/21/2014.  PA and lateral chest 09/24/2014.  FINDINGS: Left subclavian Port-A-Cath is in place. The lungs are clear. Heart size is normal. There is no pneumothorax or pleural effusion. No focal bony abnormality is identified.  IMPRESSION: No acute disease.   Electronically Signed   By: Inge Rise M.D.   On: 04/25/2015 09:50     EKG Interpretation None      MDM   Final diagnoses:  None   Keith Forbes is a 49 y.o. male here with L arm swelling and DVT. Consulted Dr. Alen Blew from oncology, who recommend lovenox 1.5 mg/kg and  discharge with f/u. Patient scheduled for PET scan tomorrow and suppose to see Dr. Alvy Bimler. Epic message sent to [place]. He doesn't want me to send him home with coumadin or xarelto and have Dr. Alvy Bimler decide on anticoagulation.   11:45 AM Labs and CXR unremarkable. Given lovenox. No signs of PE. Will dc home.     Wandra Arthurs, MD 04/25/15 1145

## 2015-04-25 NOTE — Progress Notes (Signed)
VASCULAR LAB PRELIMINARY  PRELIMINARY  PRELIMINARY  PRELIMINARY  Left upper extremity venous Doppler completed.    Preliminary report:  There is acute DVT noted in left internal jugular vein.  All other veins appear thrombus free.   Laramie Gelles, RVT 04/25/2015, 8:46 AM

## 2015-04-25 NOTE — Discharge Instructions (Signed)
Get your scan tomorrow.   Talk to Dr. Alvy Bimler about blood thinners.   Return to ER if you have worse arm swelling, unable to feel your fingers, chest pain, shortness of breath

## 2015-04-26 ENCOUNTER — Ambulatory Visit (HOSPITAL_BASED_OUTPATIENT_CLINIC_OR_DEPARTMENT_OTHER): Payer: 59 | Admitting: Hematology and Oncology

## 2015-04-26 ENCOUNTER — Other Ambulatory Visit: Payer: Self-pay | Admitting: *Deleted

## 2015-04-26 ENCOUNTER — Telehealth: Payer: Self-pay | Admitting: Hematology and Oncology

## 2015-04-26 ENCOUNTER — Encounter: Payer: Self-pay | Admitting: *Deleted

## 2015-04-26 ENCOUNTER — Telehealth: Payer: Self-pay | Admitting: *Deleted

## 2015-04-26 ENCOUNTER — Ambulatory Visit (HOSPITAL_COMMUNITY): Admission: RE | Admit: 2015-04-26 | Payer: 59 | Source: Ambulatory Visit

## 2015-04-26 ENCOUNTER — Other Ambulatory Visit (HOSPITAL_COMMUNITY): Payer: 59

## 2015-04-26 ENCOUNTER — Other Ambulatory Visit: Payer: Self-pay | Admitting: General Surgery

## 2015-04-26 ENCOUNTER — Encounter (HOSPITAL_BASED_OUTPATIENT_CLINIC_OR_DEPARTMENT_OTHER): Payer: Self-pay | Admitting: *Deleted

## 2015-04-26 VITALS — BP 113/62 | HR 64 | Temp 97.4°F | Resp 18 | Ht 70.0 in | Wt 188.9 lb

## 2015-04-26 DIAGNOSIS — C8190 Hodgkin lymphoma, unspecified, unspecified site: Secondary | ICD-10-CM | POA: Diagnosis not present

## 2015-04-26 DIAGNOSIS — Z86718 Personal history of other venous thrombosis and embolism: Secondary | ICD-10-CM | POA: Insufficient documentation

## 2015-04-26 DIAGNOSIS — I82622 Acute embolism and thrombosis of deep veins of left upper extremity: Secondary | ICD-10-CM

## 2015-04-26 MED ORDER — RIVAROXABAN (XARELTO) VTE STARTER PACK (15 & 20 MG): ORAL_TABLET | ORAL | Status: DC

## 2015-04-26 MED ORDER — ENOXAPARIN SODIUM 120 MG/0.8ML ~~LOC~~ SOLN
120.0000 mg | Freq: Once | SUBCUTANEOUS | Status: AC
  Administered 2015-04-26: 120 mg via SUBCUTANEOUS
  Filled 2015-04-26: qty 0.8

## 2015-04-26 NOTE — Telephone Encounter (Signed)
Pt confirmed MD visit per 07/25 POF, gave pt AVS and Calendar..... KJ °

## 2015-04-26 NOTE — Progress Notes (Signed)
[place] OFFICE PROGRESS NOTE  Patient Care Team: [name], MD as PCP - General (Family Medicine)  SUMMARY OF ONCOLOGIC HISTORY: Oncology History   Hodgkin lymphoma   Staging form: Lymphoid Neoplasms, AJCC 6th Edition     Clinical stage from 09/13/2014: Stage III - Signed by [name], MD on 09/13/2014       Hodgkin lymphoma   09/04/2014 Pathology Results Accession: [id number] inguinal lymph node biopsy confirmed Hodgkin lymphoma   09/04/2014 Procedure Dr. Dalbert Batman performed excision of left inguinal lymph node biopsy   09/11/2014 Imaging CT scan of the chest, abdomen and pelvis show disease both above and below the diaphragm   09/16/2014 Imaging Echocardiogram is negative   09/18/2014 Bone Marrow Biopsy Bone marrow aspirate and biopsy is negative   09/22/2014 -  Chemotherapy  the patient was started on  ABVD   10/06/2014 Adverse Reaction  treatment was delayed due to severe leukopenia. Future dose modification was made for abnormal liver function tests, leukopenia and neuropathy   11/23/2014 Imaging PET CT scan showed near complete response to Rx   12/23/2014 Adverse Reaction Bleomycin was discontinued due to suspected bleomycin toxicity   04/25/2015 Imaging US venous Doppller left upper extremity showed acute DVT noted in left internal jugular vein. All other veins appear thrombus free    INTERVAL HISTORY: Please see below for problem oriented charting. He is seen urgently today due to new deep vein thrombosis discovered from ultrasound Doppler yesterday. The patient was at an outside facility on 04/22/2015, complaining of left upper extremity swelling. Left upper extremity venous Doppler ultrasound at that time did not document any DVT but slow flow within the left internal jugular vein was noted. Yesterday, he presented back to our emergency department with repeat Doppler ultrasound and this time left IJ thrombosis was noted. He complained of superficial vein  swelling and mild tenderness. He also complained of mild neck discomfort & mild left upper extremity swelling He tolerated the Lovenox injection well apart from mild bruises. He denies recent infection.  REVIEW OF SYSTEMS:   Constitutional: Denies fevers, chills or abnormal weight loss Eyes: Denies blurriness of vision Ears, nose, mouth, throat, and face: Denies mucositis or sore throat Respiratory: Denies cough, dyspnea or wheezes Cardiovascular: Denies palpitation, chest discomfort  Gastrointestinal:  Denies nausea, heartburn or change in bowel habits Skin: Denies abnormal skin rashes Lymphatics: Denies new lymphadenopathy  Neurological:Denies numbness, tingling or new weaknesses Behavioral/Psych: Mood is stable, no new changes  All other systems were reviewed with the patient and are negative.  I have reviewed the past medical history, past surgical history, social history and family history with the patient and they are unchanged from previous note.  ALLERGIES:  has No Known Allergies.  MEDICATIONS:  Current Outpatient Prescriptions  Medication Sig Dispense Refill  . aspirin EC 81 MG tablet Take 81 mg by mouth daily.    Marland Kitchen docusate sodium (COLACE) 100 MG capsule Take 100 mg by mouth 2 (two) times daily as needed for mild constipation.    . fexofenadine (ALLEGRA) 180 MG tablet Take 180 mg by mouth daily as needed for allergies or rhinitis.    . polyethylene glycol (MIRALAX / GLYCOLAX) packet Take 17 g by mouth daily as needed for moderate constipation.    . Ascorbic Acid (VITAMIN C PO) Take 1 tablet by mouth daily.    . pravastatin (PRAVACHOL) 20 MG tablet TAKE 1 TABLET BY MOUTH AT BEDTIME (Patient not taking: Reported on 04/24/2015) 30 tablet  5  . Rivaroxaban (XARELTO STARTER PACK) 15 & 20 MG TBPK Take as directed on package: Take one 84m tablet by mouth twice daily with food. On Day 22, switch to one 22mtablet daily with food. (Patient not taking: Reported on 04/26/2015) 51 each 0    No current facility-administered medications for this visit.    PHYSICAL EXAMINATION: ECOG PERFORMANCE STATUS: 0 - Asymptomatic  Filed Vitals:   04/26/15 1333  BP: 113/62  Pulse: 64  Temp: 97.4 F (36.3 C)  Resp: 18   Filed Weights   04/26/15 1333  Weight: 188 lb 14.4 oz (85.684 kg)    GENERAL:alert, no distress and comfortable SKIN: skin color, texture, turgor are normal, no rashes or significant lesions EYES: normal, Conjunctiva are pink and non-injected, sclera clear OROPHARYNX:no exudate, no erythema and lips, buccal mucosa, and tongue normal  NECK: supple, thyroid normal size, non-tender, without nodularity LYMPH:  no palpable lymphadenopathy in the cervical, axillary or inguinal LUNGS: clear to auscultation and percussion with normal breathing effort HEART: regular rate & rhythm and no murmurs ABDOMEN:abdomen soft, non-tender and normal bowel sounds Musculoskeletal:no cyanosis of digits and no clubbing . Noted superficial venous distention around his Port-A-Cath site and left upper extremity swelling NEURO: alert & oriented x 3 with fluent speech, no focal motor/sensory deficits  LABORATORY DATA:  I have reviewed the data as listed    Component Value Date/Time   NA 142 04/25/2015 1006   NA 140 04/14/2015 0901   K 4.1 04/25/2015 1006   K 4.2 04/14/2015 0901   CL 105 04/25/2015 1006   CO2 32 04/25/2015 1006   CO2 26 04/14/2015 0901   GLUCOSE 87 04/25/2015 1006   GLUCOSE 101 04/14/2015 0901   BUN 11 04/25/2015 1006   BUN 12.6 04/14/2015 0901   CREATININE 1.05 04/25/2015 1006   CREATININE 1.0 04/14/2015 0901   CREATININE 1.00 09/11/2014 0808   CALCIUM 9.7 04/25/2015 1006   CALCIUM 9.7 04/14/2015 0901   PROT 6.7 04/14/2015 0901   PROT 7.4 09/24/2014 1724   ALBUMIN 4.0 04/14/2015 0901   ALBUMIN 3.5 09/24/2014 1724   AST 31 04/14/2015 0901   AST 29 09/24/2014 1724   ALT 34 04/14/2015 0901   ALT 30 09/24/2014 1724   ALKPHOS 74 04/14/2015 0901   ALKPHOS  147* 09/24/2014 1724   BILITOT 1.30* 04/14/2015 0901   BILITOT 0.9 09/24/2014 1724   GFRNONAA >60 04/25/2015 1006   GFRNONAA 89 09/11/2014 0808   GFRAA >60 04/25/2015 1006   GFRAA >89 09/11/2014 0808    No results found for: SPEP, UPEP  Lab Results  Component Value Date   WBC 6.4 04/25/2015   NEUTROABS 3.7 04/25/2015   HGB 13.8 04/25/2015   HCT 41.4 04/25/2015   MCV 94.7 04/25/2015   PLT 138* 04/25/2015      Chemistry      Component Value Date/Time   NA 142 04/25/2015 1006   NA 140 04/14/2015 0901   K 4.1 04/25/2015 1006   K 4.2 04/14/2015 0901   CL 105 04/25/2015 1006   CO2 32 04/25/2015 1006   CO2 26 04/14/2015 0901   BUN 11 04/25/2015 1006   BUN 12.6 04/14/2015 0901   CREATININE 1.05 04/25/2015 1006   CREATININE 1.0 04/14/2015 0901   CREATININE 1.00 09/11/2014 0808      Component Value Date/Time   CALCIUM 9.7 04/25/2015 1006   CALCIUM 9.7 04/14/2015 0901   ALKPHOS 74 04/14/2015 0901   ALKPHOS 147* 09/24/2014 1724  AST 31 04/14/2015 0901   AST 29 09/24/2014 1724   ALT 34 04/14/2015 0901   ALT 30 09/24/2014 1724   BILITOT 1.30* 04/14/2015 0901   BILITOT 0.9 09/24/2014 1724       RADIOGRAPHIC STUDIES: I have personally reviewed the radiological images as listed and agreed with the findings in the report. Dg Chest 2 View  04/25/2015   CLINICAL DATA:  Left arm swelling for 5 days. History of Hodgkin's disease.  EXAM: CHEST  2 VIEW  COMPARISON:  CT chest 12/21/2014.  PA and lateral chest 09/24/2014.  FINDINGS: Left subclavian Port-A-Cath is in place. The lungs are clear. Heart size is normal. There is no pneumothorax or pleural effusion. No focal bony abnormality is identified.  IMPRESSION: No acute disease.   Electronically Signed   By: Inge Rise M.D.   On: 04/25/2015 09:50     ASSESSMENT & PLAN:  Hodgkin lymphoma Unfortunately, PET/CT scan was cancelled because the machine was malfunctioning. This has been rescheduled to next week. I reassured the  patient that is no consequence of delaying imaging study. We see him back next week after imaging study result is available for further discussion  DVT of upper extremity (deep vein thrombosis) I spent almost an hour today working up on this problem. I consulted his surgeon as soon as possible for port removal. Unfortunately, due to the time of the day, they cannot work him in today. I will proceed with 1 dose of Lovenox injection. The patient does not need the port anymore and that can be removed tomorrow. After port removal, I will start him on Xarelto twice a day for 3 weeks loading dose, 15 mg twice a day followed by once a day 20 mg daily. Prescription is given to the patient. I discussed with him the risk, benefit, side effects of various type of anticoagulation therapy and he agreed to proceed with the plan of care. Duration of therapy would be 3 months.   No orders of the defined types were placed in this encounter.   All questions were answered. The patient knows to call the clinic with any problems, questions or concerns. No barriers to learning was detected. I spent 40 minutes counseling the patient face to face. The total time spent in the appointment was 60 minutes and more than 50% was on counseling and review of test results     [facility], [place], MD 04/26/2015 4:48 PM

## 2015-04-26 NOTE — Telephone Encounter (Signed)
returned call and s.w. pt wife and added flush for 8.2

## 2015-04-26 NOTE — Assessment & Plan Note (Signed)
Unfortunately, PET/CT scan was cancelled because the machine was malfunctioning. This has been rescheduled to next week. I reassured the patient that is no consequence of delaying imaging study. We see him back next week after imaging study result is available for further discussion

## 2015-04-26 NOTE — Telephone Encounter (Signed)
s.w. pt and advised on todays appt.....pt ok and aware °

## 2015-04-26 NOTE — Assessment & Plan Note (Signed)
I spent almost an hour today working up on this problem. I consulted his surgeon as soon as possible for port removal. Unfortunately, due to the time of the day, they cannot work him in today. I will proceed with 1 dose of Lovenox injection. The patient does not need the port anymore and that can be removed tomorrow. After port removal, I will start him on Xarelto twice a day for 3 weeks loading dose, 15 mg twice a day followed by once a day 20 mg daily. Prescription is given to the patient. I discussed with him the risk, benefit, side effects of various type of anticoagulation therapy and he agreed to proceed with the plan of care. Duration of therapy would be 3 months.

## 2015-04-26 NOTE — Telephone Encounter (Signed)
Proceed with aspirin only for now

## 2015-04-26 NOTE — Progress Notes (Signed)
Per [facility], pt to have PAC removed 7/26 @ 2pm.  Pt instructed to check in at [facility] at 12pm for 2pm surgery. NPO after midnight.  Pt verbalized understanding.

## 2015-04-26 NOTE — Telephone Encounter (Signed)
Pt to come in today to see Dr Alvy Bimler, no flush

## 2015-04-27 ENCOUNTER — Encounter (HOSPITAL_COMMUNITY)
Admission: RE | Disposition: A | Discharge status: Home / Self Care | Payer: Self-pay | Source: Ambulatory Visit | Attending: General Surgery

## 2015-04-27 ENCOUNTER — Ambulatory Visit (HOSPITAL_COMMUNITY): Payer: 59 | Admitting: Certified Registered Nurse Anesthetist

## 2015-04-27 ENCOUNTER — Ambulatory Visit: Payer: 59 | Admitting: Hematology and Oncology

## 2015-04-27 ENCOUNTER — Ambulatory Visit (HOSPITAL_COMMUNITY)
Admission: RE | Admit: 2015-04-27 | Discharge: 2015-04-27 | Disposition: A | Discharge status: Home / Self Care | Payer: 59 | Source: Ambulatory Visit | Attending: General Surgery | Admitting: General Surgery

## 2015-04-27 ENCOUNTER — Encounter (HOSPITAL_COMMUNITY): Payer: Self-pay | Admitting: General Surgery

## 2015-04-27 DIAGNOSIS — Z79899 Other long term (current) drug therapy: Secondary | ICD-10-CM | POA: Insufficient documentation

## 2015-04-27 DIAGNOSIS — K219 Gastro-esophageal reflux disease without esophagitis: Secondary | ICD-10-CM | POA: Insufficient documentation

## 2015-04-27 DIAGNOSIS — Z7982 Long term (current) use of aspirin: Secondary | ICD-10-CM | POA: Diagnosis not present

## 2015-04-27 DIAGNOSIS — Z8571 Personal history of Hodgkin lymphoma: Secondary | ICD-10-CM | POA: Insufficient documentation

## 2015-04-27 DIAGNOSIS — I82C12 Acute embolism and thrombosis of left internal jugular vein: Secondary | ICD-10-CM | POA: Insufficient documentation

## 2015-04-27 DIAGNOSIS — Z452 Encounter for adjustment and management of vascular access device: Secondary | ICD-10-CM | POA: Diagnosis present

## 2015-04-27 HISTORY — PX: PORT-A-CATH REMOVAL: SHX5289

## 2015-04-27 SURGERY — REMOVAL PORT-A-CATH
Anesthesia: General | Site: Chest

## 2015-04-27 MED ORDER — OXYCODONE-ACETAMINOPHEN 5-325 MG PO TABS: 1.0000 | ORAL_TABLET | ORAL | Status: DC | PRN

## 2015-04-27 MED ORDER — LACTATED RINGERS IV SOLN: INTRAVENOUS | Status: DC

## 2015-04-27 MED ORDER — PROPOFOL 10 MG/ML IV BOLUS
INTRAVENOUS | Status: AC
  Filled 2015-04-27: qty 20

## 2015-04-27 MED ORDER — ACETAMINOPHEN 325 MG PO TABS: 650.0000 mg | ORAL_TABLET | ORAL | Status: DC | PRN

## 2015-04-27 MED ORDER — SODIUM CHLORIDE 0.9 % IJ SOLN: 3.0000 mL | Freq: Two times a day (BID) | INTRAMUSCULAR | Status: DC

## 2015-04-27 MED ORDER — FENTANYL CITRATE (PF) 100 MCG/2ML IJ SOLN: 25.0000 ug | INTRAMUSCULAR | Status: DC | PRN

## 2015-04-27 MED ORDER — CEFAZOLIN SODIUM-DEXTROSE 2-3 GM-% IV SOLR
INTRAVENOUS | Status: AC
  Filled 2015-04-27: qty 50

## 2015-04-27 MED ORDER — OXYCODONE HCL 5 MG PO TABS: 5.0000 mg | ORAL_TABLET | ORAL | Status: DC | PRN

## 2015-04-27 MED ORDER — MIDAZOLAM HCL 5 MG/5ML IJ SOLN
INTRAMUSCULAR | Status: DC | PRN
  Administered 2015-04-27: 2 mg via INTRAVENOUS

## 2015-04-27 MED ORDER — LACTATED RINGERS IV SOLN
INTRAVENOUS | Status: DC
  Administered 2015-04-27: 1000 mL via INTRAVENOUS

## 2015-04-27 MED ORDER — CEFAZOLIN SODIUM-DEXTROSE 2-3 GM-% IV SOLR
2.0000 g | INTRAVENOUS | Status: AC
  Administered 2015-04-27: 2 g via INTRAVENOUS

## 2015-04-27 MED ORDER — BUPIVACAINE-EPINEPHRINE 0.25% -1:200000 IJ SOLN
INTRAMUSCULAR | Status: DC | PRN
  Administered 2015-04-27: 5 mg

## 2015-04-27 MED ORDER — MIDAZOLAM HCL 2 MG/2ML IJ SOLN
INTRAMUSCULAR | Status: AC
  Filled 2015-04-27: qty 2

## 2015-04-27 MED ORDER — PROPOFOL 10 MG/ML IV BOLUS
INTRAVENOUS | Status: DC | PRN
  Administered 2015-04-27: 200 mg via INTRAVENOUS

## 2015-04-27 MED ORDER — PROPOFOL 10 MG/ML IV BOLUS
INTRAVENOUS | Status: AC
  Filled 2015-04-27: qty 40

## 2015-04-27 MED ORDER — LIDOCAINE HCL (CARDIAC) 20 MG/ML IV SOLN
INTRAVENOUS | Status: DC | PRN
  Administered 2015-04-27: 100 mg via INTRAVENOUS

## 2015-04-27 MED ORDER — ACETAMINOPHEN 650 MG RE SUPP
650.0000 mg | RECTAL | Status: DC | PRN
  Filled 2015-04-27: qty 1

## 2015-04-27 MED ORDER — SODIUM CHLORIDE 0.9 % IV SOLN: 250.0000 mL | INTRAVENOUS | Status: DC | PRN

## 2015-04-27 MED ORDER — FENTANYL CITRATE (PF) 100 MCG/2ML IJ SOLN
INTRAMUSCULAR | Status: DC | PRN
  Administered 2015-04-27: 25 ug via INTRAVENOUS

## 2015-04-27 MED ORDER — SODIUM CHLORIDE 0.9 % IJ SOLN: 3.0000 mL | INTRAMUSCULAR | Status: DC | PRN

## 2015-04-27 MED ORDER — BUPIVACAINE-EPINEPHRINE (PF) 0.25% -1:200000 IJ SOLN
INTRAMUSCULAR | Status: AC
  Filled 2015-04-27: qty 30

## 2015-04-27 MED ORDER — LIDOCAINE HCL 1 % IJ SOLN
INTRAMUSCULAR | Status: AC
  Filled 2015-04-27: qty 20

## 2015-04-27 MED ORDER — PROMETHAZINE HCL 25 MG/ML IJ SOLN: 6.2500 mg | INTRAMUSCULAR | Status: DC | PRN

## 2015-04-27 MED ORDER — MEPERIDINE HCL 50 MG/ML IJ SOLN: 6.2500 mg | INTRAMUSCULAR | Status: DC | PRN

## 2015-04-27 MED ORDER — FENTANYL CITRATE (PF) 100 MCG/2ML IJ SOLN
INTRAMUSCULAR | Status: AC
  Filled 2015-04-27: qty 2

## 2015-04-27 MED ORDER — LIDOCAINE HCL (PF) 1 % IJ SOLN
INTRAMUSCULAR | Status: DC | PRN
  Administered 2015-04-27: 5 mL

## 2015-04-27 SURGICAL SUPPLY — 40 items
ADH SKN CLS APL DERMABOND .7 (GAUZE/BANDAGES/DRESSINGS) ×1
APL SKNCLS STERI-STRIP NONHPOA (GAUZE/BANDAGES/DRESSINGS)
BENZOIN TINCTURE PRP APPL 2/3 (GAUZE/BANDAGES/DRESSINGS) ×1 IMPLANT
BLADE SURG 15 STRL LF DISP TIS (BLADE) ×1 IMPLANT
BLADE SURG 15 STRL SS (BLADE) ×2
CHLORAPREP W/TINT 10.5 ML (MISCELLANEOUS) ×2 IMPLANT
COVER SURGICAL LIGHT HANDLE (MISCELLANEOUS) ×2 IMPLANT
DECANTER SPIKE VIAL GLASS SM (MISCELLANEOUS) ×2 IMPLANT
DERMABOND ADVANCED (GAUZE/BANDAGES/DRESSINGS) ×1
DERMABOND ADVANCED .7 DNX12 (GAUZE/BANDAGES/DRESSINGS) IMPLANT
DRAPE LAPAROTOMY TRNSV 102X78 (DRAPE) IMPLANT
ELECT COATED BLADE 2.86 ST (ELECTRODE) IMPLANT
ELECT REM PT RETURN 9FT ADLT (ELECTROSURGICAL) ×2
ELECTRODE REM PT RTRN 9FT ADLT (ELECTROSURGICAL) ×1 IMPLANT
GAUZE SPONGE 4X4 12PLY STRL (GAUZE/BANDAGES/DRESSINGS) ×2 IMPLANT
GAUZE SPONGE 4X4 16PLY XRAY LF (GAUZE/BANDAGES/DRESSINGS) IMPLANT
GLOVE BIO SURGEON STRL SZ 6 (GLOVE) ×2 IMPLANT
GLOVE BIO SURGEON STRL SZ 6.5 (GLOVE) IMPLANT
GLOVE BIOGEL PI IND STRL 6.5 (GLOVE) ×1 IMPLANT
GLOVE BIOGEL PI IND STRL 7.0 (GLOVE) ×1 IMPLANT
GLOVE BIOGEL PI INDICATOR 6.5 (GLOVE) ×1
GLOVE BIOGEL PI INDICATOR 7.0 (GLOVE) ×1
GLOVE INDICATOR 6.5 STRL GRN (GLOVE) ×4 IMPLANT
GOWN STRL REUS W/ TWL XL LVL3 (GOWN DISPOSABLE) ×3 IMPLANT
GOWN STRL REUS W/TWL 2XL LVL3 (GOWN DISPOSABLE) ×2 IMPLANT
GOWN STRL REUS W/TWL XL LVL3 (GOWN DISPOSABLE)
KIT BASIN OR (CUSTOM PROCEDURE TRAY) ×2 IMPLANT
LIQUID BAND (GAUZE/BANDAGES/DRESSINGS) IMPLANT
MARKER SKIN DUAL TIP RULER LAB (MISCELLANEOUS) ×2 IMPLANT
NDL HYPO 25X1 1.5 SAFETY (NEEDLE) ×1 IMPLANT
NEEDLE HYPO 22GX1.5 SAFETY (NEEDLE) IMPLANT
NEEDLE HYPO 25X1 1.5 SAFETY (NEEDLE) ×2 IMPLANT
PACK BASIC VI WITH GOWN DISP (CUSTOM PROCEDURE TRAY) ×2 IMPLANT
PENCIL BUTTON HOLSTER BLD 10FT (ELECTRODE) ×2 IMPLANT
STRIP CLOSURE SKIN 1/2X4 (GAUZE/BANDAGES/DRESSINGS) ×1 IMPLANT
SUT MNCRL AB 4-0 PS2 18 (SUTURE) ×2 IMPLANT
SUT VIC AB 3-0 SH 27 (SUTURE)
SUT VIC AB 3-0 SH 27X BRD (SUTURE) IMPLANT
SYR CONTROL 10ML LL (SYRINGE) ×2 IMPLANT
TOWEL OR 17X26 10 PK STRL BLUE (TOWEL DISPOSABLE) ×2 IMPLANT

## 2015-04-27 NOTE — Discharge Instructions (Signed)
[place] Office Phone Number [phone]   POST OP INSTRUCTIONS  Always review your discharge instruction sheet given to you by the facility where your surgery was performed.  IF YOU HAVE DISABILITY OR FAMILY LEAVE FORMS, YOU MUST BRING THEM TO THE OFFICE FOR PROCESSING.  DO NOT GIVE THEM TO YOUR DOCTOR.  1. A prescription for pain medication may be given to you upon discharge.  Take your pain medication as prescribed, if needed.  If narcotic pain medicine is not needed, then you may take acetaminophen (Tylenol) or ibuprofen (Advil) as needed. 2. Take your usually prescribed medications unless otherwise directed 3. If you need a refill on your pain medication, please contact your pharmacy.  They will contact our office to request authorization.  Prescriptions will not be filled after 5pm or on week-ends. 4. You should eat very light the first 24 hours after surgery, such as soup, crackers, pudding, etc.  Resume your normal diet the day after surgery 5. It is common to experience some constipation if taking pain medication after surgery.  Increasing fluid intake and taking a stool softener will usually help or prevent this problem from occurring.  A mild laxative (Milk of Magnesia or Miralax) should be taken according to package directions if there are no bowel movements after 48 hours. 6. You may shower in 48 hours.  The surgical glue will flake off in 2-3 weeks.   7. ACTIVITIES:  No strenuous activity or heavy lifting for 1 week.   a. You may drive when you no longer are taking prescription pain medication, you can comfortably wear a seatbelt, and you can safely maneuver your car and apply brakes. b. RETURN TO WORK:  __________as tolerated as long as no lifting.  _______________ Dennis Bast should see your doctor in the office for a follow-up appointment approximately three-four weeks after your surgery.    WHEN TO CALL YOUR DOCTOR: 1. Fever over 101.0 2. Nausea and/or  vomiting. 3. Extreme swelling or bruising. 4. Continued bleeding from incision. 5. Increased pain, redness, or drainage from the incision.  The clinic staff is available to answer your questions during regular business hours.  Please dont hesitate to call and ask to speak to one of the nurses for clinical concerns.  If you have a medical emergency, go to the nearest emergency room or call 911.  A surgeon from [facility] is always on call at the hospital.  For further questions, please visit [email or web address]

## 2015-04-27 NOTE — Anesthesia Postprocedure Evaluation (Addendum)
  Anesthesia Post-op Note  Patient: Keith Forbes  Procedure(s) Performed: Procedure(s) (LRB): REMOVAL PORT-A-CATH (N/A)  Patient Location: PACU  Anesthesia Type: General  Level of Consciousness: awake and alert   Airway and Oxygen Therapy: Patient Spontanous Breathing  Post-op Pain: mild  Post-op Assessment: Post-op Vital signs reviewed, Patient's Cardiovascular Status Stable, Respiratory Function Stable, Patent Airway and No signs of Nausea or vomiting  Last Vitals:  Filed Vitals:   04/27/15 1837  BP: 110/57  Pulse: 54  Temp:   Resp: 16    Post-op Vital Signs: stable   Complications: No apparent anesthesia complications

## 2015-04-27 NOTE — Anesthesia Procedure Notes (Signed)
Procedure Name: LMA Insertion Performed by: Teresha Hanks J Pre-anesthesia Checklist: Patient identified, Emergency Drugs available, Suction available, Patient being monitored and Timeout performed Patient Re-evaluated:Patient Re-evaluated prior to inductionOxygen Delivery Method: Circle system utilized Preoxygenation: Pre-oxygenation with 100% oxygen Intubation Type: IV induction Ventilation: Mask ventilation without difficulty LMA: LMA inserted LMA Size: 4.0 Number of attempts: 1 Placement Confirmation: positive ETCO2,  CO2 detector and breath sounds checked- equal and bilateral Tube secured with: Tape Dental Injury: Teeth and Oropharynx as per pre-operative assessment        

## 2015-04-27 NOTE — Transfer of Care (Signed)
Immediate Anesthesia Transfer of Care Note  Patient: Keith Forbes  Procedure(s) Performed: Procedure(s): REMOVAL PORT-A-CATH (N/A)  Patient Location: PACU  Anesthesia Type:General  Level of Consciousness: sedated, patient cooperative and responds to stimulation  Airway & Oxygen Therapy: Patient Spontanous Breathing and Patient connected to face mask oxygen  Post-op Assessment: Report given to RN and Post -op Vital signs reviewed and stable  Post vital signs: Reviewed and stable  Last Vitals:  Filed Vitals:   04/27/15 1144  BP: 115/69  Pulse: 64  Temp: 36.6 C  Resp: 16    Complications: No apparent anesthesia complications

## 2015-04-27 NOTE — H&P (Signed)
Keith Forbes is an 49 y.o. male.   Chief Complaint: port and upper extremity DVT HPI:  Pt is a 49 yo M with Hodgkin's lymphoma.  He has completed his therapy.  He was supposed to get a PET, but the machine was malfunctioning.  He presented to oncology yesterday with left upper extremity swelling.  He was found to have a DVT in the left IJ.  He also had mild neck discomfort.  He presents for urgent port removal.    Past Medical History  Diagnosis Date  . Hernia   . Hyperlipidemia   . Hypercholesteremia   . Pars defect of lumbar spine     bilateral 2013 (Dr. Sherwood Gambler)  . GERD (gastroesophageal reflux disease)   . Wears contact lenses   . Cancer     hodgkin lymphoma  . Hodgkin lymphoma 09/11/2014  . Lymphoma     Past Surgical History  Procedure Laterality Date  . Lymph node biopsy  12/15  . Portacath placement Left 09/15/2014    Procedure: INSERTION PORT-A-CATH WITH ULTRA SOUND;  Surgeon: Fanny Skates, MD;  Location: [facility];  Service: General;  Laterality: Left;    Family History  Problem Relation Age of Onset  . Hyperlipidemia Father   . Heart disease Father   . Cancer Paternal Aunt     breast ca   Social History:  reports that he has never smoked. He has never used smokeless tobacco. He reports that he drinks alcohol. He reports that he does not use illicit drugs.  Allergies: No Known Allergies  Medications Prior to Admission  Medication Sig Dispense Refill  . Ascorbic Acid (VITAMIN C PO) Take 1 tablet by mouth daily.    Marland Kitchen aspirin EC 81 MG tablet Take 81 mg by mouth daily.    Marland Kitchen docusate sodium (COLACE) 100 MG capsule Take 100 mg by mouth 2 (two) times daily as needed for mild constipation.    . fexofenadine (ALLEGRA) 180 MG tablet Take 180 mg by mouth daily as needed for allergies or rhinitis.    . polyethylene glycol (MIRALAX / GLYCOLAX) packet Take 17 g by mouth daily as needed for moderate constipation.    . pravastatin (PRAVACHOL) 20 MG tablet  TAKE 1 TABLET BY MOUTH AT BEDTIME (Patient not taking: Reported on 04/24/2015) 30 tablet 5  . Rivaroxaban (XARELTO STARTER PACK) 15 & 20 MG TBPK Take as directed on package: Take one 15mg  tablet by mouth twice daily with food. On Day 22, switch to one 20mg  tablet daily with food. (Patient not taking: Reported on 04/26/2015) 51 each 0    No results found for this or any previous visit (from the past 48 hour(s)). No results found.  Review of Systems  All other systems reviewed and are negative.   Blood pressure 115/69, pulse 64, temperature 97.9 F (36.6 C), temperature source Oral, resp. rate 16, SpO2 100 %. Physical Exam  Constitutional: He is oriented to person, place, and time. He appears well-developed and well-nourished. No distress.  HENT:  Head: Normocephalic.  Eyes: Conjunctivae are normal. Pupils are equal, round, and reactive to light. No scleral icterus.  Neck:  Superficial venous distention.   Cardiovascular: Normal rate.   Respiratory: Effort normal. No respiratory distress.  GI: Soft. He exhibits no distension.  Neurological: He is alert and oriented to person, place, and time.  Skin: Skin is warm and dry. He is not diaphoretic.  Psychiatric: He has a normal mood and affect. His behavior is  normal. Judgment and thought content normal.     Assessment/Plan Left IJ DVT with port a cath in place in the left subclavian vein. Plan removal. Discussed risks and benefits.  Primary risk is bleeding since he in s/p lovenox therapeutic dose yesterday and is to start xarelto in the next 24 hours.    Keith Forbes 04/27/2015, 11:55 AM

## 2015-04-27 NOTE — Anesthesia Preprocedure Evaluation (Signed)
Anesthesia Evaluation  Patient identified by MRN, date of birth, ID band Patient awake    Reviewed: Allergy & Precautions, H&P , NPO status , Patient's Chart, lab work & pertinent test results  Airway Mallampati: I  TM Distance: >3 FB Neck ROM: Full    Dental  (+) Teeth Intact, Dental Advisory Given   Pulmonary  breath sounds clear to auscultation        Cardiovascular Rhythm:Regular Rate:Normal     Neuro/Psych    GI/Hepatic   Endo/Other    Renal/GU      Musculoskeletal   Abdominal   Peds  Hematology   Anesthesia Other Findings   Reproductive/Obstetrics                             Anesthesia Physical  Anesthesia Plan  ASA: II  Anesthesia Plan: General   Post-op Pain Management:    Induction: Intravenous  Airway Management Planned: LMA  Additional Equipment:   Intra-op Plan:   Post-operative Plan:   Informed Consent: I have reviewed the patients History and Physical, chart, labs and discussed the procedure including the risks, benefits and alternatives for the proposed anesthesia with the patient or authorized representative who has indicated his/her understanding and acceptance.   Dental advisory given  Plan Discussed with: CRNA and Anesthesiologist  Anesthesia Plan Comments: (Hodgkins Lymphoma  Plan GA with LMA  Roberts Gaudy)        Anesthesia Quick Evaluation

## 2015-04-27 NOTE — Op Note (Signed)
  PRE-OPERATIVE DIAGNOSIS:  un-needed Port-A-Cath for lymphoma, has left IJ DVT  POST-OPERATIVE DIAGNOSIS:  Same   PROCEDURE:  Procedure(s):  REMOVAL PORT-A-CATH  SURGEON:  Surgeon(s):  Stark Klein, MD  ANESTHESIA:   LMA + local   EBL:   Minimal  SPECIMEN:  None  Complications : none known  Procedure:   Pt was  identified in the holding area and taken to the operating room where she was placed supine on the operating room table.  General anesthesia via LMA was induced.   The left upper chest was prepped and draped.  The prior incision was anesthetized with local anesthetic.  The incision was opened with a #15 blade.  The subcutaneous tissue was divided with the cautery.  The port was identified and the capsule opened.  The 4 2-0 prolene sutures were removed.  The port was then removed and pressure held on the tract.  The catheter appeared intact without evidence of breakage.  The wound was inspected for hemostasis, which was achieved with cautery.  The wound was closed with 3-0 vicryl deep dermal interrupted sutures and 4-0 Monocryl running subcuticular suture.  The wound was cleaned, dried, and dressed with dermabond.  The patient was awakened from anesthesia and taken to the PACU in stable condition.  Needle, sponge, and instrument counts are correct.

## 2015-04-28 ENCOUNTER — Encounter (HOSPITAL_COMMUNITY): Payer: Self-pay | Admitting: General Surgery

## 2015-05-04 ENCOUNTER — Encounter (HOSPITAL_COMMUNITY): Payer: 59

## 2015-05-04 ENCOUNTER — Encounter (HOSPITAL_COMMUNITY)
Admission: RE | Admit: 2015-05-04 | Discharge: 2015-05-04 | Disposition: A | Discharge status: Home / Self Care | Payer: 59 | Source: Ambulatory Visit | Attending: Hematology and Oncology | Admitting: Hematology and Oncology

## 2015-05-04 DIAGNOSIS — C8190 Hodgkin lymphoma, unspecified, unspecified site: Secondary | ICD-10-CM | POA: Diagnosis present

## 2015-05-04 LAB — GLUCOSE, CAPILLARY: Glucose-Capillary: 88 mg/dL (ref 65–99)

## 2015-05-04 MED ORDER — FLUDEOXYGLUCOSE F - 18 (FDG) INJECTION
10.0000 | Freq: Once | INTRAVENOUS | Status: AC | PRN
  Administered 2015-05-04: 10 via INTRAVENOUS

## 2015-05-05 ENCOUNTER — Encounter: Payer: Self-pay | Admitting: Hematology and Oncology

## 2015-05-05 ENCOUNTER — Ambulatory Visit (HOSPITAL_BASED_OUTPATIENT_CLINIC_OR_DEPARTMENT_OTHER): Payer: 59 | Admitting: Hematology and Oncology

## 2015-05-05 ENCOUNTER — Telehealth: Payer: Self-pay | Admitting: Hematology and Oncology

## 2015-05-05 VITALS — BP 119/65 | HR 72 | Temp 98.8°F | Resp 17 | Ht 70.0 in | Wt 193.2 lb

## 2015-05-05 DIAGNOSIS — C8190 Hodgkin lymphoma, unspecified, unspecified site: Secondary | ICD-10-CM

## 2015-05-05 DIAGNOSIS — Z86718 Personal history of other venous thrombosis and embolism: Secondary | ICD-10-CM

## 2015-05-05 DIAGNOSIS — M899 Disorder of bone, unspecified: Secondary | ICD-10-CM | POA: Diagnosis not present

## 2015-05-05 DIAGNOSIS — I82622 Acute embolism and thrombosis of deep veins of left upper extremity: Secondary | ICD-10-CM

## 2015-05-05 HISTORY — DX: Disorder of bone, unspecified: M89.9

## 2015-05-05 NOTE — Assessment & Plan Note (Signed)
He is doing well on anticoagulation therapy without bleeding complication. Minimum duration of anticoagulation would be 3 months.

## 2015-05-05 NOTE — Progress Notes (Signed)
[facility] OFFICE PROGRESS NOTE  Patient Care Team: [name], MD as PCP - General (Family Medicine)  SUMMARY OF ONCOLOGIC HISTORY: Oncology History   Hodgkin lymphoma   Staging form: Lymphoid Neoplasms, AJCC 6th Edition     Clinical stage from 09/13/2014: Stage III - Signed by [name], MD on 09/13/2014       Hodgkin lymphoma   09/04/2014 Pathology Results Accession: [id number] inguinal lymph node biopsy confirmed Hodgkin lymphoma   09/04/2014 Procedure Dr. Dalbert Batman performed excision of left inguinal lymph node biopsy   09/11/2014 Imaging CT scan of the chest, abdomen and pelvis show disease both above and below the diaphragm   09/16/2014 Imaging Echocardiogram is negative   09/18/2014 Bone Marrow Biopsy Bone marrow aspirate and biopsy is negative   09/22/2014 -  Chemotherapy  the patient was started on  ABVD   10/06/2014 Adverse Reaction  treatment was delayed due to severe leukopenia. Future dose modification was made for abnormal liver function tests, leukopenia and neuropathy   11/23/2014 Imaging PET CT scan showed near complete response to Rx   12/23/2014 Adverse Reaction Bleomycin was discontinued due to suspected bleomycin toxicity   04/25/2015 Imaging US venous Doppller left upper extremity showed acute DVT noted in left internal jugular vein. All other veins appear thrombus free   04/27/2015 Surgery He had port removal.   05/04/2015 Imaging PET CT scan showed no evidence of disease.    INTERVAL HISTORY: Please see below for problem oriented charting. He returns to review test results. He is doing well. He has very minimal residual pain in his neck and the port sites. The patient denies any recent signs or symptoms of bleeding such as spontaneous epistaxis, hematuria or hematochezia.   REVIEW OF SYSTEMS:   Constitutional: Denies fevers, chills or abnormal weight loss Eyes: Denies blurriness of vision Ears, nose, mouth, throat, and face: Denies  mucositis or sore throat Respiratory: Denies cough, dyspnea or wheezes Cardiovascular: Denies palpitation, chest discomfort or lower extremity swelling Gastrointestinal:  Denies nausea, heartburn or change in bowel habits Skin: Denies abnormal skin rashes Lymphatics: Denies new lymphadenopathy or easy bruising Neurological:Denies numbness, tingling or new weaknesses Behavioral/Psych: Mood is stable, no new changes  All other systems were reviewed with the patient and are negative.  I have reviewed the past medical history, past surgical history, social history and family history with the patient and they are unchanged from previous note.  ALLERGIES:  has No Known Allergies.  MEDICATIONS:  Current Outpatient Prescriptions  Medication Sig Dispense Refill  . docusate sodium (COLACE) 100 MG capsule Take 100 mg by mouth 2 (two) times daily as needed for mild constipation.    . fexofenadine (ALLEGRA) 180 MG tablet Take 180 mg by mouth daily as needed for allergies or rhinitis.    . Rivaroxaban (XARELTO STARTER PACK) 15 & 20 MG TBPK Take as directed on package: Take one 74m tablet by mouth twice daily with food. On Day 22, switch to one 244mtablet daily with food. 51 each 0   No current facility-administered medications for this visit.    PHYSICAL EXAMINATION: ECOG PERFORMANCE STATUS: 0 - Asymptomatic  Filed Vitals:   05/05/15 0913  BP: 119/65  Pulse: 72  Temp: 98.8 F (37.1 C)  Resp: 17   Filed Weights   05/05/15 0913  Weight: 193 lb 4 oz (87.658 kg)    GENERAL:alert, no distress and comfortable SKIN: skin color, texture, turgor are normal, no rashes or significant lesions EYES:  normal, Conjunctiva are pink and non-injected, sclera clear Musculoskeletal:no cyanosis of digits and no clubbing  NEURO: alert & oriented x 3 with fluent speech, no focal motor/sensory deficits  LABORATORY DATA:  I have reviewed the data as listed    Component Value Date/Time   NA 142 04/25/2015  1006   NA 140 04/14/2015 0901   K 4.1 04/25/2015 1006   K 4.2 04/14/2015 0901   CL 105 04/25/2015 1006   CO2 32 04/25/2015 1006   CO2 26 04/14/2015 0901   GLUCOSE 87 04/25/2015 1006   GLUCOSE 101 04/14/2015 0901   BUN 11 04/25/2015 1006   BUN 12.6 04/14/2015 0901   CREATININE 1.05 04/25/2015 1006   CREATININE 1.0 04/14/2015 0901   CREATININE 1.00 09/11/2014 0808   CALCIUM 9.7 04/25/2015 1006   CALCIUM 9.7 04/14/2015 0901   PROT 6.7 04/14/2015 0901   PROT 7.4 09/24/2014 1724   ALBUMIN 4.0 04/14/2015 0901   ALBUMIN 3.5 09/24/2014 1724   AST 31 04/14/2015 0901   AST 29 09/24/2014 1724   ALT 34 04/14/2015 0901   ALT 30 09/24/2014 1724   ALKPHOS 74 04/14/2015 0901   ALKPHOS 147* 09/24/2014 1724   BILITOT 1.30* 04/14/2015 0901   BILITOT 0.9 09/24/2014 1724   GFRNONAA >60 04/25/2015 1006   GFRNONAA 89 09/11/2014 0808   GFRAA >60 04/25/2015 1006   GFRAA >89 09/11/2014 0808    No results found for: SPEP, UPEP  Lab Results  Component Value Date   WBC 6.4 04/25/2015   NEUTROABS 3.7 04/25/2015   HGB 13.8 04/25/2015   HCT 41.4 04/25/2015   MCV 94.7 04/25/2015   PLT 138* 04/25/2015      Chemistry      Component Value Date/Time   NA 142 04/25/2015 1006   NA 140 04/14/2015 0901   K 4.1 04/25/2015 1006   K 4.2 04/14/2015 0901   CL 105 04/25/2015 1006   CO2 32 04/25/2015 1006   CO2 26 04/14/2015 0901   BUN 11 04/25/2015 1006   BUN 12.6 04/14/2015 0901   CREATININE 1.05 04/25/2015 1006   CREATININE 1.0 04/14/2015 0901   CREATININE 1.00 09/11/2014 0808      Component Value Date/Time   CALCIUM 9.7 04/25/2015 1006   CALCIUM 9.7 04/14/2015 0901   ALKPHOS 74 04/14/2015 0901   ALKPHOS 147* 09/24/2014 1724   AST 31 04/14/2015 0901   AST 29 09/24/2014 1724   ALT 34 04/14/2015 0901   ALT 30 09/24/2014 1724   BILITOT 1.30* 04/14/2015 0901   BILITOT 0.9 09/24/2014 1724       RADIOGRAPHIC STUDIES: I have personally reviewed the radiological images as listed and agreed  with the findings in the report. Nm Pet Image Restag (ps) Skull Base To Thigh  05/04/2015   CLINICAL DATA:  49 year old male presenting for restaging of Hodgkin's lymphoma, assess treatment response.  EXAM: NUCLEAR MEDICINE PET SKULL BASE TO THIGH  TECHNIQUE: 10 mCi F-18 FDG was injected intravenously. Full-ring PET imaging was performed from the skull base to thigh after the radiotracer. CT data was obtained and used for attenuation correction and anatomic localization.  FASTING BLOOD GLUCOSE:  Value: 88 mg/dl  COMPARISON:  11/23/2014 PET-CT.  FINDINGS: NECK  No hypermetabolic lymph nodes in the neck.  CHEST  There is a minimally hypermetabolic 2.5 x 1.1 cm right internal mammary chain lymph node with max SUV 1.8 (previous max SUV 1.8), slightly less than the mediastinal blood pool activity, and slightly decreased in size from  2.8 x 1.2 cm. There is a minimally hypermetabolic 0.6 cm short axis subcarinal node with max SUV 1.9 (previous max SUV 3.0), equal to the mediastinal blood pool activity, and slightly decreased in size from 0.9 cm. There are no additional hypermetabolic or pathologically enlarged mediastinal or hilar lymph nodes. There is atherosclerosis of the thoracic aorta, the great vessels of the mediastinum and the coronary arteries, including calcified atherosclerotic plaque in the left anterior descending coronary arteries. Stable mild tree-in-bud opacities in the right upper lobe. Otherwise no significant pulmonary nodules on CT.  ABDOMEN/PELVIS  No abnormal hypermetabolic activity within the liver, pancreas, adrenal glands, or spleen. No hypermetabolic or pathologically enlarged lymph nodes in the abdomen or pelvis. Atherosclerotic nonaneurysmal abdominal aorta.  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 There is a persistent nonspecific curvilinear focus of hypermetabolism in the left proximal femoral shaft with max SUV 4.3 (previous max SUV 3.1), without anatomic correlate.  IMPRESSION: 1. Continued interval response to therapy, with decreased sizes of minimally hypermetabolic right internal mammary and subcarinal lymph nodes. No new hypermetabolic lymphadenopathy. Findings are in keeping with Deauville Score 2 (dominant nodes match but do not exceed in the background activity of the mediastinum). 2. Stable chronic non-hypermetabolic mild asymmetric cortical thickening and asymmetric intramedullary sclerosis involving the entire left iliac wing and left femoral neck; favor mild Paget's disease. Persistent nonspecific curvilinear hypermetabolic focus in the left proximal femoral shaft without an anatomic correlate, likely related to the suspected Paget's disease in the left hemipelvis. Consider further evaluation of these skeletal findings with dedicated pelvic and left femur radiographs and/or pelvic and left hip MRI.   Electronically Signed   By: Ilona Sorrel M.D.   On: 05/04/2015 18:00     ASSESSMENT & PLAN:  Hodgkin lymphoma PET CT scan show near complete response with residual lymphadenopathy but no SUV activity. I recommend repeat blood work history and examination in 3 months and repeat imaging in 6 months.  DVT of upper extremity (deep vein thrombosis) He is doing well on anticoagulation therapy without bleeding complication. Minimum duration of anticoagulation would be 3 months.  Bone lesion The cause is unknown. I reviewed the imaging study in great detail with him and his wife. I recommend orthopedic consultation and he agreed to proceed. We will get copies of imaging studies made into a CD so he can bring it to the orthopedist's office.   Orders Placed This Encounter  Procedures  . Ambulatory  referral to Orthopedic Surgery    Referral Priority:  Routine    Referral Type:  Surgical    Referral Reason:  Specialty Services Required    Requested Specialty:  Orthopedic Surgery    Number of Visits Requested:  1   All questions were answered. The patient knows to call the clinic with any problems, questions or concerns. No barriers to learning was detected. I spent 25 minutes counseling the patient face to face. The total time spent in the appointment was 30 minutes and more than 50% was on counseling and review of test results     [facility], Brecklyn Galvis, MD 05/05/2015 1:18 PM

## 2015-05-05 NOTE — Assessment & Plan Note (Signed)
PET CT scan show near complete response with residual lymphadenopathy but no SUV activity. I recommend repeat blood work history and examination in 3 months and repeat imaging in 6 months.

## 2015-05-05 NOTE — Assessment & Plan Note (Signed)
The cause is unknown. I reviewed the imaging study in great detail with him and his wife. I recommend orthopedic consultation and he agreed to proceed. We will get copies of imaging studies made into a CD so he can bring it to the orthopedist's office.

## 2015-05-05 NOTE — Telephone Encounter (Signed)
Pt confirmed labs/ov per 08/03 POF, gave pt avs and calendar.... KJ, sent referral to HIM to contact office to contact pt to schedule .... Cherylann Banas

## 2015-05-06 ENCOUNTER — Telehealth: Payer: Self-pay | Admitting: *Deleted

## 2015-05-06 ENCOUNTER — Telehealth: Payer: Self-pay | Admitting: Hematology and Oncology

## 2015-05-06 NOTE — Telephone Encounter (Signed)
Wife reports pt has appt w/ Ortho tomorrow and need scans on disc.   Informed her scans are ready to pick up at [facility] [place].  She verbalized understanding.

## 2015-05-06 NOTE — Telephone Encounter (Signed)
Faxed pt medcial records to [employer] [facility] [employer] [phone]

## 2015-05-07 ENCOUNTER — Telehealth: Payer: Self-pay | Admitting: *Deleted

## 2015-05-07 NOTE — Telephone Encounter (Signed)
THERE WAS A MISUNDERSTANDING AT DR.KRAMER'S OFFICE CONCERNING WHY PT. WAS TO BE SEEN. SPOKE TO THE PHYSICIAN ASSISTANT AND INFORMED HIM THAT [facility] WANTED PT. SEEN BY AN ORTHOPEDIC PHYSICIAN BECAUSE PT. HAS BONE LESION ON HIS PET SCAN. SPOKE TO PT.'S WIFE LATER. SHE SAID PT. HAS AN APPOINTMENT WITH DR.MURPHY ON 05/10/15 AT 8:30AM.

## 2015-05-25 ENCOUNTER — Other Ambulatory Visit: Payer: Self-pay | Admitting: Hematology and Oncology

## 2015-06-02 ENCOUNTER — Other Ambulatory Visit: Payer: Self-pay | Admitting: Hematology and Oncology

## 2015-06-02 ENCOUNTER — Telehealth: Payer: Self-pay | Admitting: *Deleted

## 2015-06-02 MED ORDER — RIVAROXABAN 20 MG PO TABS: 20.0000 mg | ORAL_TABLET | Freq: Every day | ORAL | Status: DC

## 2015-06-02 NOTE — Telephone Encounter (Signed)
Called wife Cari on cell phone and left message re:  Per Dr. Alvy Bimler, Xarelto was refilled electronically.  OK for pt to go to the gym as per md.

## 2015-06-02 NOTE — Telephone Encounter (Signed)
Received call from wife Cari requesting refill of Xarelto for pt.  Cari also stated pt has been released by the surgeon.  Cari would like to know from Dr. Alvy Bimler if pt is okay to go back to the gym.    Cari's   Phone      [phone].

## 2015-06-02 NOTE — Telephone Encounter (Signed)
OK to go to the gym Refill done electronically

## 2015-06-23 ENCOUNTER — Ambulatory Visit (INDEPENDENT_AMBULATORY_CARE_PROVIDER_SITE_OTHER): Payer: 59 | Admitting: Physician Assistant

## 2015-06-23 ENCOUNTER — Encounter: Payer: Self-pay | Admitting: Family Medicine

## 2015-06-23 ENCOUNTER — Encounter: Payer: Self-pay | Admitting: Physician Assistant

## 2015-06-23 VITALS — BP 120/68 | HR 98 | Temp 98.5°F | Resp 18 | Wt 187.0 lb

## 2015-06-23 DIAGNOSIS — R197 Diarrhea, unspecified: Secondary | ICD-10-CM

## 2015-06-23 DIAGNOSIS — C8190 Hodgkin lymphoma, unspecified, unspecified site: Secondary | ICD-10-CM | POA: Diagnosis not present

## 2015-06-23 LAB — CBC WITH DIFFERENTIAL/PLATELET
Basophils Absolute: 0 10*3/uL (ref 0.0–0.1)
Basophils Relative: 0 % (ref 0–1)
EOS PCT: 1 % (ref 0–5)
Eosinophils Absolute: 0.1 10*3/uL (ref 0.0–0.7)
HCT: 47.9 % (ref 39.0–52.0)
Hemoglobin: 16.1 g/dL (ref 13.0–17.0)
LYMPHS ABS: 1.2 10*3/uL (ref 0.7–4.0)
Lymphocytes Relative: 17 % (ref 12–46)
MCH: 30.4 pg (ref 26.0–34.0)
MCHC: 33.6 g/dL (ref 30.0–36.0)
MCV: 90.4 fL (ref 78.0–100.0)
MONO ABS: 0.8 10*3/uL (ref 0.1–1.0)
MPV: 10.7 fL (ref 8.6–12.4)
Monocytes Relative: 11 % (ref 3–12)
Neutro Abs: 5.2 10*3/uL (ref 1.7–7.7)
Neutrophils Relative %: 71 % (ref 43–77)
Platelets: 129 10*3/uL — ABNORMAL LOW (ref 150–400)
RBC: 5.3 MIL/uL (ref 4.22–5.81)
RDW: 13 % (ref 11.5–15.5)
WBC: 7.3 10*3/uL (ref 4.0–10.5)

## 2015-06-23 LAB — COMPLETE METABOLIC PANEL WITH GFR
ALT: 23 U/L (ref 9–46)
AST: 23 U/L (ref 10–40)
Albumin: 4.2 g/dL (ref 3.6–5.1)
Alkaline Phosphatase: 73 U/L (ref 40–115)
BUN: 13 mg/dL (ref 7–25)
CALCIUM: 9 mg/dL (ref 8.6–10.3)
CHLORIDE: 100 mmol/L (ref 98–110)
CO2: 28 mmol/L (ref 20–31)
CREATININE: 1.23 mg/dL (ref 0.60–1.35)
GFR, EST AFRICAN AMERICAN: 80 mL/min (ref >=60)
GFR, Est Non African American: 69 mL/min (ref >=60)
Glucose, Bld: 87 mg/dL (ref 70–99)
POTASSIUM: 4.1 mmol/L (ref 3.5–5.3)
Sodium: 136 mmol/L (ref 135–146)
Total Bilirubin: 1.4 mg/dL — ABNORMAL HIGH (ref 0.2–1.2)
Total Protein: 6.6 g/dL (ref 6.1–8.1)

## 2015-06-23 NOTE — Progress Notes (Signed)
Patient ID: Keith Forbes MRN: [id number], DOB: Feb 24, 1966, 49 y.o. Date of Encounter: @DATE @  Chief Complaint:  Chief Complaint  Patient presents with  . Diarrhea    X monday, every time eats gets diarrhea    HPI: 49 y.o. year old white male  presents with above.  He states that he does not usually have problems with diarrhea. Also I saw on his problem list Hodgkin's lymphoma. He says he has had no treatment for this since June. Usually does not have any diarrhea related to this or the therapy with this.  States that this started on Monday 06/21/15. Says he was at work and around lunch time when use the bathroom stool was "like water safety he left and went home ". Says that since then the number of stools per day has gradually been increasing. Had increased to about 4 or 5 loose stools yesterday. Says that he has tried to modify his diet some but says that last night he did eat pork chops rice and apples. Morning had toast peanut butter Gatorade and water mixed and some coffee.  Says that his father in the hospital and so patient has been there with his father quite a bit and even spent the night at the hospital one night last week. He has done no travel recently. Patient has been on no anti-biotic's recently.  He has had no abdominal pain--- other than just a crampy discomfort during the Episodes of diarrhea He has had no fever or chills. He has used no over-the-counter treatment. Says he was trying to let it run its course. As had no vomiting.   Past Medical History  Diagnosis Date  . Hernia   . Hyperlipidemia   . Hypercholesteremia   . Pars defect of lumbar spine     bilateral 2013 (Dr. Sherwood Gambler)  . GERD (gastroesophageal reflux disease)   . Wears contact lenses   . Cancer     hodgkin lymphoma  . Hodgkin lymphoma 09/11/2014  . Lymphoma   . Bone lesion 05/05/2015     Home Meds: Outpatient Prescriptions Prior to Visit  Medication Sig Dispense Refill  . docusate  sodium (COLACE) 100 MG capsule Take 100 mg by mouth 2 (two) times daily as needed for mild constipation.    . fexofenadine (ALLEGRA) 180 MG tablet Take 180 mg by mouth daily as needed for allergies or rhinitis.    . Rivaroxaban (XARELTO STARTER PACK) 15 & 20 MG TBPK Take as directed on package: Take one 15mg  tablet by mouth twice daily with food. On Day 22, switch to one 20mg  tablet daily with food. 51 each 0  . rivaroxaban (XARELTO) 20 MG TABS tablet Take 1 tablet (20 mg total) by mouth daily with supper. 90 tablet 0   No facility-administered medications prior to visit.    Allergies: No Known Allergies  Social History   Social History  . Marital Status: Married    Spouse Name: N/A  . Number of Children: N/A  . Years of Education: N/A   Occupational History  . Not on file.   Social History Main Topics  . Smoking status: Never Smoker   . Smokeless tobacco: Never Used  . Alcohol Use: Yes  . Drug Use: No  . Sexual Activity: Not on file     Comment: married, works in emergency services   Other Topics Concern  . Not on file   Social History Narrative    Family History  Problem Relation Age  of Onset  . Hyperlipidemia Father   . Heart disease Father   . Cancer Paternal Aunt     breast ca     Review of Systems:  See HPI for pertinent ROS. All other ROS negative.    Physical Exam: Blood pressure 120/68, pulse 98, temperature 98.5 F (36.9 C), temperature source Oral, resp. rate 18, weight 187 lb (84.823 kg)., Body mass index is 26.83 kg/(m^2). General: WNWD WM. Appears in no acute distress. Neck: Supple. No thyromegaly. No lymphadenopathy. Lungs: Clear bilaterally to auscultation without wheezes, rales, or rhonchi. Breathing is unlabored. Heart: RRR with S1 S2. No murmurs, rubs, or gallops. Abdomen: Soft, non-tender, non-distended with normoactive bowel sounds. No hepatomegaly. No rebound/guarding. No obvious abdominal masses. No area of tenderness with palpation of  entire abdomen. Musculoskeletal:  Strength and tone normal for age. Extremities/Skin: Warm and dry. Neuro: Alert and oriented X 3. Moves all extremities spontaneously. Gait is normal. CNII-XII grossly in tact. Psych:  Responds to questions appropriately with a normal affect.     ASSESSMENT AND PLAN:  49 y.o. year old male with  1. Diarrhea - CBC with Differential/Platelet - COMPLETE METABOLIC PANEL WITH GFR  2. Hodgkin lymphoma ---IF CBC RETURNS WITH ABNORMAL READINGS, MAY BE SECONDARY TO THIS AND PRIOR TREATMENTS FOR THIS  Told him that for now we will start with getting blood work to evaluate. Told him to stick with very bland diet of only plain crackers and water or ginger ale. Plain and that his intestines are irritated and inflamed and that it is having to do before to digest food. Need bowel rest. Told him to use over-the-counter Imodium and take as directed on the box. If this is not significantly improved over the next 48 hours then follow-up. Letter given for out of work covering out of work Monday 06/21/15 through Friday, 06/25/2015.  If diarrhea worsens significantly or he becomes lightheaded or he develops abdominal pain, f/u immediately. Also, if things are not improving in 48 hours and follow-up.   [address] [place], [place], [facility] 06/23/2015 10:37 AM

## 2015-06-24 ENCOUNTER — Other Ambulatory Visit: Payer: Self-pay | Admitting: *Deleted

## 2015-06-24 ENCOUNTER — Other Ambulatory Visit: Payer: 59

## 2015-06-24 DIAGNOSIS — R197 Diarrhea, unspecified: Secondary | ICD-10-CM

## 2015-06-25 ENCOUNTER — Other Ambulatory Visit: Payer: 59

## 2015-06-26 LAB — CLOSTRIDIUM DIFFICILE BY PCR: CDIFFPCR: NOT DETECTED

## 2015-07-08 ENCOUNTER — Telehealth: Payer: Self-pay | Admitting: *Deleted

## 2015-07-08 NOTE — Telephone Encounter (Signed)
Wife called to see if it was ok for patient to receive flu vaccine.  Reviewed Dr. Calton Dach last note.  Should be fine to get flu vaccine.

## 2015-08-13 ENCOUNTER — Encounter: Payer: Self-pay | Admitting: Hematology and Oncology

## 2015-08-13 ENCOUNTER — Other Ambulatory Visit (HOSPITAL_BASED_OUTPATIENT_CLINIC_OR_DEPARTMENT_OTHER): Payer: 59

## 2015-08-13 ENCOUNTER — Ambulatory Visit (HOSPITAL_BASED_OUTPATIENT_CLINIC_OR_DEPARTMENT_OTHER): Payer: 59 | Admitting: Hematology and Oncology

## 2015-08-13 ENCOUNTER — Telehealth: Payer: Self-pay | Admitting: Hematology and Oncology

## 2015-08-13 ENCOUNTER — Other Ambulatory Visit: Payer: Self-pay | Admitting: Hematology and Oncology

## 2015-08-13 VITALS — BP 113/58 | HR 57 | Temp 98.0°F | Resp 18 | Ht 70.0 in | Wt 192.6 lb

## 2015-08-13 DIAGNOSIS — C8198 Hodgkin lymphoma, unspecified, lymph nodes of multiple sites: Secondary | ICD-10-CM

## 2015-08-13 DIAGNOSIS — Z86718 Personal history of other venous thrombosis and embolism: Secondary | ICD-10-CM | POA: Diagnosis not present

## 2015-08-13 DIAGNOSIS — T451X5A Adverse effect of antineoplastic and immunosuppressive drugs, initial encounter: Secondary | ICD-10-CM

## 2015-08-13 DIAGNOSIS — G620 Drug-induced polyneuropathy: Secondary | ICD-10-CM

## 2015-08-13 DIAGNOSIS — Z8571 Personal history of Hodgkin lymphoma: Secondary | ICD-10-CM | POA: Diagnosis not present

## 2015-08-13 LAB — COMPREHENSIVE METABOLIC PANEL (CC13)
ALBUMIN: 4 g/dL (ref 3.5–5.0)
ALT: 18 U/L (ref 0–55)
ANION GAP: 9 meq/L (ref 3–11)
AST: 22 U/L (ref 5–34)
Alkaline Phosphatase: 68 U/L (ref 40–150)
BUN: 15.6 mg/dL (ref 7.0–26.0)
CALCIUM: 9.5 mg/dL (ref 8.4–10.4)
CHLORIDE: 106 meq/L (ref 98–109)
CO2: 25 mEq/L (ref 22–29)
CREATININE: 1.1 mg/dL (ref 0.7–1.3)
EGFR: 79 mL/min/{1.73_m2} — AB (ref >90)
Glucose: 85 mg/dl (ref 70–140)
Potassium: 4.5 mEq/L (ref 3.5–5.1)
Sodium: 139 mEq/L (ref 136–145)
Total Bilirubin: 1.61 mg/dL — ABNORMAL HIGH (ref 0.20–1.20)
Total Protein: 6.7 g/dL (ref 6.4–8.3)

## 2015-08-13 LAB — CBC WITH DIFFERENTIAL/PLATELET
BASO%: 1.1 % (ref 0.0–2.0)
BASOS ABS: 0.1 10*3/uL (ref 0.0–0.1)
EOS ABS: 0.2 10*3/uL (ref 0.0–0.5)
EOS%: 3.2 % (ref 0.0–7.0)
HEMATOCRIT: 43.3 % (ref 38.4–49.9)
HGB: 14.6 g/dL (ref 13.0–17.1)
LYMPH#: 1.6 10*3/uL (ref 0.9–3.3)
LYMPH%: 30.5 % (ref 14.0–49.0)
MCH: 30.7 pg (ref 27.2–33.4)
MCHC: 33.7 g/dL (ref 32.0–36.0)
MCV: 91 fL (ref 79.3–98.0)
MONO#: 0.4 10*3/uL (ref 0.1–0.9)
MONO%: 7.5 % (ref 0.0–14.0)
NEUT#: 3.1 10*3/uL (ref 1.5–6.5)
NEUT%: 57.7 % (ref 39.0–75.0)
Platelets: 161 10*3/uL (ref 140–400)
RBC: 4.76 10*6/uL (ref 4.20–5.82)
RDW: 13.2 % (ref 11.0–14.6)
WBC: 5.3 10*3/uL (ref 4.0–10.3)

## 2015-08-13 NOTE — Telephone Encounter (Signed)
Gave patient avs report and appointments for February 2017. [facility] will call re pet - patient aware.

## 2015-08-13 NOTE — Assessment & Plan Note (Signed)
He complained of left upper extremity discomfort. He does not bother him. There is no further workup or treatment necessary.

## 2015-08-13 NOTE — Progress Notes (Signed)
[place] OFFICE PROGRESS NOTE  Patient Care Team: [name], MD as PCP - General (Family Medicine)  SUMMARY OF ONCOLOGIC HISTORY: Oncology History   Hodgkin lymphoma   Staging form: Lymphoid Neoplasms, AJCC 6th Edition     Clinical stage from 09/13/2014: Stage III - Signed by [name], MD on 09/13/2014       History of hodgkin's lymphoma   09/04/2014 Pathology Results Accession: [id number] inguinal lymph node biopsy confirmed Hodgkin lymphoma   09/04/2014 Procedure Dr. Dalbert Batman performed excision of left inguinal lymph node biopsy   09/11/2014 Imaging CT scan of the chest, abdomen and pelvis show disease both above and below the diaphragm   09/16/2014 Imaging Echocardiogram is negative   09/18/2014 Bone Marrow Biopsy Bone marrow aspirate and biopsy is negative   09/22/2014 -  Chemotherapy  the patient was started on  ABVD   10/06/2014 Adverse Reaction  treatment was delayed due to severe leukopenia. Future dose modification was made for abnormal liver function tests, leukopenia and neuropathy   11/23/2014 Imaging PET CT scan showed near complete response to Rx   12/23/2014 Adverse Reaction Bleomycin was discontinued due to suspected bleomycin toxicity   04/25/2015 Imaging US venous Doppller left upper extremity showed acute DVT noted in left internal jugular vein. All other veins appear thrombus free   04/27/2015 Surgery He had port removal.   05/04/2015 Imaging PET CT scan showed no evidence of disease.    INTERVAL HISTORY: Please see below for problem oriented charting. He returns for further follow-up. Denies recent infection. No residual neuropathy. No new lymphadenopathy. He complained of discomfort in the left upper extremity from recent DVT  REVIEW OF SYSTEMS:   Constitutional: Denies fevers, chills or abnormal weight loss Eyes: Denies blurriness of vision Ears, nose, mouth, throat, and face: Denies mucositis or sore throat Respiratory: Denies cough,  dyspnea or wheezes Cardiovascular: Denies palpitation, chest discomfort or lower extremity swelling Gastrointestinal:  Denies nausea, heartburn or change in bowel habits Skin: Denies abnormal skin rashes Lymphatics: Denies new lymphadenopathy or easy bruising Neurological:Denies numbness, tingling or new weaknesses Behavioral/Psych: Mood is stable, no new changes  All other systems were reviewed with the patient and are negative.  I have reviewed the past medical history, past surgical history, social history and family history with the patient and they are unchanged from previous note.  ALLERGIES:  has No Known Allergies.  MEDICATIONS:  No current outpatient prescriptions on file.   No current facility-administered medications for this visit.    PHYSICAL EXAMINATION: ECOG PERFORMANCE STATUS: 0 - Asymptomatic  Filed Vitals:   08/13/15 0912  BP: 113/58  Pulse: 57  Temp: 98 F (36.7 C)  Resp: 18   Filed Weights   08/13/15 0912  Weight: 192 lb 9.6 oz (87.363 kg)    GENERAL:alert, no distress and comfortable SKIN: skin color, texture, turgor are normal, no rashes or significant lesions EYES: normal, Conjunctiva are pink and non-injected, sclera clear OROPHARYNX:no exudate, no erythema and lips, buccal mucosa, and tongue normal  NECK: supple, thyroid normal size, non-tender, without nodularity LYMPH:  no palpable lymphadenopathy in the cervical, axillary or inguinal LUNGS: clear to auscultation and percussion with normal breathing effort HEART: regular rate & rhythm and no murmurs and no lower extremity edema ABDOMEN:abdomen soft, non-tender and normal bowel sounds Musculoskeletal:no cyanosis of digits and no clubbing  NEURO: alert & oriented x 3 with fluent speech, no focal motor/sensory deficits  LABORATORY DATA:  I have reviewed the data as  listed    Component Value Date/Time   NA 136 06/23/2015 1017   NA 140 04/14/2015 0901   K 4.1 06/23/2015 1017   K 4.2  04/14/2015 0901   CL 100 06/23/2015 1017   CO2 28 06/23/2015 1017   CO2 26 04/14/2015 0901   GLUCOSE 87 06/23/2015 1017   GLUCOSE 101 04/14/2015 0901   BUN 13 06/23/2015 1017   BUN 12.6 04/14/2015 0901   CREATININE 1.23 06/23/2015 1017   CREATININE 1.05 04/25/2015 1006   CREATININE 1.0 04/14/2015 0901   CALCIUM 9.0 06/23/2015 1017   CALCIUM 9.7 04/14/2015 0901   PROT 6.6 06/23/2015 1017   PROT 6.7 04/14/2015 0901   ALBUMIN 4.2 06/23/2015 1017   ALBUMIN 4.0 04/14/2015 0901   AST 23 06/23/2015 1017   AST 31 04/14/2015 0901   ALT 23 06/23/2015 1017   ALT 34 04/14/2015 0901   ALKPHOS 73 06/23/2015 1017   ALKPHOS 74 04/14/2015 0901   BILITOT 1.4* 06/23/2015 1017   BILITOT 1.30* 04/14/2015 0901   GFRNONAA 69 06/23/2015 1017   GFRNONAA >60 04/25/2015 1006   GFRAA 80 06/23/2015 1017   GFRAA >60 04/25/2015 1006    No results found for: SPEP, UPEP  Lab Results  Component Value Date   WBC 5.3 08/13/2015   NEUTROABS 3.1 08/13/2015   HGB 14.6 08/13/2015   HCT 43.3 08/13/2015   MCV 91.0 08/13/2015   PLT 161 08/13/2015      Chemistry      Component Value Date/Time   NA 136 06/23/2015 1017   NA 140 04/14/2015 0901   K 4.1 06/23/2015 1017   K 4.2 04/14/2015 0901   CL 100 06/23/2015 1017   CO2 28 06/23/2015 1017   CO2 26 04/14/2015 0901   BUN 13 06/23/2015 1017   BUN 12.6 04/14/2015 0901   CREATININE 1.23 06/23/2015 1017   CREATININE 1.05 04/25/2015 1006   CREATININE 1.0 04/14/2015 0901      Component Value Date/Time   CALCIUM 9.0 06/23/2015 1017   CALCIUM 9.7 04/14/2015 0901   ALKPHOS 73 06/23/2015 1017   ALKPHOS 74 04/14/2015 0901   AST 23 06/23/2015 1017   AST 31 04/14/2015 0901   ALT 23 06/23/2015 1017   ALT 34 04/14/2015 0901   BILITOT 1.4* 06/23/2015 1017   BILITOT 1.30* 04/14/2015 0901      ASSESSMENT & PLAN:  History of hodgkin's lymphoma His last PET CT scan show near complete response with residual lymphadenopathy but no SUV activity. I recommend  repeat blood work history and examination in 3 months and repeat imaging in 3 months. We discussed cancer survivorship issues and I recommend he makes an appointment to see his primary care doctor to resume routine surveillance program. We discussed vitamin D supplement  Neuropathy due to chemotherapeutic drug This has resolved.  Personal history of DVT (deep vein thrombosis) He complained of left upper extremity discomfort. He does not bother him. There is no further workup or treatment necessary.   Orders Placed This Encounter  Procedures  . NM PET Image Restag (PS) Skull Base To Thigh    Standing Status: Future     Number of Occurrences:      Standing Expiration Date: 10/12/2016    Order Specific Question:  Reason for Exam (SYMPTOM  OR DIAGNOSIS REQUIRED)    Answer:  hodgkin lymphoma exclude recurrence    Order Specific Question:  Preferred imaging location?    Answer:  [facility]  . CBC with Differential/Platelet  Standing Status: Future     Number of Occurrences:      Standing Expiration Date: 10/12/2016  . Comprehensive metabolic panel    Standing Status: Future     Number of Occurrences:      Standing Expiration Date: 10/12/2016   All questions were answered. The patient knows to call the clinic with any problems, questions or concerns. No barriers to learning was detected. I spent 15 minutes counseling the patient face to face. The total time spent in the appointment was 20 minutes and more than 50% was on counseling and review of test results     [facility], [place], MD 08/13/2015 9:55 AM

## 2015-08-13 NOTE — Assessment & Plan Note (Addendum)
His last PET CT scan show near complete response with residual lymphadenopathy but no SUV activity. I recommend repeat blood work history and examination in 3 months and repeat imaging in 3 months. We discussed cancer survivorship issues and I recommend he makes an appointment to see his primary care doctor to resume routine surveillance program. We discussed vitamin D supplement

## 2015-08-13 NOTE — Assessment & Plan Note (Signed)
This has resolved.

## 2015-08-23 ENCOUNTER — Other Ambulatory Visit: Payer: 59

## 2015-08-23 DIAGNOSIS — Z0000 Encounter for general adult medical examination without abnormal findings: Secondary | ICD-10-CM

## 2015-08-23 LAB — CBC WITH DIFFERENTIAL/PLATELET
BASOS PCT: 0 % (ref 0–1)
Basophils Absolute: 0 10*3/uL (ref 0.0–0.1)
Eosinophils Absolute: 0.2 10*3/uL (ref 0.0–0.7)
Eosinophils Relative: 3 % (ref 0–5)
HCT: 46.5 % (ref 39.0–52.0)
Hemoglobin: 15.3 g/dL (ref 13.0–17.0)
Lymphocytes Relative: 25 % (ref 12–46)
Lymphs Abs: 1.6 10*3/uL (ref 0.7–4.0)
MCH: 30.2 pg (ref 26.0–34.0)
MCHC: 32.9 g/dL (ref 30.0–36.0)
MCV: 91.7 fL (ref 78.0–100.0)
MPV: 11 fL (ref 8.6–12.4)
Monocytes Absolute: 0.4 10*3/uL (ref 0.1–1.0)
Monocytes Relative: 6 % (ref 3–12)
NEUTROS PCT: 66 % (ref 43–77)
Neutro Abs: 4.2 10*3/uL (ref 1.7–7.7)
PLATELETS: 166 10*3/uL (ref 150–400)
RBC: 5.07 MIL/uL (ref 4.22–5.81)
RDW: 13.7 % (ref 11.5–15.5)
WBC: 6.3 10*3/uL (ref 4.0–10.5)

## 2015-08-23 LAB — COMPREHENSIVE METABOLIC PANEL
ALT: 17 U/L (ref 9–46)
AST: 22 U/L (ref 10–40)
Albumin: 4.2 g/dL (ref 3.6–5.1)
Alkaline Phosphatase: 62 U/L (ref 40–115)
BUN: 15 mg/dL (ref 7–25)
CHLORIDE: 103 mmol/L (ref 98–110)
CO2: 27 mmol/L (ref 20–31)
CREATININE: 0.98 mg/dL (ref 0.60–1.35)
Calcium: 9.4 mg/dL (ref 8.6–10.3)
GLUCOSE: 75 mg/dL (ref 70–99)
Potassium: 4.6 mmol/L (ref 3.5–5.3)
SODIUM: 140 mmol/L (ref 135–146)
TOTAL PROTEIN: 6.7 g/dL (ref 6.1–8.1)
Total Bilirubin: 1.5 mg/dL — ABNORMAL HIGH (ref 0.2–1.2)

## 2015-08-23 LAB — LIPID PANEL
Cholesterol: 234 mg/dL — ABNORMAL HIGH (ref 125–200)
HDL: 49 mg/dL (ref >=40)
LDL Cholesterol: 166 mg/dL — ABNORMAL HIGH (ref <130)
Total CHOL/HDL Ratio: 4.8 Ratio (ref <=5.0)
Triglycerides: 93 mg/dL (ref <150)
VLDL: 19 mg/dL (ref <30)

## 2015-08-25 ENCOUNTER — Ambulatory Visit (INDEPENDENT_AMBULATORY_CARE_PROVIDER_SITE_OTHER): Payer: 59 | Admitting: Family Medicine

## 2015-08-25 ENCOUNTER — Encounter: Payer: Self-pay | Admitting: Family Medicine

## 2015-08-25 VITALS — BP 110/60 | HR 78 | Temp 98.7°F | Resp 14 | Ht 70.0 in | Wt 197.0 lb

## 2015-08-25 DIAGNOSIS — Z23 Encounter for immunization: Secondary | ICD-10-CM | POA: Diagnosis not present

## 2015-08-25 DIAGNOSIS — E785 Hyperlipidemia, unspecified: Secondary | ICD-10-CM | POA: Diagnosis not present

## 2015-08-25 DIAGNOSIS — Z0000 Encounter for general adult medical examination without abnormal findings: Secondary | ICD-10-CM | POA: Diagnosis not present

## 2015-08-25 MED ORDER — ATORVASTATIN CALCIUM 20 MG PO TABS: 20.0000 mg | ORAL_TABLET | Freq: Every day | ORAL | Status: DC

## 2015-08-25 NOTE — Progress Notes (Signed)
Subjective:    Patient ID: Keith Forbes, male    DOB: [birth year], 49 y.o.   MRN: [id number]  HPI  It is really good to see the patient again. This time last year he had just been diagnosed with Hodgkin's disease. Thankfully he has completed 6 months of ABVD therapy and is Hodgkin's disease is in remission. His last PET scan showed no increase metabolic activity in any lymph nodes.  He is following up every 3 months with his oncologist, Dr. Alvy Bimler.  Treatment was complicated by a DVT in his neck and upper left arm near his Port-A-Cath site. This was treated with 3 months of xarelto and immediate removal of the Port-A-Cath by surgery.  Overall he is doing very well. He is already had his flu shot in October. His most recent lab work is listed below  Lab on 08/23/2015  Component Date Value Ref Range Status  . WBC 08/23/2015 6.3  4.0 - 10.5 K/uL Final  . RBC 08/23/2015 5.07  4.22 - 5.81 MIL/uL Final  . Hemoglobin 08/23/2015 15.3  13.0 - 17.0 g/dL Final  . HCT 08/23/2015 46.5  39.0 - 52.0 % Final  . MCV 08/23/2015 91.7  78.0 - 100.0 fL Final  . MCH 08/23/2015 30.2  26.0 - 34.0 pg Final  . MCHC 08/23/2015 32.9  30.0 - 36.0 g/dL Final  . RDW 08/23/2015 13.7  11.5 - 15.5 % Final  . Platelets 08/23/2015 166  150 - 400 K/uL Final  . MPV 08/23/2015 11.0  8.6 - 12.4 fL Final  . Neutrophils Relative % 08/23/2015 66  43 - 77 % Final  . Neutro Abs 08/23/2015 4.2  1.7 - 7.7 K/uL Final  . Lymphocytes Relative 08/23/2015 25  12 - 46 % Final  . Lymphs Abs 08/23/2015 1.6  0.7 - 4.0 K/uL Final  . Monocytes Relative 08/23/2015 6  3 - 12 % Final  . Monocytes Absolute 08/23/2015 0.4  0.1 - 1.0 K/uL Final  . Eosinophils Relative 08/23/2015 3  0 - 5 % Final  . Eosinophils Absolute 08/23/2015 0.2  0.0 - 0.7 K/uL Final  . Basophils Relative 08/23/2015 0  0 - 1 % Final  . Basophils Absolute 08/23/2015 0.0  0.0 - 0.1 K/uL Final  . Smear Review 08/23/2015 Criteria for review not met   Final  . Sodium 08/23/2015  140  135 - 146 mmol/L Final  . Potassium 08/23/2015 4.6  3.5 - 5.3 mmol/L Final  . Chloride 08/23/2015 103  98 - 110 mmol/L Final  . CO2 08/23/2015 27  20 - 31 mmol/L Final  . Glucose, Bld 08/23/2015 75  70 - 99 mg/dL Final  . BUN 08/23/2015 15  7 - 25 mg/dL Final  . Creat 08/23/2015 0.98  0.60 - 1.35 mg/dL Final  . Total Bilirubin 08/23/2015 1.5* 0.2 - 1.2 mg/dL Final  . Alkaline Phosphatase 08/23/2015 62  40 - 115 U/L Final  . AST 08/23/2015 22  10 - 40 U/L Final  . ALT 08/23/2015 17  9 - 46 U/L Final  . Total Protein 08/23/2015 6.7  6.1 - 8.1 g/dL Final  . Albumin 08/23/2015 4.2  3.6 - 5.1 g/dL Final  . Calcium 08/23/2015 9.4  8.6 - 10.3 mg/dL Final  . Cholesterol 08/23/2015 234* 125 - 200 mg/dL Final  . Triglycerides 08/23/2015 93  <150 mg/dL Final  . HDL 08/23/2015 49  >=40 mg/dL Final  . Total CHOL/HDL Ratio 08/23/2015 4.8  <=5.0 Ratio Final  .  VLDL 08/23/2015 19  <30 mg/dL Final  . LDL Cholesterol 08/23/2015 166* <130 mg/dL Final   Comment:   Total Cholesterol/HDL Ratio:CHD Risk                        Coronary Heart Disease Risk Table                                        Men       Women          1/2 Average Risk              3.4        3.3              Average Risk              5.0        4.4           2X Average Risk              9.6        7.1           3X Average Risk             23.4       11.0 Use the calculated Patient Ratio above and the CHD Risk table  to determine the patient's CHD Risk.   Appointment on 08/13/2015  Component Date Value Ref Range Status  . WBC 08/13/2015 5.3  4.0 - 10.3 10e3/uL Final  . NEUT# 08/13/2015 3.1  1.5 - 6.5 10e3/uL Final  . HGB 08/13/2015 14.6  13.0 - 17.1 g/dL Final  . HCT 08/13/2015 43.3  38.4 - 49.9 % Final  . Platelets 08/13/2015 161  140 - 400 10e3/uL Final  . MCV 08/13/2015 91.0  79.3 - 98.0 fL Final  . MCH 08/13/2015 30.7  27.2 - 33.4 pg Final  . MCHC 08/13/2015 33.7  32.0 - 36.0 g/dL Final  . RBC 08/13/2015 4.76  4.20 -  5.82 10e6/uL Final  . RDW 08/13/2015 13.2  11.0 - 14.6 % Final  . lymph# 08/13/2015 1.6  0.9 - 3.3 10e3/uL Final  . MONO# 08/13/2015 0.4  0.1 - 0.9 10e3/uL Final  . Eosinophils Absolute 08/13/2015 0.2  0.0 - 0.5 10e3/uL Final  . Basophils Absolute 08/13/2015 0.1  0.0 - 0.1 10e3/uL Final  . NEUT% 08/13/2015 57.7  39.0 - 75.0 % Final  . LYMPH% 08/13/2015 30.5  14.0 - 49.0 % Final  . MONO% 08/13/2015 7.5  0.0 - 14.0 % Final  . EOS% 08/13/2015 3.2  0.0 - 7.0 % Final  . BASO% 08/13/2015 1.1  0.0 - 2.0 % Final  . Sodium 08/13/2015 139  136 - 145 mEq/L Final  . Potassium 08/13/2015 4.5  3.5 - 5.1 mEq/L Final  . Chloride 08/13/2015 106  98 - 109 mEq/L Final  . CO2 08/13/2015 25  22 - 29 mEq/L Final  . Glucose 08/13/2015 85  70 - 140 mg/dl Final   Glucose reference range is for nonfasting patients. Fasting glucose reference range is 70- 100.  Marland Kitchen BUN 08/13/2015 15.6  7.0 - 26.0 mg/dL Final  . Creatinine 08/13/2015 1.1  0.7 - 1.3 mg/dL Final  . Total Bilirubin 08/13/2015 1.61* 0.20 - 1.20 mg/dL Final  . Alkaline Phosphatase 08/13/2015 68  40 - 150 U/L Final  . AST  08/13/2015 22  5 - 34 U/L Final  . ALT 08/13/2015 18  0 - 55 U/L Final  . Total Protein 08/13/2015 6.7  6.4 - 8.3 g/dL Final  . Albumin 08/13/2015 4.0  3.5 - 5.0 g/dL Final  . Calcium 08/13/2015 9.5  8.4 - 10.4 mg/dL Final  . Anion Gap 08/13/2015 9  3 - 11 mEq/L Final  . EGFR 08/13/2015 79* >90 ml/min/1.73 m2 Final   eGFR is calculated using the CKD-EPI Creatinine Equation (2009)   Past Medical History  Diagnosis Date  . Hernia   . Hyperlipidemia   . Hypercholesteremia   . Pars defect of lumbar spine     bilateral 2013 (Dr. Sherwood Gambler)  . GERD (gastroesophageal reflux disease)   . Wears contact lenses   . Cancer [facility])     hodgkin lymphoma  . Hodgkin lymphoma ([place]) 09/11/2014  . Lymphoma ([place])   . Bone lesion 05/05/2015   Past Surgical History  Procedure Laterality Date  . Lymph node biopsy  12/15  . Portacath placement Left  09/15/2014    Procedure: INSERTION PORT-A-CATH WITH ULTRA SOUND;  Surgeon: Fanny Skates, MD;  Location: [place];  Service: General;  Laterality: Left;  . Port-a-cath removal N/A 04/27/2015    Procedure: REMOVAL PORT-A-CATH;  Surgeon: Stark Klein, MD;  Location: [facility];  Service: General;  Laterality: N/A;   No current outpatient prescriptions on file prior to visit.   No current facility-administered medications on file prior to visit.   No Known Allergies Social History   Social History  . Marital Status: Married    Spouse Name: N/A  . Number of Children: N/A  . Years of Education: N/A   Occupational History  . Not on file.   Social History Main Topics  . Smoking status: Never Smoker   . Smokeless tobacco: Never Used  . Alcohol Use: Yes  . Drug Use: No  . Sexual Activity: Not on file     Comment: married, works in emergency services   Other Topics Concern  . Not on file   Social History Narrative   Family History  Problem Relation Age of Onset  . Hyperlipidemia Father   . Heart disease Father   . Cancer Paternal Aunt     breast ca   his father passed away in 06-29-23 from pulmonary fibrosis   Review of Systems  All other systems reviewed and are negative.      Objective:   Physical Exam  Constitutional: He is oriented to person, place, and time. He appears well-developed and well-nourished. No distress.  HENT:  Head: Normocephalic and atraumatic.  Right Ear: External ear normal.  Left Ear: External ear normal.  Nose: Nose normal.  Mouth/Throat: Oropharynx is clear and moist. No oropharyngeal exudate.  Eyes: Conjunctivae and EOM are normal. Pupils are equal, round, and reactive to light. Right eye exhibits no discharge. Left eye exhibits no discharge. No scleral icterus.  Neck: Normal range of motion. Neck supple. No JVD present. No tracheal deviation present. No thyromegaly present.  Cardiovascular: Normal rate, regular rhythm, normal  heart sounds and intact distal pulses.  Exam reveals no gallop and no friction rub.   No murmur heard. Pulmonary/Chest: Effort normal and breath sounds normal. No stridor. No respiratory distress. He has no wheezes. He has no rales. He exhibits no tenderness.  Abdominal: Soft. Bowel sounds are normal. He exhibits no distension and no mass. There is no tenderness. There is no rebound and no guarding.  Hernia confirmed negative in the right inguinal area and confirmed negative in the left inguinal area.  Genitourinary: Testes normal and penis normal.  Musculoskeletal: Normal range of motion. He exhibits no edema or tenderness.  Lymphadenopathy:    He has no cervical adenopathy.       Right: No inguinal adenopathy present.       Left: No inguinal adenopathy present.  Neurological: He is alert and oriented to person, place, and time. He has normal reflexes. He displays normal reflexes. No cranial nerve deficit. He exhibits normal muscle tone. Coordination normal.  Skin: Skin is warm. No rash noted. He is not diaphoretic. No erythema. No pallor.  Psychiatric: He has a normal mood and affect. His behavior is normal. Judgment and thought content normal.  Vitals reviewed.         Assessment & Plan:  HLD (hyperlipidemia) - Plan: atorvastatin (LIPITOR) 20 MG tablet  Routine general medical examination at a health care facility physical exam is normal.  Labs are significant for LDL of 166.  I will start the patient on Lipitor 20 mg poqday and recheck FLP, CMP in 3 months.  Otherwise cancer screening and immunizations are up to date.  I did give the patient pneumovax 23 today given his recent battle with Hodgkins disease and chemo.

## 2015-08-31 NOTE — Addendum Note (Signed)
Addended by: Shary Decamp B on: 08/31/2015 09:47 AM   Modules accepted: Orders

## 2015-11-10 ENCOUNTER — Ambulatory Visit (INDEPENDENT_AMBULATORY_CARE_PROVIDER_SITE_OTHER): Payer: 59 | Admitting: Family Medicine

## 2015-11-10 ENCOUNTER — Encounter: Payer: Self-pay | Admitting: Family Medicine

## 2015-11-10 VITALS — BP 120/70 | HR 74 | Temp 98.7°F | Resp 14 | Ht 70.0 in | Wt 198.0 lb

## 2015-11-10 DIAGNOSIS — J0100 Acute maxillary sinusitis, unspecified: Secondary | ICD-10-CM

## 2015-11-10 MED ORDER — AMOXICILLIN 875 MG PO TABS: 875.0000 mg | ORAL_TABLET | Freq: Two times a day (BID) | ORAL | Status: DC

## 2015-11-10 NOTE — Progress Notes (Signed)
Patient ID: Keith Forbes, male   DOB: 1965/10/08, 50 y.o.   MRN: [id number]    Subjective:    Patient ID: Keith Forbes, male    DOB: 1966/09/12, 50 y.o.   MRN: [id number]  Patient presents for Sinus pressure and HA's  patient here with sinus pressure drainage,  Headache in the frontal area impression at the nasal bridge. He is using decongestion as well as Mucinex with minimal improvement. He does have history of non-Hodgkin's lymphoma he is currently in remission but is been advised to seek care for any illness. He is scheduled to have a PET scan next week. He has not had any fever and her nausea vomiting no cough   Review Of Systems:  GEN- denies fatigue, fever, weight loss,weakness, recent illness HEENT- denies eye drainage, change in vision,+ nasal discharge, CVS- denies chest pain, palpitations RESP- denies SOB, cough, wheeze ABD- denies N/V, change in stools, abd pain Neuro- denies headache, dizziness, syncope, seizure activity       Objective:    BP 120/70 mmHg  Pulse 74  Temp(Src) 98.7 F (37.1 C) (Oral)  Resp 14  Ht 5\' 10"  (1.778 m)  Wt 198 lb (89.812 kg)  BMI 28.41 kg/m2 GEN- NAD, alert and oriented x3 HEENT- PERRL, EOMI, non injected sclera, pink conjunctiva, MMM, oropharynx clear, TM clear bilat no effusion,  + maxillary sinus tenderness, inflammed turbinates,  Nasal drainage  Neck- Supple, no LAD CVS- RRR, no murmur RESP-CTAB Pulses- Radial 2+         Assessment & Plan:      Problem List Items Addressed This Visit    None    Visit Diagnoses    Acute maxillary sinusitis, recurrence not specified    -  Primary    early sinusitis, continue decongestnat for another 1-2 days, nasal saline , mucinex, Amox given for 1 week    Relevant Medications    amoxicillin (AMOXIL) 875 MG tablet       Note: This dictation was prepared with Dragon dictation along with smaller phrase technology. Any transcriptional errors that result from this process are  unintentional.

## 2015-11-10 NOTE — Patient Instructions (Signed)
Take antibiotics  Continue mucinex, add nasal saline  Continue advil as prescribed  F/U as needed

## 2015-11-15 ENCOUNTER — Ambulatory Visit (HOSPITAL_COMMUNITY)
Admission: RE | Admit: 2015-11-15 | Discharge: 2015-11-15 | Disposition: A | Discharge status: Home / Self Care | Payer: 59 | Source: Ambulatory Visit | Attending: Hematology and Oncology | Admitting: Hematology and Oncology

## 2015-11-15 ENCOUNTER — Other Ambulatory Visit (HOSPITAL_BASED_OUTPATIENT_CLINIC_OR_DEPARTMENT_OTHER): Payer: 59

## 2015-11-15 ENCOUNTER — Encounter (HOSPITAL_COMMUNITY): Payer: Self-pay

## 2015-11-15 DIAGNOSIS — R599 Enlarged lymph nodes, unspecified: Secondary | ICD-10-CM | POA: Diagnosis not present

## 2015-11-15 DIAGNOSIS — Z8571 Personal history of Hodgkin lymphoma: Secondary | ICD-10-CM | POA: Diagnosis not present

## 2015-11-15 LAB — CBC WITH DIFFERENTIAL/PLATELET
BASO%: 0.7 % (ref 0.0–2.0)
Basophils Absolute: 0 10*3/uL (ref 0.0–0.1)
EOS%: 3.5 % (ref 0.0–7.0)
Eosinophils Absolute: 0.2 10*3/uL (ref 0.0–0.5)
HCT: 44.9 % (ref 38.4–49.9)
HEMOGLOBIN: 15 g/dL (ref 13.0–17.1)
LYMPH#: 1.8 10*3/uL (ref 0.9–3.3)
LYMPH%: 26.7 % (ref 14.0–49.0)
MCH: 30 pg (ref 27.2–33.4)
MCHC: 33.4 g/dL (ref 32.0–36.0)
MCV: 89.9 fL (ref 79.3–98.0)
MONO#: 0.5 10*3/uL (ref 0.1–0.9)
MONO%: 6.9 % (ref 0.0–14.0)
NEUT%: 62.2 % (ref 39.0–75.0)
NEUTROS ABS: 4.1 10*3/uL (ref 1.5–6.5)
Platelets: 140 10*3/uL (ref 140–400)
RBC: 5 10*6/uL (ref 4.20–5.82)
RDW: 12.5 % (ref 11.0–14.6)
WBC: 6.6 10*3/uL (ref 4.0–10.3)

## 2015-11-15 LAB — GLUCOSE, CAPILLARY: Glucose-Capillary: 78 mg/dL (ref 65–99)

## 2015-11-15 LAB — COMPREHENSIVE METABOLIC PANEL
ALBUMIN: 4.1 g/dL (ref 3.5–5.0)
ALK PHOS: 71 U/L (ref 40–150)
ALT: 22 U/L (ref 0–55)
AST: 23 U/L (ref 5–34)
Anion Gap: 8 mEq/L (ref 3–11)
BILIRUBIN TOTAL: 1.29 mg/dL — AB (ref 0.20–1.20)
BUN: 17.9 mg/dL (ref 7.0–26.0)
CO2: 28 mEq/L (ref 22–29)
Calcium: 9.6 mg/dL (ref 8.4–10.4)
Chloride: 106 mEq/L (ref 98–109)
Creatinine: 1.2 mg/dL (ref 0.7–1.3)
EGFR: 73 mL/min/{1.73_m2} — ABNORMAL LOW (ref >90)
GLUCOSE: 89 mg/dL (ref 70–140)
Potassium: 4.4 mEq/L (ref 3.5–5.1)
SODIUM: 142 meq/L (ref 136–145)
TOTAL PROTEIN: 7.2 g/dL (ref 6.4–8.3)

## 2015-11-15 MED ORDER — FLUDEOXYGLUCOSE F - 18 (FDG) INJECTION
9.9000 | Freq: Once | INTRAVENOUS | Status: AC | PRN
  Administered 2015-11-15: 9.9 via INTRAVENOUS

## 2015-11-16 ENCOUNTER — Ambulatory Visit (HOSPITAL_BASED_OUTPATIENT_CLINIC_OR_DEPARTMENT_OTHER): Payer: 59 | Admitting: Hematology and Oncology

## 2015-11-16 ENCOUNTER — Encounter: Payer: Self-pay | Admitting: Hematology and Oncology

## 2015-11-16 ENCOUNTER — Telehealth: Payer: Self-pay | Admitting: Hematology and Oncology

## 2015-11-16 VITALS — BP 113/60 | HR 62 | Temp 97.7°F | Resp 18 | Ht 70.0 in | Wt 198.8 lb

## 2015-11-16 DIAGNOSIS — R1031 Right lower quadrant pain: Secondary | ICD-10-CM

## 2015-11-16 DIAGNOSIS — R413 Other amnesia: Secondary | ICD-10-CM | POA: Diagnosis not present

## 2015-11-16 DIAGNOSIS — Z8571 Personal history of Hodgkin lymphoma: Secondary | ICD-10-CM | POA: Diagnosis not present

## 2015-11-16 DIAGNOSIS — R109 Unspecified abdominal pain: Secondary | ICD-10-CM | POA: Insufficient documentation

## 2015-11-16 NOTE — Assessment & Plan Note (Signed)
His last PET CT scan minimal residual lymphadenopathy but no SUV activity. I recommend repeat blood work history and examination in 6 months and repeat imaging in 6 months. We discussed cancer survivorship issues and I recommend he makes an appointment to see his primary care doctor to resume routine surveillance program.

## 2015-11-16 NOTE — Assessment & Plan Note (Signed)
The patient stated he has poor memory since chemotherapy which is an expected side effects of treatment. We discussed strategies to help him cope with poor short term memory.  as of this point in time, there is no indication for him to be on any treatment.

## 2015-11-16 NOTE — Progress Notes (Signed)
[place] OFFICE PROGRESS NOTE  Patient Care Team: [name], MD as PCP - General (Family Medicine)  SUMMARY OF ONCOLOGIC HISTORY: Oncology History   Hodgkin lymphoma   Staging form: Lymphoid Neoplasms, AJCC 6th Edition     Clinical stage from 09/13/2014: Stage III - Signed by [name], MD on 09/13/2014       History of hodgkin's lymphoma   09/04/2014 Pathology Results Accession: [id number] inguinal lymph node biopsy confirmed Hodgkin lymphoma   09/04/2014 Procedure Dr. Dalbert Batman performed excision of left inguinal lymph node biopsy   09/11/2014 Imaging CT scan of the chest, abdomen and pelvis show disease both above and below the diaphragm   09/16/2014 Imaging Echocardiogram is negative   09/18/2014 Bone Marrow Biopsy Bone marrow aspirate and biopsy is negative   09/22/2014 - 03/03/2015 Chemotherapy Patient completed 6 cycles of ABVD. Bleomycin was discontinued in March 2016   10/06/2014 Adverse Reaction  treatment was delayed due to severe leukopenia. Future dose modification was made for abnormal liver function tests, leukopenia and neuropathy   11/23/2014 Imaging PET CT scan showed near complete response to Rx   12/23/2014 Adverse Reaction Bleomycin was discontinued due to suspected bleomycin toxicity   04/25/2015 Imaging US venous Doppller left upper extremity showed acute DVT noted in left internal jugular vein. All other veins appear thrombus free   04/27/2015 Surgery He had port removal.   05/04/2015 Imaging PET CT scan showed no evidence of disease.   11/15/2015 Imaging Stable mild right internal mammary lymphadenopathy with mild hypermetabolic activity, Deauville score 2.    INTERVAL HISTORY: Please see below for problem oriented charting.  he feels well apart from short-term memory. He complained of intermittent right lower quadrant discomfort that comes and goes, not associated with any nausea or change in bowel habits. He had recent upper respiratory tract  infection, resolved after antibiotic therapy.  REVIEW OF SYSTEMS:   Constitutional: Denies fevers, chills or abnormal weight loss Eyes: Denies blurriness of vision Ears, nose, mouth, throat, and face: Denies mucositis or sore throat Respiratory: Denies cough, dyspnea or wheezes Cardiovascular: Denies palpitation, chest discomfort or lower extremity swelling Gastrointestinal:  Denies nausea, heartburn or change in bowel habits Skin: Denies abnormal skin rashes Lymphatics: Denies new lymphadenopathy or easy bruising Neurological:Denies numbness, tingling or new weaknesses Behavioral/Psych: Mood is stable, no new changes  All other systems were reviewed with the patient and are negative.  I have reviewed the past medical history, past surgical history, social history and family history with the patient and they are unchanged from previous note.  ALLERGIES:  has No Known Allergies.  MEDICATIONS:  Current Outpatient Prescriptions  Medication Sig Dispense Refill  . amoxicillin (AMOXIL) 875 MG tablet Take 1 tablet (875 mg total) by mouth 2 (two) times daily. 14 tablet 0  . atorvastatin (LIPITOR) 20 MG tablet Take 1 tablet (20 mg total) by mouth daily. 90 tablet 3  . cholecalciferol (VITAMIN D) 1000 units tablet Take 1,000 Units by mouth daily.     No current facility-administered medications for this visit.    PHYSICAL EXAMINATION: ECOG PERFORMANCE STATUS: 0 - Asymptomatic  Filed Vitals:   11/16/15 0913  BP: 113/60  Pulse: 62  Temp: 97.7 F (36.5 C)  Resp: 18   Filed Weights   11/16/15 0913  Weight: 198 lb 12.8 oz (90.175 kg)    GENERAL:alert, no distress and comfortable SKIN: skin color, texture, turgor are normal, no rashes or significant lesions EYES: normal, Conjunctiva are pink and  non-injected, sclera clear OROPHARYNX:no exudate, no erythema and lips, buccal mucosa, and tongue normal  NECK: supple, thyroid normal size, non-tender, without nodularity LYMPH:  no  palpable lymphadenopathy in the cervical, axillary or inguinal LUNGS: clear to auscultation and percussion with normal breathing effort HEART: regular rate & rhythm and no murmurs and no lower extremity edema ABDOMEN:abdomen soft, non-tender and normal bowel sounds Musculoskeletal:no cyanosis of digits and no clubbing  NEURO: alert & oriented x 3 with fluent speech, no focal motor/sensory deficits  LABORATORY DATA:  I have reviewed the data as listed    Component Value Date/Time   NA 142 11/15/2015 0759   NA 140 08/23/2015 0902   K 4.4 11/15/2015 0759   K 4.6 08/23/2015 0902   CL 103 08/23/2015 0902   CO2 28 11/15/2015 0759   CO2 27 08/23/2015 0902   GLUCOSE 89 11/15/2015 0759   GLUCOSE 75 08/23/2015 0902   BUN 17.9 11/15/2015 0759   BUN 15 08/23/2015 0902   CREATININE 1.2 11/15/2015 0759   CREATININE 0.98 08/23/2015 0902   CREATININE 1.05 04/25/2015 1006   CALCIUM 9.6 11/15/2015 0759   CALCIUM 9.4 08/23/2015 0902   PROT 7.2 11/15/2015 0759   PROT 6.7 08/23/2015 0902   ALBUMIN 4.1 11/15/2015 0759   ALBUMIN 4.2 08/23/2015 0902   AST 23 11/15/2015 0759   AST 22 08/23/2015 0902   ALT 22 11/15/2015 0759   ALT 17 08/23/2015 0902   ALKPHOS 71 11/15/2015 0759   ALKPHOS 62 08/23/2015 0902   BILITOT 1.29* 11/15/2015 0759   BILITOT 1.5* 08/23/2015 0902   GFRNONAA 69 06/23/2015 1017   GFRNONAA >60 04/25/2015 1006   GFRAA 80 06/23/2015 1017   GFRAA >60 04/25/2015 1006    No results found for: SPEP, UPEP  Lab Results  Component Value Date   WBC 6.6 11/15/2015   NEUTROABS 4.1 11/15/2015   HGB 15.0 11/15/2015   HCT 44.9 11/15/2015   MCV 89.9 11/15/2015   PLT 140 11/15/2015      Chemistry      Component Value Date/Time   NA 142 11/15/2015 0759   NA 140 08/23/2015 0902   K 4.4 11/15/2015 0759   K 4.6 08/23/2015 0902   CL 103 08/23/2015 0902   CO2 28 11/15/2015 0759   CO2 27 08/23/2015 0902   BUN 17.9 11/15/2015 0759   BUN 15 08/23/2015 0902   CREATININE 1.2  11/15/2015 0759   CREATININE 0.98 08/23/2015 0902   CREATININE 1.05 04/25/2015 1006      Component Value Date/Time   CALCIUM 9.6 11/15/2015 0759   CALCIUM 9.4 08/23/2015 0902   ALKPHOS 71 11/15/2015 0759   ALKPHOS 62 08/23/2015 0902   AST 23 11/15/2015 0759   AST 22 08/23/2015 0902   ALT 22 11/15/2015 0759   ALT 17 08/23/2015 0902   BILITOT 1.29* 11/15/2015 0759   BILITOT 1.5* 08/23/2015 0902       RADIOGRAPHIC STUDIES: I have personally reviewed the radiological images as listed and agreed with the findings in the report. Nm Pet Image Restag (ps) Skull Base To Thigh  11/15/2015  CLINICAL DATA:  Subsequent treatment strategy for Hodgkin's lymphoma. EXAM: NUCLEAR MEDICINE PET SKULL BASE TO THIGH TECHNIQUE: 9.9 mCi F-18 FDG was injected intravenously. Full-ring PET imaging was performed from the skull base to thigh after the radiotracer. CT data was obtained and used for attenuation correction and anatomic localization. FASTING BLOOD GLUCOSE:  Value: 78 mg/dl COMPARISON:  05/04/2015 FINDINGS: NECK No hypermetabolic lymph nodes  in the neck. CHEST Mild right internal mammary chain lymphadenopathy is unchanged in size, measuring 2.4 x 1.1 cm on image 70/series 4. This has SUV max of 2.3 on today's study compared to 1.8 previously. No other hypermetabolic lymphadenopathy identified within the thorax. No suspicious pulmonary nodules seen on CT. Mild tree-in-bud opacity again noted in the peripheral right upper lobe, which is unchanged. ABDOMEN/PELVIS No abnormal hypermetabolic activity within the liver, pancreas, adrenal glands, or spleen. No hypermetabolic lymph nodes in the abdomen or pelvis. SKELETON No focal hypermetabolic activity to suggest skeletal metastasis. 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 IMPRESSION: Stable mild right internal mammary lymphadenopathy with mild hypermetabolic activity, Deauville score 2. No new  or progressive disease identified. Electronically Signed   By: Earle Gell M.D.   On: 11/15/2015 11:07     ASSESSMENT & PLAN:  History of hodgkin's lymphoma His last PET CT scan minimal residual lymphadenopathy but no SUV activity. I recommend repeat blood work history and examination in 6 months and repeat imaging in 6 months. We discussed cancer survivorship issues and I recommend he makes an appointment to see his primary care doctor to resume routine surveillance program.    Abdominal pain  The abdominal pain is intermittent in nature, not related to activity or food. PET CT scan is negative. I recommend observation only. He would be due for colonoscopy next year  Poor memory  The patient stated he has poor memory since chemotherapy which is an expected side effects of treatment. We discussed strategies to help him cope with poor short term memory.  as of this point in time, there is no indication for him to be on any treatment.   Orders Placed This Encounter  Procedures  . CT Chest W Contrast    Standing Status: Future     Number of Occurrences:      Standing Expiration Date: 02/15/2017    Order Specific Question:  Reason for Exam (SYMPTOM  OR DIAGNOSIS REQUIRED)    Answer:  staging Hodgkin lymphoma, exclude recurrence    Order Specific Question:  Preferred imaging location?    Answer:  [facility]  . CT Abdomen Pelvis W Contrast    Standing Status: Future     Number of Occurrences:      Standing Expiration Date: 02/15/2017    Order Specific Question:  Reason for Exam (SYMPTOM  OR DIAGNOSIS REQUIRED)    Answer:  staging Hodgkin lymphoma, exclude recurrence    Order Specific Question:  Preferred imaging location?    Answer:  [facility]  . CBC with Differential/Platelet    Standing Status: Future     Number of Occurrences:      Standing Expiration Date: 02/15/2017  . Comprehensive metabolic panel    Standing Status: Future     Number of Occurrences:       Standing Expiration Date: 02/15/2017   All questions were answered. The patient knows to call the clinic with any problems, questions or concerns. No barriers to learning was detected. I spent 15 minutes counseling the patient face to face. The total time spent in the appointment was 20 minutes and more than 50% was on counseling and review of test results     [facility], Josalyn Dettmann, MD 11/16/2015 10:43 AM

## 2015-11-16 NOTE — Telephone Encounter (Signed)
per pof to sch pt appt-gave pt copy of avs-adv central sch willc all to sh trmt

## 2015-11-16 NOTE — Assessment & Plan Note (Signed)
The abdominal pain is intermittent in nature, not related to activity or food. PET CT scan is negative. I recommend observation only. He would be due for colonoscopy next year

## 2015-11-25 ENCOUNTER — Other Ambulatory Visit: Payer: 59

## 2015-11-25 DIAGNOSIS — Z79899 Other long term (current) drug therapy: Secondary | ICD-10-CM

## 2015-11-25 DIAGNOSIS — E785 Hyperlipidemia, unspecified: Secondary | ICD-10-CM

## 2015-11-25 LAB — COMPLETE METABOLIC PANEL WITH GFR
ALT: 20 U/L (ref 9–46)
AST: 26 U/L (ref 10–40)
Albumin: 4.2 g/dL (ref 3.6–5.1)
Alkaline Phosphatase: 54 U/L (ref 40–115)
BUN: 16 mg/dL (ref 7–25)
CHLORIDE: 104 mmol/L (ref 98–110)
CO2: 27 mmol/L (ref 20–31)
Calcium: 9.4 mg/dL (ref 8.6–10.3)
Creat: 1.14 mg/dL (ref 0.60–1.35)
GFR, EST AFRICAN AMERICAN: 87 mL/min (ref >=60)
GFR, EST NON AFRICAN AMERICAN: 75 mL/min (ref >=60)
Glucose, Bld: 78 mg/dL (ref 70–99)
Potassium: 4.5 mmol/L (ref 3.5–5.3)
Sodium: 140 mmol/L (ref 135–146)
Total Bilirubin: 1.4 mg/dL — ABNORMAL HIGH (ref 0.2–1.2)
Total Protein: 6.6 g/dL (ref 6.1–8.1)

## 2015-11-25 LAB — LIPID PANEL
Cholesterol: 165 mg/dL (ref 125–200)
HDL: 45 mg/dL (ref >=40)
LDL CALC: 97 mg/dL (ref <130)
TRIGLYCERIDES: 116 mg/dL (ref <150)
Total CHOL/HDL Ratio: 3.7 Ratio (ref <=5.0)
VLDL: 23 mg/dL (ref <30)

## 2015-11-26 ENCOUNTER — Encounter: Payer: Self-pay | Admitting: Family Medicine

## 2016-01-02 ENCOUNTER — Other Ambulatory Visit: Payer: Self-pay | Admitting: Family Medicine

## 2016-03-26 IMAGING — US US PELVIS LIMITED
1 series · 12 of 12 positions shown · non-contrast
Comparison: None.

CLINICAL DATA: Left inguinal lymphadenitis.

EXAM:
US PELVIS LIMITED
TECHNIQUE: Standard ultrasound was obtained of the inguinal region.

[Series 1: us pelvis limited · 0.08mm/px · 12 of 12 slices shown]
[im 1/12]
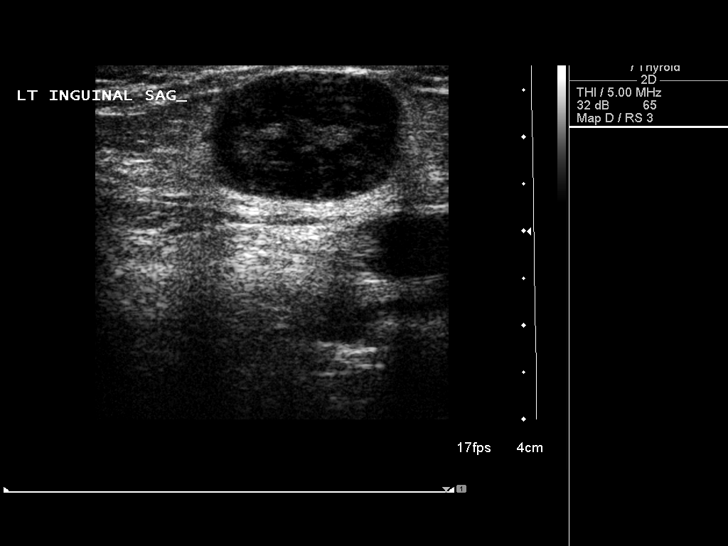
[im 2/12]
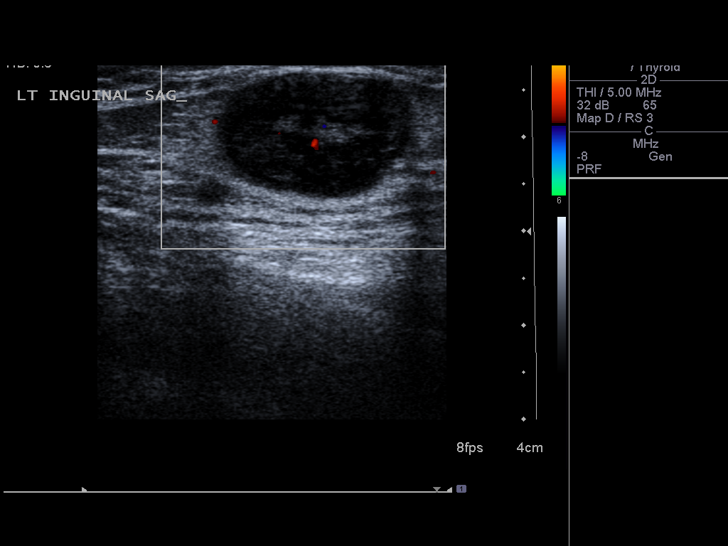
[im 3/12]
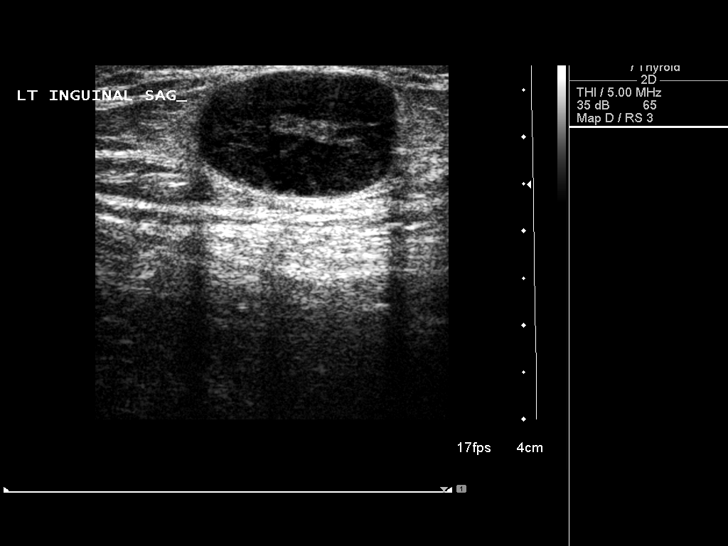
[im 4/12]
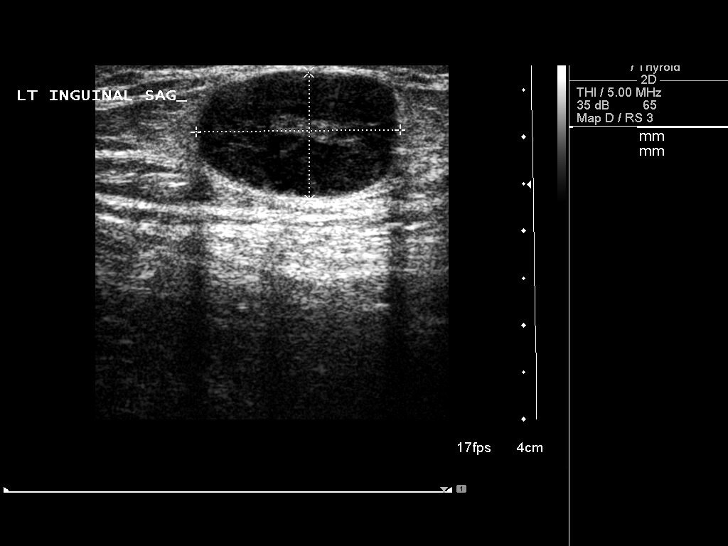
[im 5/12]
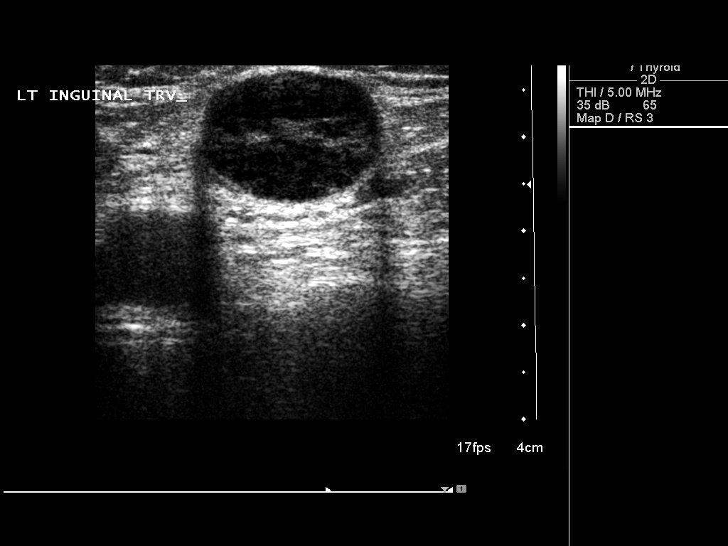
[im 6/12]
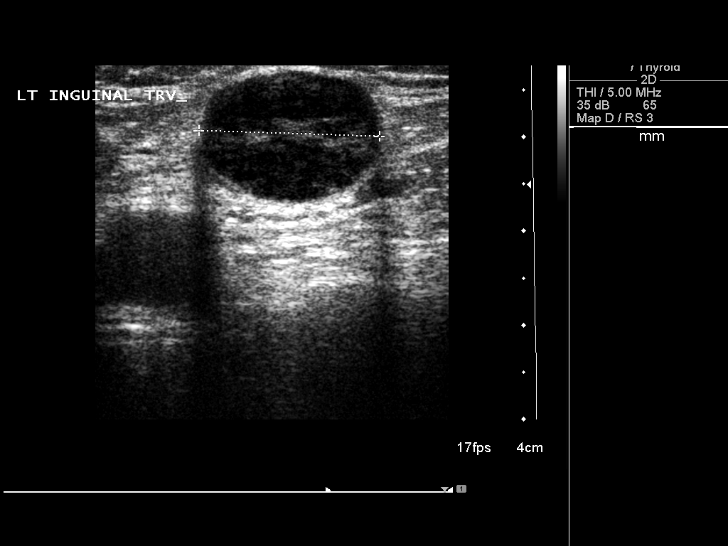
[im 7/12]
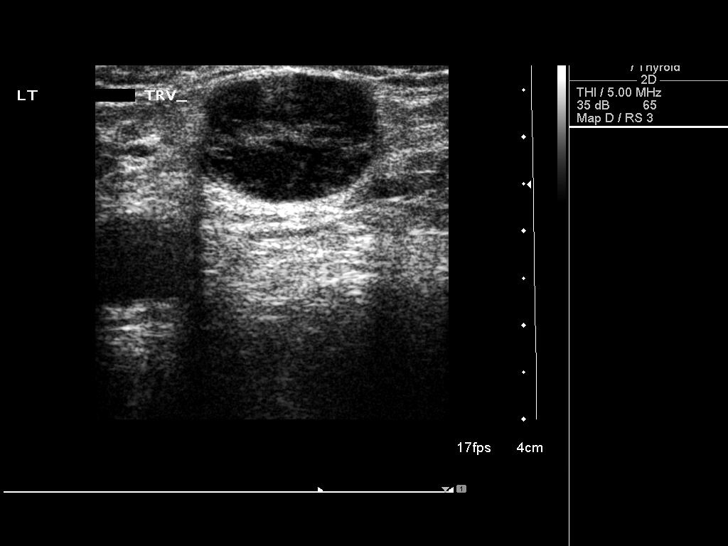
[im 8/12]
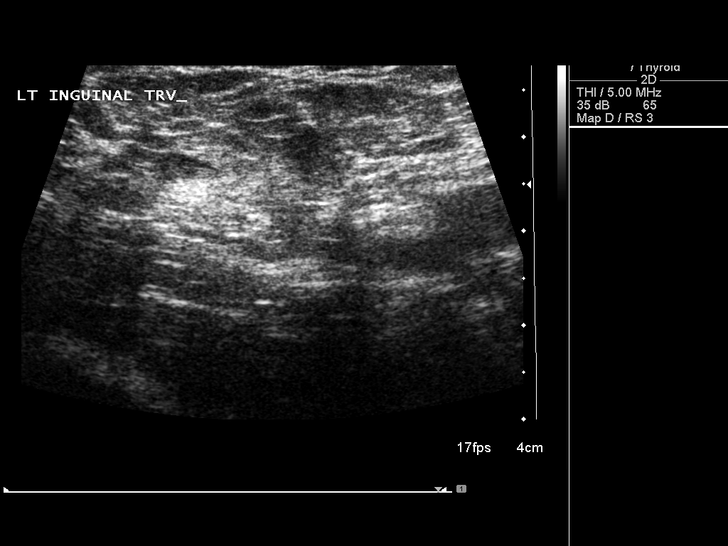
[im 9/12]
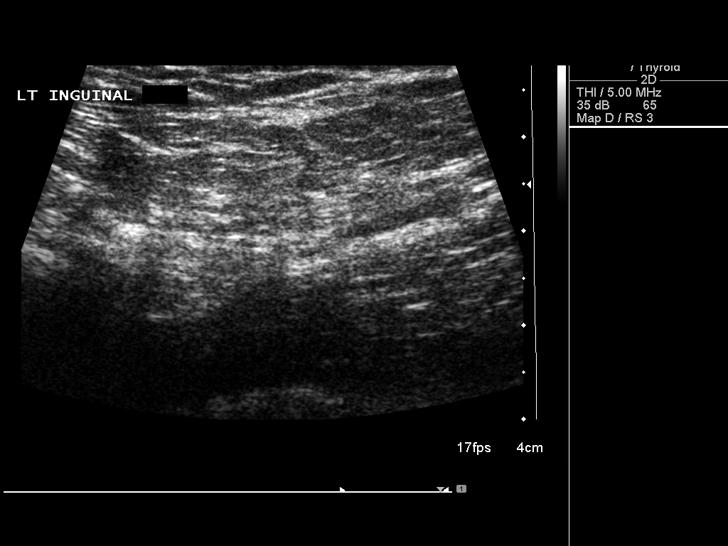
[im 10/12]
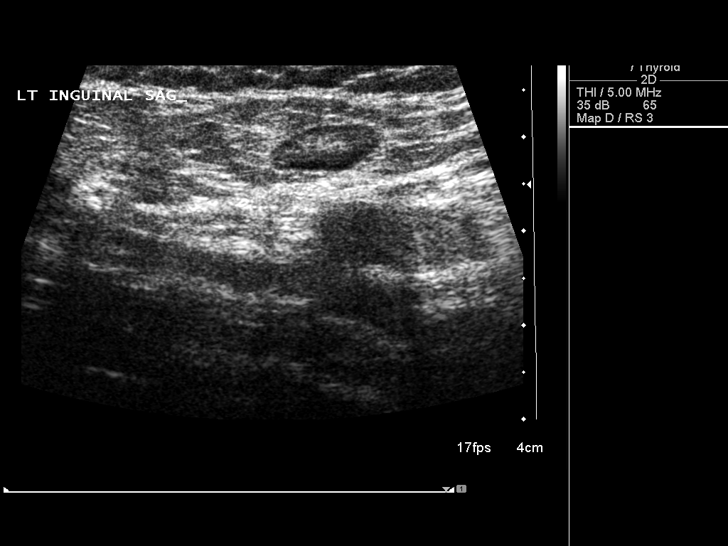
[im 11/12]
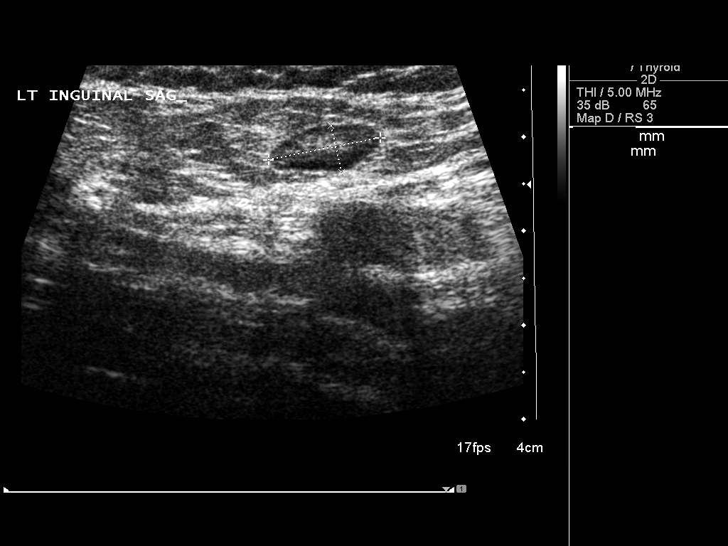
[im 12/12]
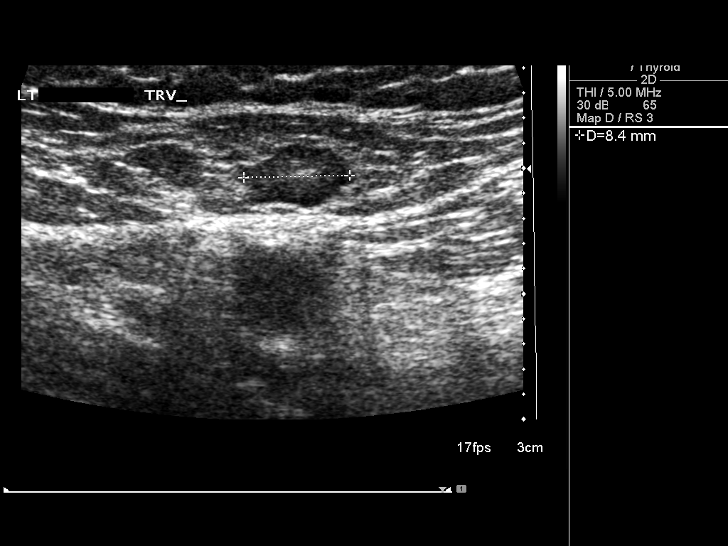

[12 of 12 positions shown; findings below may reference images not displayed]

FINDINGS: A 2.2 x 1.4 x 1.9 cm hypoechoic mass containing a central core fat
is noted. An adjacent 1.2 x 0.5 x 0.8 cm hypoechoic mass containing
a central core of fat noted. These findings are consistent with left
inguinal lymph nodes. No other abnormality identified.
IMPRESSION: Right inguinal lymphadenopathy. Largest lymph node measures up to
2.2 cm. This could be benign or malignant.

## 2016-04-27 IMAGING — CT CT CHEST W/ CM
2 of 5 series · 12 of 36 positions shown, 19 images · IV contrast (READICAT/WATER & [ID] OMNI 300)
Comparison: None.

CLINICAL DATA: Current history of Hodgkin's lymphoma.

EXAM:
CT CHEST, ABDOMEN, AND PELVIS WITH CONTRAST
TECHNIQUE: Multidetector CT imaging of the chest, abdomen and pelvis was
performed following the standard protocol during bolus
administration of intravenous contrast.
CONTRAST:  100mL OMNIPAQUE IOHEXOL 300 MG/ML  SOLN

[Series 601: coronal body · coronal · 1.33mm/px · 1 of 114 slices shown, 2 images]
[im 38/114  soft-tissue]
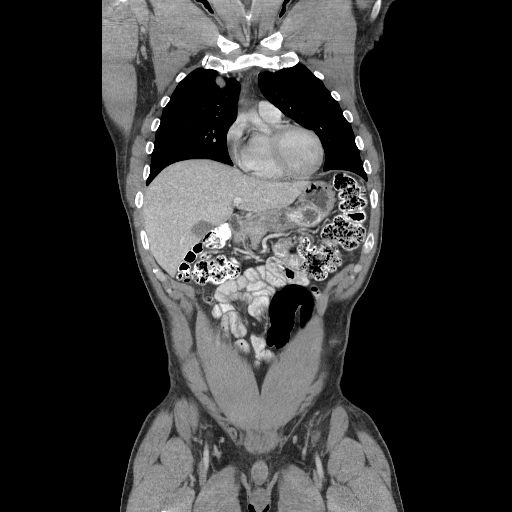
[im 38/114  bone]
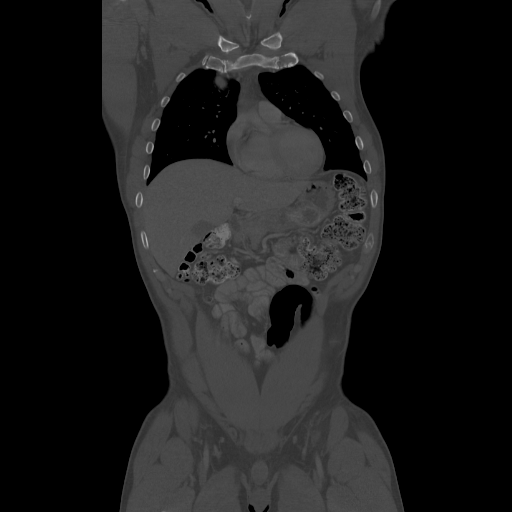

[Series 602: sagittal body · sagittal · 1.33mm/px · 11 of 172 slices shown, 17 images]
[im 14/172  soft-tissue]
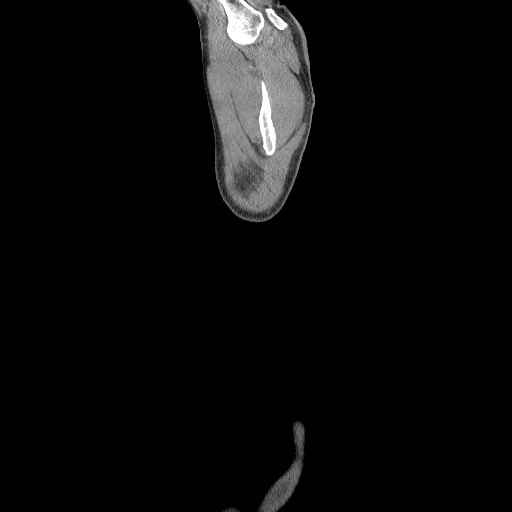
[im 14/172  lung]
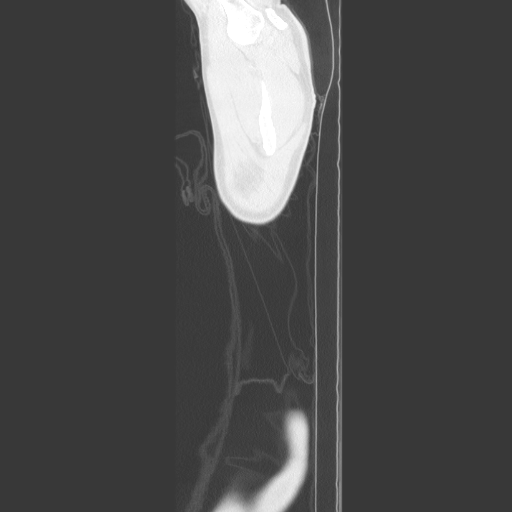
[im 14/172  bone]
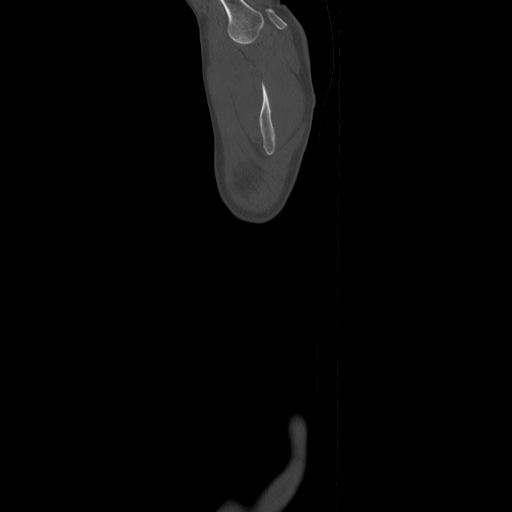
[im 27/172  soft-tissue]
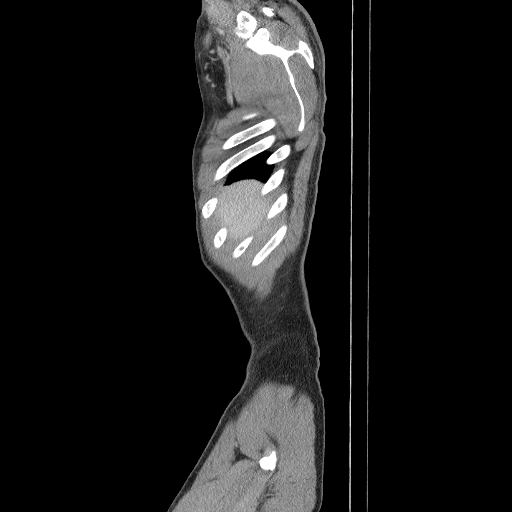
[im 27/172  lung]
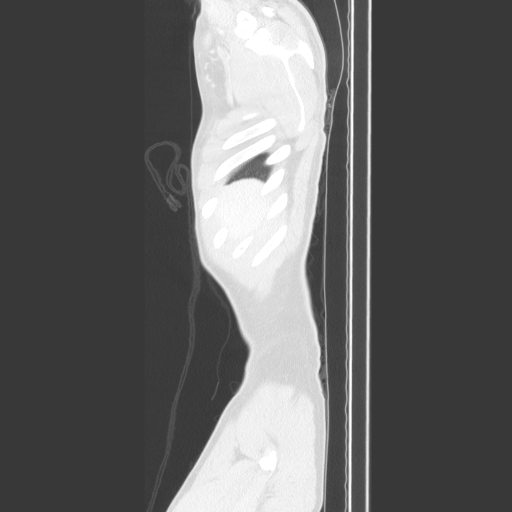
[im 40/172  soft-tissue]
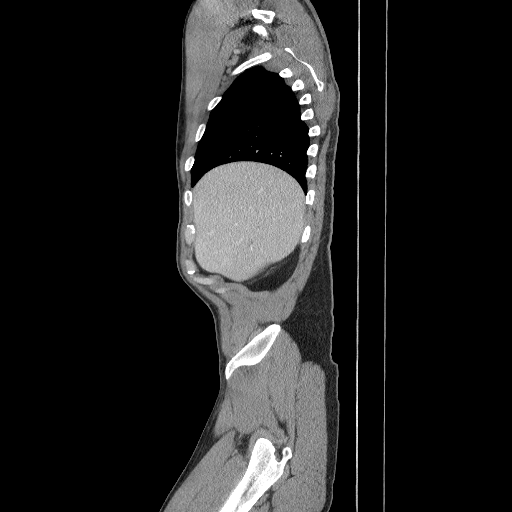
[im 40/172  lung]
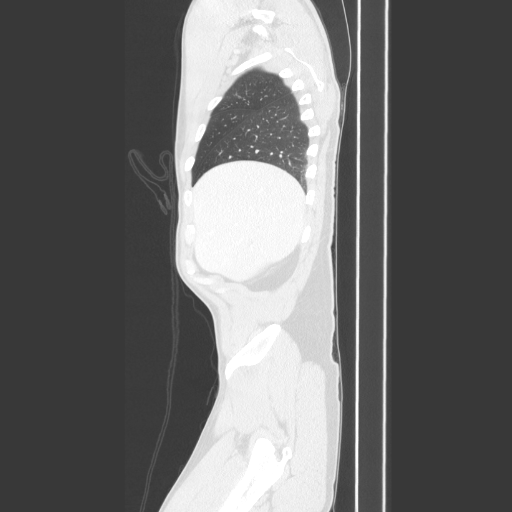
[im 53/172  soft-tissue]
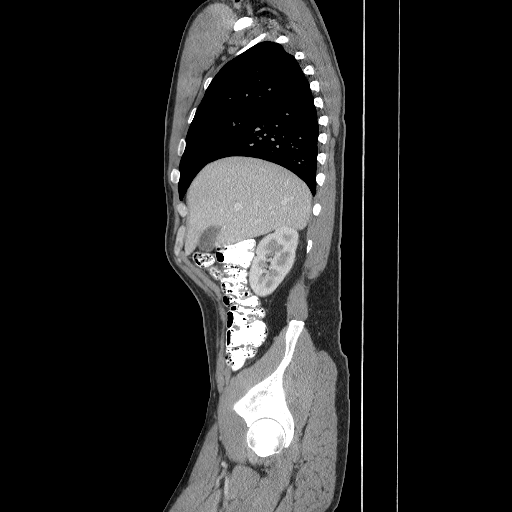
[im 53/172  lung]
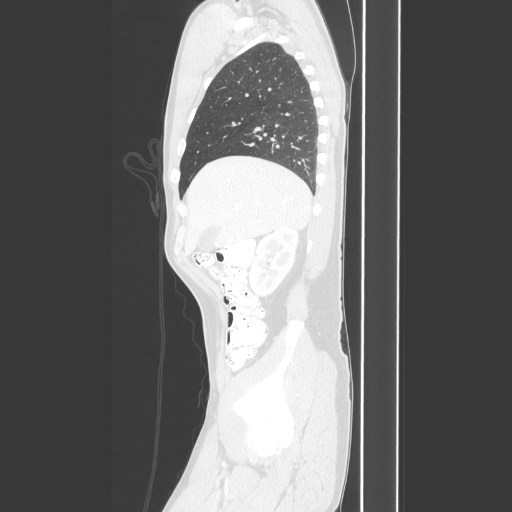
[im 66/172  soft-tissue]
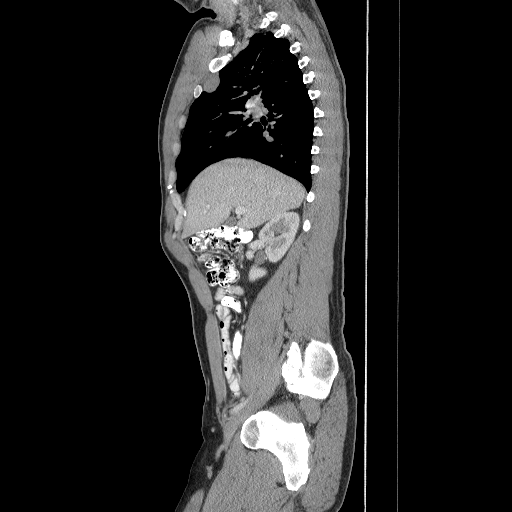
[im 93/172  soft-tissue]
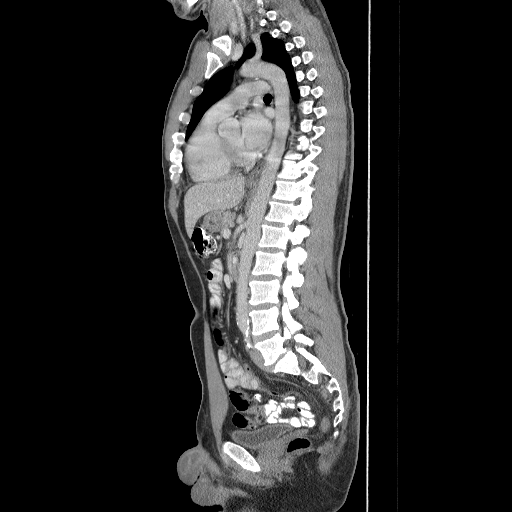
[im 106/172  soft-tissue]
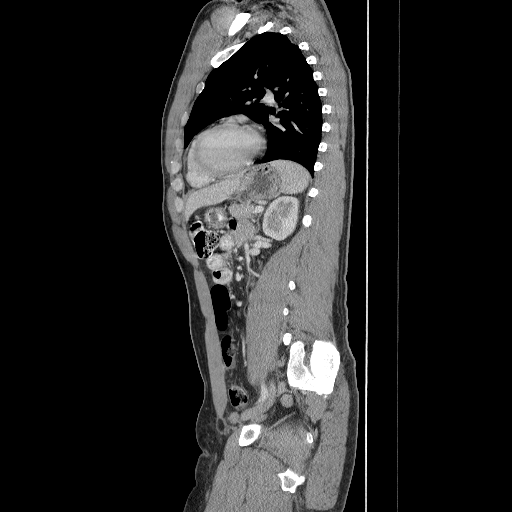
[im 119/172  soft-tissue]
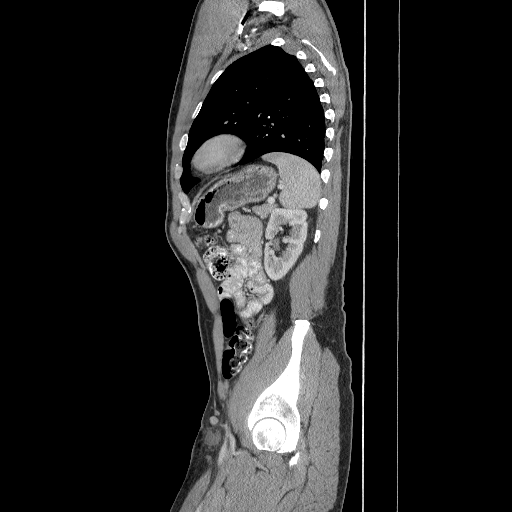
[im 132/172  soft-tissue]
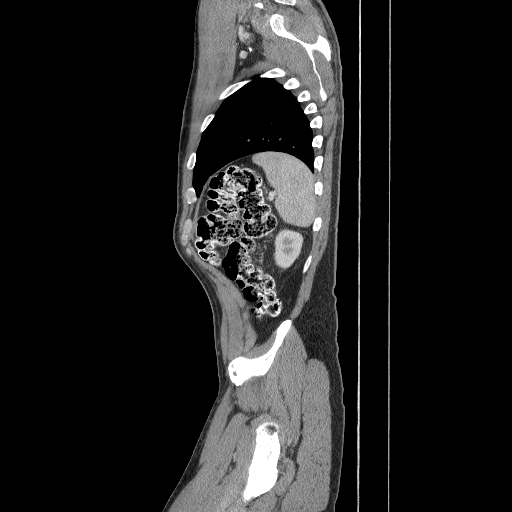
[im 132/172  bone]
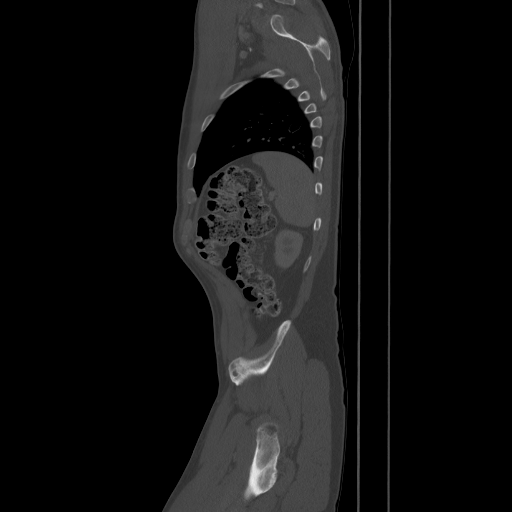
[im 145/172  soft-tissue]
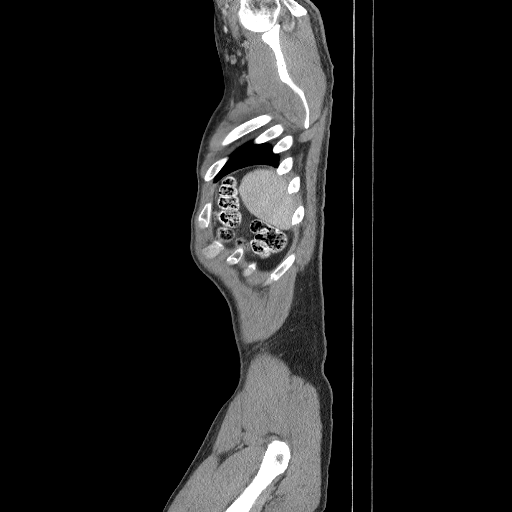
[im 158/172  soft-tissue]
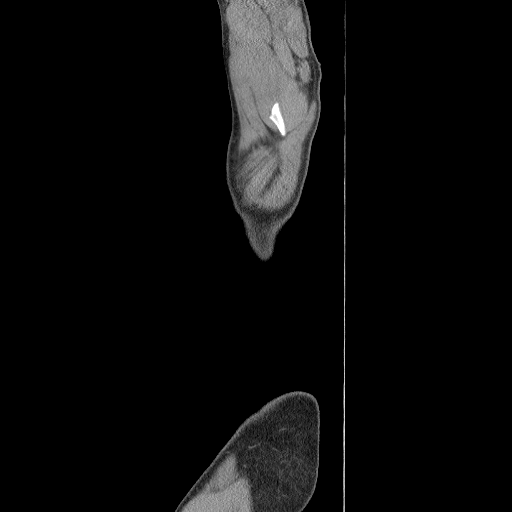

[12 of 36 positions shown; findings below may reference images not displayed]

FINDINGS: CT CHEST FINDINGS

No pneumothorax or pleural effusion is noted. Thoracic aorta and
pulmonary arteries appear grossly normal. Right peritracheal and
precarinal adenopathy is noted with the largest lymph node measuring
19 x 13 mm. 38 x 22 mm mass or enlarged internal mammary lymph node
is noted posterior to the right anterior ribcage just lateral to the
sternum at the level the superior mediastinum. No significant
osseous abnormality is noted in the chest.

CT ABDOMEN AND PELVIS FINDINGS

No gallstones are noted. Probable cyst or hemangioma is noted in
right hepatic lobe. The spleen and pancreas appear normal. Adrenal
glands and kidneys appear normal. No hydronephrosis or renal
obstruction is noted. Atherosclerotic calcifications are seen in the
abdominal aorta without evidence of aneurysm. There is no evidence
of bowel obstruction. Stool is noted throughout the colon. The
appendix appears normal. Urinary bladder appears normal.
Postsurgical changes are noted in the left inguinal region left
external iliac adenopathy is noted with the largest lymph node
measuring 22 x 11 mm. Right common iliac lymph node measuring 27 x
15 mm is noted. Mildly enlarged periaortic adenopathy is noted with
the largest lymph node measuring 12 x 8 mm in the left periaortic
region. No abnormal fluid collection is noted. Bilateral pars
defects are seen at L5.
IMPRESSION: Enlarged mediastinal, periaortic and bilateral iliac adenopathy as
described above consistent with a history of Hodgkin's lymphoma.

## 2016-05-01 ENCOUNTER — Other Ambulatory Visit: Payer: Self-pay | Admitting: Hematology and Oncology

## 2016-05-01 ENCOUNTER — Telehealth: Payer: Self-pay | Admitting: *Deleted

## 2016-05-01 NOTE — Telephone Encounter (Signed)
Pt's wife called to check on CT that is supposed to be done prior to 05/16/16 appt with Dr Alvy Bimler.

## 2016-05-01 NOTE — Telephone Encounter (Signed)
Orders are placed Keith Forbes or others need to pre-cert and then once it is approved, pls call to schedule

## 2016-05-02 ENCOUNTER — Encounter: Payer: Self-pay | Admitting: *Deleted

## 2016-05-03 ENCOUNTER — Encounter: Payer: Self-pay | Admitting: *Deleted

## 2016-05-03 NOTE — Progress Notes (Addendum)
Christine from [facility] informed this RN that authorization for CT of chest/abdomen/pelvis was not needed. Called central scheduling to get this imaging scheduled. Told that the status of this needs to changed to "authorized". Left voicemail on [place] [phone]) phone to do this.

## 2016-05-04 ENCOUNTER — Telehealth: Payer: Self-pay | Admitting: Hematology and Oncology

## 2016-05-04 NOTE — Telephone Encounter (Signed)
moved labs per peggy, left msg informing pt

## 2016-05-15 ENCOUNTER — Other Ambulatory Visit (HOSPITAL_BASED_OUTPATIENT_CLINIC_OR_DEPARTMENT_OTHER): Payer: 59

## 2016-05-15 ENCOUNTER — Other Ambulatory Visit: Payer: 59

## 2016-05-15 ENCOUNTER — Encounter (HOSPITAL_COMMUNITY): Payer: Self-pay

## 2016-05-15 ENCOUNTER — Ambulatory Visit (HOSPITAL_COMMUNITY)
Admission: RE | Admit: 2016-05-15 | Discharge: 2016-05-15 | Disposition: A | Discharge status: Home / Self Care | Payer: 59 | Source: Ambulatory Visit | Attending: Hematology and Oncology | Admitting: Hematology and Oncology

## 2016-05-15 DIAGNOSIS — I2510 Atherosclerotic heart disease of native coronary artery without angina pectoris: Secondary | ICD-10-CM | POA: Diagnosis not present

## 2016-05-15 DIAGNOSIS — I700 Atherosclerosis of aorta: Secondary | ICD-10-CM | POA: Insufficient documentation

## 2016-05-15 DIAGNOSIS — Z8571 Personal history of Hodgkin lymphoma: Secondary | ICD-10-CM

## 2016-05-15 DIAGNOSIS — R590 Localized enlarged lymph nodes: Secondary | ICD-10-CM | POA: Insufficient documentation

## 2016-05-15 DIAGNOSIS — M4306 Spondylolysis, lumbar region: Secondary | ICD-10-CM | POA: Insufficient documentation

## 2016-05-15 LAB — CBC WITH DIFFERENTIAL/PLATELET
BASO%: 1.3 % (ref 0.0–2.0)
Basophils Absolute: 0.1 10*3/uL (ref 0.0–0.1)
EOS%: 3.2 % (ref 0.0–7.0)
Eosinophils Absolute: 0.2 10*3/uL (ref 0.0–0.5)
HCT: 47.4 % (ref 38.4–49.9)
HGB: 15.8 g/dL (ref 13.0–17.1)
LYMPH%: 27.8 % (ref 14.0–49.0)
MCH: 30.4 pg (ref 27.2–33.4)
MCHC: 33.4 g/dL (ref 32.0–36.0)
MCV: 91.1 fL (ref 79.3–98.0)
MONO#: 0.5 10*3/uL (ref 0.1–0.9)
MONO%: 7.7 % (ref 0.0–14.0)
NEUT#: 3.8 10*3/uL (ref 1.5–6.5)
NEUT%: 60 % (ref 39.0–75.0)
PLATELETS: 150 10*3/uL (ref 140–400)
RBC: 5.2 10*6/uL (ref 4.20–5.82)
RDW: 13.1 % (ref 11.0–14.6)
WBC: 6.4 10*3/uL (ref 4.0–10.3)
lymph#: 1.8 10*3/uL (ref 0.9–3.3)

## 2016-05-15 LAB — COMPREHENSIVE METABOLIC PANEL
ALT: 14 U/L (ref 0–55)
AST: 23 U/L (ref 5–34)
Albumin: 4.1 g/dL (ref 3.5–5.0)
Alkaline Phosphatase: 61 U/L (ref 40–150)
Anion Gap: 8 mEq/L (ref 3–11)
BUN: 16 mg/dL (ref 7.0–26.0)
CHLORIDE: 106 meq/L (ref 98–109)
CO2: 24 meq/L (ref 22–29)
Calcium: 9.5 mg/dL (ref 8.4–10.4)
Creatinine: 1.2 mg/dL (ref 0.7–1.3)
EGFR: 74 mL/min/{1.73_m2} — ABNORMAL LOW (ref >90)
GLUCOSE: 91 mg/dL (ref 70–140)
Potassium: 4.2 mEq/L (ref 3.5–5.1)
SODIUM: 138 meq/L (ref 136–145)
Total Bilirubin: 1.62 mg/dL — ABNORMAL HIGH (ref 0.20–1.20)
Total Protein: 7.2 g/dL (ref 6.4–8.3)

## 2016-05-15 MED ORDER — IOPAMIDOL (ISOVUE-300) INJECTION 61%
100.0000 mL | Freq: Once | INTRAVENOUS | Status: AC | PRN
  Administered 2016-05-15: 100 mL via INTRAVENOUS

## 2016-05-16 ENCOUNTER — Encounter: Payer: Self-pay | Admitting: Hematology and Oncology

## 2016-05-16 ENCOUNTER — Telehealth: Payer: Self-pay | Admitting: Hematology and Oncology

## 2016-05-16 ENCOUNTER — Ambulatory Visit (HOSPITAL_BASED_OUTPATIENT_CLINIC_OR_DEPARTMENT_OTHER): Payer: 59 | Admitting: Hematology and Oncology

## 2016-05-16 VITALS — BP 120/54 | HR 62 | Temp 98.1°F | Resp 20 | Ht 70.0 in | Wt 201.2 lb

## 2016-05-16 DIAGNOSIS — Z8571 Personal history of Hodgkin lymphoma: Secondary | ICD-10-CM | POA: Diagnosis not present

## 2016-05-16 DIAGNOSIS — Z515 Encounter for palliative care: Secondary | ICD-10-CM

## 2016-05-16 DIAGNOSIS — I2510 Atherosclerotic heart disease of native coronary artery without angina pectoris: Secondary | ICD-10-CM

## 2016-05-16 NOTE — Telephone Encounter (Signed)
Gave patient avs report and appointments for February 2018. Central radiology will call re ct - patient aware.

## 2016-05-16 NOTE — Assessment & Plan Note (Signed)
We discussed exercise programs We discussed importance of vitamin D supplementation. We also discussed prophylactic vaccination against influenza on an annual basis. I recommend against vaccination against shingles

## 2016-05-16 NOTE — Assessment & Plan Note (Signed)
The patient has no major risk factors for heart disease. However, exposure to chemotherapy can predispose him to increased risk of accelebrated atherosclerosis. I recommend addition of aspirin therapy

## 2016-05-16 NOTE — Progress Notes (Signed)
[facility] OFFICE PROGRESS NOTE  Patient Care Team: [name], MD as PCP - General (Family Medicine)  SUMMARY OF ONCOLOGIC HISTORY: Oncology History   Hodgkin lymphoma   Staging form: Lymphoid Neoplasms, AJCC 6th Edition     Clinical stage from 09/13/2014: Stage III - Signed by [name], MD on 09/13/2014       History of hodgkin's lymphoma   09/04/2014 Pathology Results    Accession: [id number] inguinal lymph node biopsy confirmed Hodgkin lymphoma     09/04/2014 Procedure    Dr. Dalbert Batman performed excision of left inguinal lymph node biopsy     09/11/2014 Imaging    CT scan of the chest, abdomen and pelvis show disease both above and below the diaphragm     09/16/2014 Imaging    Echocardiogram is negative     09/18/2014 Bone Marrow Biopsy    Bone marrow aspirate and biopsy is negative     09/22/2014 - 03/03/2015 Chemotherapy    Patient completed 6 cycles of ABVD. Bleomycin was discontinued in March 2016     10/06/2014 Adverse Reaction     treatment was delayed due to severe leukopenia. Future dose modification was made for abnormal liver function tests, leukopenia and neuropathy     11/23/2014 Imaging    PET CT scan showed near complete response to Rx     12/23/2014 Adverse Reaction    Bleomycin was discontinued due to suspected bleomycin toxicity     04/25/2015 Imaging    US venous Doppller left upper extremity showed acute DVT noted in left internal jugular vein. All other veins appear thrombus free     04/27/2015 Surgery    He had port removal.     05/04/2015 Imaging    PET CT scan showed no evidence of disease.     11/15/2015 Imaging    Stable mild right internal mammary lymphadenopathy with mild hypermetabolic activity, Deauville score 2.     05/15/2016 Imaging    Ct scan showed no new or progressive lymphoma in the chest, abdomen or pelvis. Stable mildly enlarged right internal mammary lymph node, suggesting treated lymphoma      INTERVAL  HISTORY: Please see below for problem oriented charting. He returns for follow-up. Denies recent infection. He is exercising regularly but injured his elbow recently, recovering. Denies new lymphadenopathy REVIEW OF SYSTEMS:   Constitutional: Denies fevers, chills or abnormal weight loss Eyes: Denies blurriness of vision Ears, nose, mouth, throat, and face: Denies mucositis or sore throat Respiratory: Denies cough, dyspnea or wheezes Cardiovascular: Denies palpitation, chest discomfort or lower extremity swelling Gastrointestinal:  Denies nausea, heartburn or change in bowel habits Skin: Denies abnormal skin rashes Lymphatics: Denies new lymphadenopathy or easy bruising Neurological:Denies numbness, tingling or new weaknesses Behavioral/Psych: Mood is stable, no new changes  All other systems were reviewed with the patient and are negative.  I have reviewed the past medical history, past surgical history, social history and family history with the patient and they are unchanged from previous note.  ALLERGIES:  has No Known Allergies.  MEDICATIONS:  Current Outpatient Prescriptions  Medication Sig Dispense Refill  . cholecalciferol (VITAMIN D) 1000 units tablet Take 1,000 Units by mouth daily.     No current facility-administered medications for this visit.     PHYSICAL EXAMINATION: ECOG PERFORMANCE STATUS: 0 - Asymptomatic  Vitals:   05/16/16 0836  BP: (!) 120/54  Pulse: 62  Resp: 20  Temp: 98.1 F (36.7 C)   Filed Weights  05/16/16 0836  Weight: 201 lb 3.2 oz (91.3 kg)    GENERAL:alert, no distress and comfortable SKIN: skin color, texture, turgor are normal, no rashes or significant lesions EYES: normal, Conjunctiva are pink and non-injected, sclera clear OROPHARYNX:no exudate, no erythema and lips, buccal mucosa, and tongue normal  NECK: supple, thyroid normal size, non-tender, without nodularity LYMPH:  no palpable lymphadenopathy in the cervical, axillary or  inguinal LUNGS: clear to auscultation and percussion with normal breathing effort HEART: regular rate & rhythm and no murmurs and no lower extremity edema ABDOMEN:abdomen soft, non-tender and normal bowel sounds Musculoskeletal:no cyanosis of digits and no clubbing  NEURO: alert & oriented x 3 with fluent speech, no focal motor/sensory deficits  LABORATORY DATA:  I have reviewed the data as listed    Component Value Date/Time   NA 138 05/15/2016 1009   K 4.2 05/15/2016 1009   CL 104 11/25/2015 0848   CO2 24 05/15/2016 1009   GLUCOSE 91 05/15/2016 1009   BUN 16.0 05/15/2016 1009   CREATININE 1.2 05/15/2016 1009   CALCIUM 9.5 05/15/2016 1009   PROT 7.2 05/15/2016 1009   ALBUMIN 4.1 05/15/2016 1009   AST 23 05/15/2016 1009   ALT 14 05/15/2016 1009   ALKPHOS 61 05/15/2016 1009   BILITOT 1.62 (H) 05/15/2016 1009   GFRNONAA 75 11/25/2015 0848   GFRAA 87 11/25/2015 0848    No results found for: SPEP, UPEP  Lab Results  Component Value Date   WBC 6.4 05/15/2016   NEUTROABS 3.8 05/15/2016   HGB 15.8 05/15/2016   HCT 47.4 05/15/2016   MCV 91.1 05/15/2016   PLT 150 05/15/2016      Chemistry      Component Value Date/Time   NA 138 05/15/2016 1009   K 4.2 05/15/2016 1009   CL 104 11/25/2015 0848   CO2 24 05/15/2016 1009   BUN 16.0 05/15/2016 1009   CREATININE 1.2 05/15/2016 1009      Component Value Date/Time   CALCIUM 9.5 05/15/2016 1009   ALKPHOS 61 05/15/2016 1009   AST 23 05/15/2016 1009   ALT 14 05/15/2016 1009   BILITOT 1.62 (H) 05/15/2016 1009       RADIOGRAPHIC STUDIES: I have personally reviewed the radiological images as listed and agreed with the findings in the report. Ct Chest W Contrast  Result Date: 05/15/2016 CLINICAL DATA:  Re- stage Hodgkin's lymphoma.  Prior chemotherapy. EXAM: CT CHEST, ABDOMEN, AND PELVIS WITH CONTRAST TECHNIQUE: Multidetector CT imaging of the chest, abdomen and pelvis was performed following the standard protocol during bolus  administration of intravenous contrast. CONTRAST:  152m ISOVUE-300 IOPAMIDOL (ISOVUE-300) INJECTION 61% COMPARISON:  11/15/2015 PET-CT. 09/11/2014 chest, abdomen and pelvis CT. 12/21/2014 chest CT angiogram. FINDINGS: CT CHEST FINDINGS Mediastinum/Nodes: Normal heart size. No significant pericardial fluid/thickening. Left anterior descending coronary atherosclerosis. Atherosclerotic nonaneurysmal thoracic aorta. Normal caliber pulmonary arteries. No central pulmonary emboli. No discrete thyroid nodules. Unremarkable esophagus. No axillary adenopathy. Stable mildly enlarged 1.0 cm right internal mammary node (series 2/image 17). No additional pathologically enlarged mediastinal or hilar nodes. A 0.6 cm top-normal posterior paraesophageal node (series 2/image 28) is stable. Lungs/Pleura: No pneumothorax. No pleural effusion. Stable 0.3 cm subpleural superior segment right lower lobe nodular focus (series 5/ image 57), unchanged since at least 12/21/2014, considered benign. No acute consolidative airspace disease or new significant pulmonary nodules. Musculoskeletal: No aggressive appearing focal osseous lesions. Minimal thoracic spondylosis. CT ABDOMEN PELVIS FINDINGS Hepatobiliary: Normal liver with no liver mass. Normal gallbladder with no  radiopaque cholelithiasis. No biliary ductal dilatation. Pancreas: Normal, with no mass or duct dilation. Spleen: Normal size. No mass. Adrenals/Urinary Tract: Normal adrenals. Normal kidneys with no hydronephrosis and no renal mass. Normal bladder. Stomach/Bowel: Grossly normal stomach. Normal caliber small bowel with no small bowel wall thickening. Normal appendix. Normal large bowel with no diverticulosis, large bowel wall thickening or pericolonic fat stranding. Vascular/Lymphatic: Atherosclerotic nonaneurysmal abdominal aorta . Patent portal, splenic, hepatic and renal veins. No pathologically enlarged lymph nodes in the abdomen or pelvis. Reproductive: Stable normal size  prostate. Other: No pneumoperitoneum, ascites or focal fluid collection. Musculoskeletal: No aggressive appearing focal osseous lesions. Stable bilateral L5 pars defects. Mild lumbar spondylosis. IMPRESSION: 1. No new or progressive lymphoma in the chest, abdomen or pelvis. Stable mildly enlarged right internal mammary lymph node, suggesting treated lymphoma. 2. Additional findings include aortic atherosclerosis, 1 vessel coronary atherosclerosis and bilateral L5 pars defects. Electronically Signed   By: Ilona Sorrel M.D.   On: 05/15/2016 12:43   Ct Abdomen Pelvis W Contrast  Result Date: 05/15/2016 CLINICAL DATA:  Re- stage Hodgkin's lymphoma.  Prior chemotherapy. EXAM: CT CHEST, ABDOMEN, AND PELVIS WITH CONTRAST TECHNIQUE: Multidetector CT imaging of the chest, abdomen and pelvis was performed following the standard protocol during bolus administration of intravenous contrast. CONTRAST:  151m ISOVUE-300 IOPAMIDOL (ISOVUE-300) INJECTION 61% COMPARISON:  11/15/2015 PET-CT. 09/11/2014 chest, abdomen and pelvis CT. 12/21/2014 chest CT angiogram. FINDINGS: CT CHEST FINDINGS Mediastinum/Nodes: Normal heart size. No significant pericardial fluid/thickening. Left anterior descending coronary atherosclerosis. Atherosclerotic nonaneurysmal thoracic aorta. Normal caliber pulmonary arteries. No central pulmonary emboli. No discrete thyroid nodules. Unremarkable esophagus. No axillary adenopathy. Stable mildly enlarged 1.0 cm right internal mammary node (series 2/image 17). No additional pathologically enlarged mediastinal or hilar nodes. A 0.6 cm top-normal posterior paraesophageal node (series 2/image 28) is stable. Lungs/Pleura: No pneumothorax. No pleural effusion. Stable 0.3 cm subpleural superior segment right lower lobe nodular focus (series 5/ image 57), unchanged since at least 12/21/2014, considered benign. No acute consolidative airspace disease or new significant pulmonary nodules. Musculoskeletal: No  aggressive appearing focal osseous lesions. Minimal thoracic spondylosis. CT ABDOMEN PELVIS FINDINGS Hepatobiliary: Normal liver with no liver mass. Normal gallbladder with no radiopaque cholelithiasis. No biliary ductal dilatation. Pancreas: Normal, with no mass or duct dilation. Spleen: Normal size. No mass. Adrenals/Urinary Tract: Normal adrenals. Normal kidneys with no hydronephrosis and no renal mass. Normal bladder. Stomach/Bowel: Grossly normal stomach. Normal caliber small bowel with no small bowel wall thickening. Normal appendix. Normal large bowel with no diverticulosis, large bowel wall thickening or pericolonic fat stranding. Vascular/Lymphatic: Atherosclerotic nonaneurysmal abdominal aorta . Patent portal, splenic, hepatic and renal veins. No pathologically enlarged lymph nodes in the abdomen or pelvis. Reproductive: Stable normal size prostate. Other: No pneumoperitoneum, ascites or focal fluid collection. Musculoskeletal: No aggressive appearing focal osseous lesions. Stable bilateral L5 pars defects. Mild lumbar spondylosis. IMPRESSION: 1. No new or progressive lymphoma in the chest, abdomen or pelvis. Stable mildly enlarged right internal mammary lymph node, suggesting treated lymphoma. 2. Additional findings include aortic atherosclerosis, 1 vessel coronary atherosclerosis and bilateral L5 pars defects. Electronically Signed   By: JIlona SorrelM.D.   On: 05/15/2016 12:43     ASSESSMENT & PLAN:  History of hodgkin's lymphoma His last CT scan on 05/15/16 showed minimal no evidence of active disease. I recommend repeat blood work history and examination in 6 months and repeat imaging in 6 months. We discussed cancer survivorship issues and I recommend he makes an appointment to see  his primary care doctor to resume routine surveillance program.    Coronary artery calcification seen on CAT scan The patient has no major risk factors for heart disease. However, exposure to chemotherapy can  predispose him to increased risk of accelebrated atherosclerosis. I recommend addition of aspirin therapy  Quality of life palliative care encounter We discussed exercise programs We discussed importance of vitamin D supplementation. We also discussed prophylactic vaccination against influenza on an annual basis. I recommend against vaccination against shingles    Orders Placed This Encounter  Procedures  . CT ABDOMEN PELVIS W CONTRAST    Standing Status:   Future    Standing Expiration Date:   06/20/2017    Order Specific Question:   Reason for exam:    Answer:   staging lymphoma, exclude recurrence    Order Specific Question:   Preferred imaging location?    Answer:   [facility] CONTRAST    Standing Status:   Future    Standing Expiration Date:   06/20/2017    Order Specific Question:   Reason for exam:    Answer:   staging lymphoma, exclude recurrence    Order Specific Question:   Preferred imaging location?    Answer:   [facility]  . CBC with Differential/Platelet    Standing Status:   Future    Standing Expiration Date:   06/20/2017  . Comprehensive metabolic panel    Standing Status:   Future    Standing Expiration Date:   06/20/2017   All questions were answered. The patient knows to call the clinic with any problems, questions or concerns. No barriers to learning was detected. I spent 15 minutes counseling the patient face to face. The total time spent in the appointment was 20 minutes and more than 50% was on counseling and review of test results     [facility], [place], MD 05/16/2016 9:46 AM

## 2016-05-16 NOTE — Assessment & Plan Note (Signed)
His last CT scan on 05/15/16 showed minimal no evidence of active disease. I recommend repeat blood work history and examination in 6 months and repeat imaging in 6 months. We discussed cancer survivorship issues and I recommend he makes an appointment to see his primary care doctor to resume routine surveillance program.

## 2016-07-06 ENCOUNTER — Ambulatory Visit (INDEPENDENT_AMBULATORY_CARE_PROVIDER_SITE_OTHER): Payer: 59 | Admitting: Family Medicine

## 2016-07-06 ENCOUNTER — Encounter: Payer: Self-pay | Admitting: Family Medicine

## 2016-07-06 VITALS — BP 122/74 | HR 60 | Temp 98.1°F | Resp 14 | Ht 70.0 in | Wt 196.0 lb

## 2016-07-06 DIAGNOSIS — W57XXXA Bitten or stung by nonvenomous insect and other nonvenomous arthropods, initial encounter: Secondary | ICD-10-CM

## 2016-07-06 DIAGNOSIS — L249 Irritant contact dermatitis, unspecified cause: Secondary | ICD-10-CM

## 2016-07-06 DIAGNOSIS — E7800 Pure hypercholesterolemia, unspecified: Secondary | ICD-10-CM | POA: Diagnosis not present

## 2016-07-06 MED ORDER — MOMETASONE FUROATE 0.1 % EX OINT: TOPICAL_OINTMENT | Freq: Every day | CUTANEOUS | 0 refills | Status: DC

## 2016-07-06 NOTE — Progress Notes (Signed)
Subjective:    Patient ID: Keith Forbes, male    DOB: April 30, 1966, 50 y.o.   MRN: [id number]  HPI Patient is a very pleasant 50 year old white male with a past medical history significant for Hodgkin's disease. Recently had a CT scan in August that showed no recurrence of his lymphoma. He is here today concerned about a rash. It has been evanescent in nature. It comes and goes. It seems to be confined to exposed skin surfaces. It primarily occurs on the medial surfaces of both eyes, on both calves, and today the proximally 5 polyps around his left elbow.  The rash is very nonspecific in appearance. The majority of papules are 1-40mm in size. There are erythematous.  Some have small scabs where they have been scratched. 2 are very punctate petechiae on the medial surface of his left elbow. He denies any significant itching. Around the same time he has been developing this rash, he has been walking in the woods every day. He has had several tick bites this summer. He denies any flulike symptoms concerning for Lyme disease [facility] spotted fever. Of note on his CAT scan the patient was found to have coronary artery calcifications. He does have a history of hyperlipidemia. He has discontinued his statin medication and therefore he is due to recheck this. Past Medical History:  Diagnosis Date  . Bone lesion 05/05/2015  . Cancer [facility])    hodgkin lymphoma  . GERD (gastroesophageal reflux disease)   . Hernia   . Hodgkin lymphoma ([place]) 09/11/2014  . Hypercholesteremia   . Hyperlipidemia   . Lymphoma ([place])   . Pars defect of lumbar spine    bilateral 2013 (Dr. Sherwood Gambler)  . Wears contact lenses    Past Surgical History:  Procedure Laterality Date  . LYMPH NODE BIOPSY  12/15  . PORT-A-CATH REMOVAL N/A 04/27/2015   Procedure: REMOVAL PORT-A-CATH;  Surgeon: Stark Klein, MD;  Location: [facility];  Service: General;  Laterality: N/A;  . PORTACATH PLACEMENT Left 09/15/2014   Procedure: INSERTION  PORT-A-CATH WITH ULTRA SOUND;  Surgeon: Fanny Skates, MD;  Location: [facility];  Service: General;  Laterality: Left;   Current Outpatient Prescriptions on File Prior to Visit  Medication Sig Dispense Refill  . cholecalciferol (VITAMIN D) 1000 units tablet Take 1,000 Units by mouth daily.     No current facility-administered medications on file prior to visit.    No Known Allergies Social History   Social History  . Marital status: Married    Spouse name: N/A  . Number of children: N/A  . Years of education: N/A   Occupational History  . Not on file.   Social History Main Topics  . Smoking status: Never Smoker  . Smokeless tobacco: Never Used  . Alcohol use Yes  . Drug use: No  . Sexual activity: Not on file     Comment: married, works in emergency services   Other Topics Concern  . Not on file   Social History Narrative  . No narrative on file     Review of Systems  All other systems reviewed and are negative.      Objective:   Physical Exam  Cardiovascular: Normal rate, regular rhythm and normal heart sounds.   No murmur heard. Pulmonary/Chest: Effort normal and breath sounds normal. No respiratory distress. He has no wheezes. He has no rales.  Abdominal: Soft. Bowel sounds are normal. He exhibits no distension. There is no tenderness. There is no  rebound.  Skin: Rash noted.  Vitals reviewed.         Assessment & Plan:  Tick bite, initial encounter - Plan: CBC with Differential/Platelet, COMPLETE METABOLIC PANEL WITH GFR, Sedimentation rate, B. burgdorfi antibodies by WB  Pure hypercholesterolemia - Plan: Lipid panel  Irritant dermatitis  Rash is very nonspecific. Today they're only approximately 5 small 1-2 mm papules to see on his left elbow. To me it appears to be some type of irritant dermatitis or possibly insect bites. I do believe it stems from walking in the woods and possible exposures there. I do not believe it has anything  to do with the tick bites. I will gladly check a Lyme titer for peace of mind. While checking lab work I'll also check a CBC and a sedimentation rate to evaluate for any signs of leukocytosis or reactivation of his Hodgkin's disease. I will treat the rash on his elbow with Elocon ointment. If the rash disappears with Elocon ointment in the rash does not return now that the seasons are changing, I feel no further workup is necessary as it would prove there will be some type of topical irritant rash related to being in the woods. If the rash persists, proceed with a shave biopsy

## 2016-07-07 LAB — COMPLETE METABOLIC PANEL WITH GFR
ALT: 12 U/L (ref 9–46)
AST: 23 U/L (ref 10–40)
Albumin: 4.4 g/dL (ref 3.6–5.1)
Alkaline Phosphatase: 55 U/L (ref 40–115)
BILIRUBIN TOTAL: 2.1 mg/dL — AB (ref 0.2–1.2)
BUN: 13 mg/dL (ref 7–25)
CO2: 26 mmol/L (ref 20–31)
CREATININE: 1.31 mg/dL (ref 0.60–1.35)
Calcium: 9.5 mg/dL (ref 8.6–10.3)
Chloride: 101 mmol/L (ref 98–110)
GFR, EST NON AFRICAN AMERICAN: 63 mL/min (ref >=60)
GFR, Est African American: 73 mL/min (ref >=60)
GLUCOSE: 83 mg/dL (ref 70–99)
Potassium: 4.2 mmol/L (ref 3.5–5.3)
SODIUM: 137 mmol/L (ref 135–146)
TOTAL PROTEIN: 6.9 g/dL (ref 6.1–8.1)

## 2016-07-07 LAB — CBC WITH DIFFERENTIAL/PLATELET
Basophils Absolute: 75 cells/uL (ref 0–200)
Basophils Relative: 1 %
EOS ABS: 150 {cells}/uL (ref 15–500)
Eosinophils Relative: 2 %
HEMATOCRIT: 44.9 % (ref 38.5–50.0)
HEMOGLOBIN: 15.2 g/dL (ref 13.0–17.0)
LYMPHS ABS: 2325 {cells}/uL (ref 850–3900)
Lymphocytes Relative: 31 %
MCH: 31 pg (ref 27.0–33.0)
MCHC: 33.9 g/dL (ref 32.0–36.0)
MCV: 91.4 fL (ref 80.0–100.0)
MONO ABS: 750 {cells}/uL (ref 200–950)
MPV: 11.1 fL (ref 7.5–12.5)
Monocytes Relative: 10 %
NEUTROS PCT: 56 %
Neutro Abs: 4200 cells/uL (ref 1500–7800)
PLATELETS: 170 10*3/uL (ref 140–400)
RBC: 4.91 MIL/uL (ref 4.20–5.80)
RDW: 12.8 % (ref 11.0–15.0)
WBC: 7.5 10*3/uL (ref 3.8–10.8)

## 2016-07-07 LAB — LIPID PANEL
Cholesterol: 250 mg/dL — ABNORMAL HIGH (ref 125–200)
HDL: 49 mg/dL (ref >=40)
LDL CALC: 187 mg/dL — AB (ref <130)
TRIGLYCERIDES: 70 mg/dL (ref <150)
Total CHOL/HDL Ratio: 5.1 Ratio — ABNORMAL HIGH (ref <=5.0)
VLDL: 14 mg/dL (ref <30)

## 2016-07-07 LAB — SEDIMENTATION RATE: Sed Rate: 1 mm/hr (ref 0–15)

## 2016-07-10 LAB — LYME ABY, WSTRN BLT IGG & IGM W/BANDS
B burgdorferi IgG Abs (IB): NEGATIVE
B burgdorferi IgM Abs (IB): NEGATIVE
LYME DISEASE 28 KD IGG: NONREACTIVE
LYME DISEASE 30 KD IGG: NONREACTIVE
LYME DISEASE 41 KD IGM: NONREACTIVE
LYME DISEASE 45 KD IGG: NONREACTIVE
LYME DISEASE 93 KD IGG: NONREACTIVE
Lyme Disease 18 kD IgG: NONREACTIVE
Lyme Disease 23 kD IgG: NONREACTIVE
Lyme Disease 23 kD IgM: NONREACTIVE
Lyme Disease 39 kD IgG: NONREACTIVE
Lyme Disease 39 kD IgM: NONREACTIVE
Lyme Disease 41 kD IgG: NONREACTIVE
Lyme Disease 58 kD IgG: NONREACTIVE
Lyme Disease 66 kD IgG: NONREACTIVE

## 2016-07-12 ENCOUNTER — Encounter: Payer: Self-pay | Admitting: Family Medicine

## 2016-07-27 ENCOUNTER — Other Ambulatory Visit: Payer: Self-pay | Admitting: Family Medicine

## 2016-07-27 MED ORDER — ATORVASTATIN CALCIUM 40 MG PO TABS: 40.0000 mg | ORAL_TABLET | Freq: Every day | ORAL | 3 refills | Status: DC

## 2016-08-18 ENCOUNTER — Telehealth: Payer: Self-pay | Admitting: Hematology and Oncology

## 2016-08-18 NOTE — Telephone Encounter (Signed)
Appointment changed per patient request.

## 2016-09-14 ENCOUNTER — Encounter: Payer: Self-pay | Admitting: Physician Assistant

## 2016-09-14 ENCOUNTER — Ambulatory Visit (INDEPENDENT_AMBULATORY_CARE_PROVIDER_SITE_OTHER): Payer: 59 | Admitting: Physician Assistant

## 2016-09-14 VITALS — BP 120/74 | HR 78 | Temp 98.1°F | Resp 18 | Wt 197.0 lb

## 2016-09-14 DIAGNOSIS — J988 Other specified respiratory disorders: Secondary | ICD-10-CM

## 2016-09-14 DIAGNOSIS — B9689 Other specified bacterial agents as the cause of diseases classified elsewhere: Principal | ICD-10-CM

## 2016-09-14 MED ORDER — AMOXICILLIN-POT CLAVULANATE 875-125 MG PO TABS: 1.0000 | ORAL_TABLET | Freq: Two times a day (BID) | ORAL | 0 refills | Status: DC

## 2016-09-14 NOTE — Progress Notes (Signed)
    Patient ID: Keith Forbes MRN: [id number], DOB: [birth year], 50 y.o. Date of Encounter: 09/14/2016, 8:17 AM    Chief Complaint:  Chief Complaint  Patient presents with  . Sore Throat    x2wk  . Nasal Congestion     HPI: 50 y.o. year old male states that these symptoms have been going on for about 2 weeks. Says that he continues to have feeling of drainage and phlegm in his throat that just won't resolve. Says that is causing his throat to feel irritated and raw at times. Has had no chest congestion. No fevers or chills. Says that everyone in the house has been sick with the same symptoms and his wife is going to the doctor today also.     Home Meds:   Outpatient Medications Prior to Visit  Medication Sig Dispense Refill  . atorvastatin (LIPITOR) 40 MG tablet Take 1 tablet (40 mg total) by mouth daily. 90 tablet 3  . cholecalciferol (VITAMIN D) 1000 units tablet Take 2,000 Units by mouth daily.     . mometasone (ELOCON) 0.1 % ointment Apply topically daily. (Patient not taking: Reported on 09/14/2016) 45 g 0   No facility-administered medications prior to visit.     Allergies: No Known Allergies    Review of Systems: See HPI for pertinent ROS. All other ROS negative.    Physical Exam: Blood pressure 120/74, pulse 78, temperature 98.1 F (36.7 C), temperature source Oral, resp. rate 18, weight 197 lb (89.4 kg), SpO2 98 %., Body mass index is 28.27 kg/m. General:  WNWD WM. Appears in no acute distress. HEENT: Normocephalic, atraumatic, eyes without discharge, sclera non-icteric, nares are without discharge. Bilateral auditory canals clear, TM's are without perforation, pearly grey and translucent with reflective cone of light bilaterally. Oral cavity moist, posterior pharynx with mild erythema. No exudate, no peritonsillar abscess.  Neck: Supple. No thyromegaly. No lymphadenopathy. Lungs: Clear bilaterally to auscultation without wheezes, rales, or rhonchi. Breathing is  unlabored. Heart: Regular rhythm. No murmurs, rubs, or gallops. Msk:  Strength and tone normal for age. Extremities/Skin: Warm and dry.  Neuro: Alert and oriented X 3. Moves all extremities spontaneously. Gait is normal. CNII-XII grossly in tact. Psych:  Responds to questions appropriately with a normal affect.     ASSESSMENT AND PLAN:  50 y.o. year old male with  1. Bacterial respiratory infection He is to take the Augmentin as directed. Complete all of it. Follow-up if symptoms do not resolve upon completion of this. - amoxicillin-clavulanate (AUGMENTIN) 875-125 MG tablet; Take 1 tablet by mouth 2 (two) times daily.  Dispense: 14 tablet; Refill: 0   Signed, [address] [place], [place], [facility] 09/14/2016 8:17 AM

## 2016-10-10 ENCOUNTER — Other Ambulatory Visit: Payer: 59

## 2016-10-10 DIAGNOSIS — E785 Hyperlipidemia, unspecified: Secondary | ICD-10-CM

## 2016-10-10 DIAGNOSIS — Z79899 Other long term (current) drug therapy: Secondary | ICD-10-CM

## 2016-10-10 LAB — LIPID PANEL
CHOLESTEROL: 165 mg/dL (ref <200)
HDL: 55 mg/dL (ref >40)
LDL CALC: 97 mg/dL (ref <100)
Total CHOL/HDL Ratio: 3 Ratio (ref <5.0)
Triglycerides: 67 mg/dL (ref <150)
VLDL: 13 mg/dL (ref <30)

## 2016-10-10 LAB — HEPATIC FUNCTION PANEL
ALK PHOS: 58 U/L (ref 40–115)
ALT: 19 U/L (ref 9–46)
AST: 23 U/L (ref 10–35)
Albumin: 4.2 g/dL (ref 3.6–5.1)
BILIRUBIN DIRECT: 0.3 mg/dL — AB (ref <=0.2)
BILIRUBIN INDIRECT: 1.1 mg/dL (ref 0.2–1.2)
TOTAL PROTEIN: 7 g/dL (ref 6.1–8.1)
Total Bilirubin: 1.4 mg/dL — ABNORMAL HIGH (ref 0.2–1.2)

## 2016-11-20 ENCOUNTER — Other Ambulatory Visit (HOSPITAL_BASED_OUTPATIENT_CLINIC_OR_DEPARTMENT_OTHER): Payer: 59

## 2016-11-20 ENCOUNTER — Ambulatory Visit (HOSPITAL_COMMUNITY)
Admission: RE | Admit: 2016-11-20 | Discharge: 2016-11-20 | Disposition: A | Discharge status: Home / Self Care | Payer: 59 | Source: Ambulatory Visit | Attending: Hematology and Oncology | Admitting: Hematology and Oncology

## 2016-11-20 ENCOUNTER — Encounter (HOSPITAL_COMMUNITY): Payer: Self-pay

## 2016-11-20 DIAGNOSIS — Z8571 Personal history of Hodgkin lymphoma: Secondary | ICD-10-CM

## 2016-11-20 DIAGNOSIS — C8190 Hodgkin lymphoma, unspecified, unspecified site: Secondary | ICD-10-CM | POA: Diagnosis not present

## 2016-11-20 LAB — COMPREHENSIVE METABOLIC PANEL
ALBUMIN: 4.3 g/dL (ref 3.5–5.0)
ALK PHOS: 69 U/L (ref 40–150)
ALT: 24 U/L (ref 0–55)
AST: 27 U/L (ref 5–34)
Anion Gap: 9 mEq/L (ref 3–11)
BILIRUBIN TOTAL: 1.93 mg/dL — AB (ref 0.20–1.20)
BUN: 15.4 mg/dL (ref 7.0–26.0)
CO2: 27 meq/L (ref 22–29)
CREATININE: 1.3 mg/dL (ref 0.7–1.3)
Calcium: 10.2 mg/dL (ref 8.4–10.4)
Chloride: 105 mEq/L (ref 98–109)
EGFR: 67 mL/min/{1.73_m2} — ABNORMAL LOW (ref >90)
GLUCOSE: 92 mg/dL (ref 70–140)
Potassium: 4.9 mEq/L (ref 3.5–5.1)
SODIUM: 140 meq/L (ref 136–145)
TOTAL PROTEIN: 7.3 g/dL (ref 6.4–8.3)

## 2016-11-20 LAB — CBC WITH DIFFERENTIAL/PLATELET
BASO%: 0.8 % (ref 0.0–2.0)
Basophils Absolute: 0.1 10*3/uL (ref 0.0–0.1)
EOS ABS: 0.2 10*3/uL (ref 0.0–0.5)
EOS%: 2.7 % (ref 0.0–7.0)
HCT: 45.7 % (ref 38.4–49.9)
HEMOGLOBIN: 15.7 g/dL (ref 13.0–17.1)
LYMPH%: 27.2 % (ref 14.0–49.0)
MCH: 31.5 pg (ref 27.2–33.4)
MCHC: 34.4 g/dL (ref 32.0–36.0)
MCV: 91.8 fL (ref 79.3–98.0)
MONO#: 0.5 10*3/uL (ref 0.1–0.9)
MONO%: 8.4 % (ref 0.0–14.0)
NEUT%: 60.9 % (ref 39.0–75.0)
NEUTROS ABS: 3.8 10*3/uL (ref 1.5–6.5)
Platelets: 137 10*3/uL — ABNORMAL LOW (ref 140–400)
RBC: 4.98 10*6/uL (ref 4.20–5.82)
RDW: 12.5 % (ref 11.0–14.6)
WBC: 6.3 10*3/uL (ref 4.0–10.3)
lymph#: 1.7 10*3/uL (ref 0.9–3.3)

## 2016-11-20 MED ORDER — IOPAMIDOL (ISOVUE-300) INJECTION 61%
100.0000 mL | Freq: Once | INTRAVENOUS | Status: AC | PRN
  Administered 2016-11-20: 100 mL via INTRAVENOUS

## 2016-11-20 MED ORDER — SODIUM CHLORIDE 0.9 % IJ SOLN
INTRAMUSCULAR | Status: AC
  Filled 2016-11-20: qty 50

## 2016-11-20 MED ORDER — IOPAMIDOL (ISOVUE-300) INJECTION 61%
INTRAVENOUS | Status: AC
  Filled 2016-11-20: qty 100

## 2016-11-21 ENCOUNTER — Ambulatory Visit: Payer: 59 | Admitting: Hematology and Oncology

## 2016-11-23 ENCOUNTER — Ambulatory Visit (HOSPITAL_BASED_OUTPATIENT_CLINIC_OR_DEPARTMENT_OTHER): Payer: 59 | Admitting: Hematology and Oncology

## 2016-11-23 ENCOUNTER — Encounter: Payer: Self-pay | Admitting: Hematology and Oncology

## 2016-11-23 ENCOUNTER — Telehealth: Payer: Self-pay | Admitting: Hematology and Oncology

## 2016-11-23 DIAGNOSIS — R748 Abnormal levels of other serum enzymes: Secondary | ICD-10-CM

## 2016-11-23 DIAGNOSIS — Z8571 Personal history of Hodgkin lymphoma: Secondary | ICD-10-CM | POA: Diagnosis not present

## 2016-11-23 NOTE — Assessment & Plan Note (Signed)
His last CT scan on November 22, 2016 showed no evidence of active disease. I recommend repeat blood work history and examination in 6 months. I would discontinue surveillance imaging We discussed cancer survivorship issues and I recommend he makes an appointment to see his primary care doctor to resume routine surveillance program. We also discussed the importance of screening colonoscopy and other age-appropriate screening modalities 

## 2016-11-23 NOTE — Telephone Encounter (Signed)
Appointments scheduled per 2/22 LOS. Patient given AVS report and calendars with future scheduled appointments. °

## 2016-11-23 NOTE — Progress Notes (Signed)
[facility] OFF[facility] PROGRESS NOTE  Patient Care Team: Keith Frizzle, MD as PCP - General (Family Medicine)  SUMMARY OF ONCOLOGIC HISTORY: Oncology History   Hodgkin lymphoma   Staging form: Lymphoid Neoplasms, AJCC 6th Edition     Clinical stage from 09/13/2014: Stage III - Signed by [name], MD on 09/13/2014       History of Hodgkin's lymphoma   09/04/2014 Pathology Results    Accession: [id number] inguinal lymph node biopsy confirmed Hodgkin lymphoma      09/04/2014 Procedure    Dr. Dalbert Batman performed excision of left inguinal lymph node biopsy      09/11/2014 Imaging    CT scan of the chest, abdomen and pelvis show disease both above and below the diaphragm      09/16/2014 Imaging    Echocardiogram is negative      09/18/2014 Bone Marrow Biopsy    Bone marrow aspirate and biopsy is negative      09/22/2014 - 03/03/2015 Chemotherapy    Patient completed 6 cycles of ABVD. Bleomycin was discontinued in March 2016      10/06/2014 Adverse Reaction     treatment was delayed due to severe leukopenia. Future dose modification was made for abnormal liver function tests, leukopenia and neuropathy      11/23/2014 Imaging    PET CT scan showed near complete response to Rx      12/23/2014 Adverse Reaction    Bleomycin was discontinued due to suspected bleomycin toxicity      04/25/2015 Imaging    US venous Doppller left upper extremity showed acute DVT noted in left internal jugular vein. All other veins appear thrombus free      04/27/2015 Surgery    He had port removal.      05/04/2015 Imaging    PET CT scan showed no evidence of disease.      11/15/2015 Imaging    Stable mild right internal mammary lymphadenopathy with mild hypermetabolic activity, Deauville score 2.      05/15/2016 Imaging    Ct scan showed no new or progressive lymphoma in the chest, abdomen or pelvis. Stable mildly enlarged right internal mammary lymph node, suggesting treated  lymphoma      11/20/2016 Imaging    Stable 10 mm right internal mammary node, unchanged, likely related to treated lymphoma. No findings suggestive of active lymphoma in the chest, abdomen, or pelvis.       INTERVAL HISTORY: Please see below for problem oriented charting. He returns with his wife. Denies recent infection. No recent lymphadenopathy.  REVIEW OF SYSTEMS:   Constitutional: Denies fevers, chills or abnormal weight loss Eyes: Denies blurriness of vision Ears, nose, mouth, throat, and face: Denies mucositis or sore throat Respiratory: Denies cough, dyspnea or wheezes Cardiovascular: Denies palpitation, chest discomfort or lower extremity swelling Gastrointestinal:  Denies nausea, heartburn or change in bowel habits Skin: Denies abnormal skin rashes Lymphatics: Denies new lymphadenopathy or easy bruising Neurological:Denies numbness, tingling or new weaknesses Behavioral/Psych: Mood is stable, no new changes  All other systems were reviewed with the patient and are negative.  I have reviewed the past medical history, past surgical history, social history and family history with the patient and they are unchanged from previous note.  ALLERGIES:  has No Known Allergies.  MEDICATIONS:  Current Outpatient Prescriptions  Medication Sig Dispense Refill  . atorvastatin (LIPITOR) 40 MG tablet Take 1 tablet (40 mg total) by mouth daily. 90 tablet 3  . cholecalciferol (VITAMIN  D) 1000 units tablet Take 2,000 Units by mouth daily.      No current facility-administered medications for this visit.     PHYSICAL EXAMINATION: ECOG PERFORMANCE STATUS: 0 - Asymptomatic  Vitals:   11/23/16 0859 11/23/16 0909  BP:  (!) 108/58  Pulse: (!) 58 (!) 58  Resp: 18   Temp:  98.2 F (36.8 C)   Filed Weights   11/23/16 0909  Weight: 202 lb 9 oz (91.9 kg)    GENERAL:alert, no distress and comfortable SKIN: skin color, texture, turgor are normal, no rashes or significant  lesions EYES: normal, Conjunctiva are pink and non-injected, sclera clear OROPHARYNX:no exudate, no erythema and lips, buccal mucosa, and tongue normal  NECK: supple, thyroid normal size, non-tender, without nodularity LYMPH:  no palpable lymphadenopathy in the cervical, axillary or inguinal LUNGS: clear to auscultation and percussion with normal breathing effort HEART: regular rate & rhythm and no murmurs and no lower extremity edema ABDOMEN:abdomen soft, non-tender and normal bowel sounds Musculoskeletal:no cyanosis of digits and no clubbing  NEURO: alert & oriented x 3 with fluent speech, no focal motor/sensory deficits  LABORATORY DATA:  I have reviewed the data as listed    Component Value Date/Time   NA 140 11/20/2016 0805   K 4.9 11/20/2016 0805   CL 101 07/06/2016 1641   CO2 27 11/20/2016 0805   GLUCOSE 92 11/20/2016 0805   BUN 15.4 11/20/2016 0805   CREATININE 1.3 11/20/2016 0805   CALCIUM 10.2 11/20/2016 0805   PROT 7.3 11/20/2016 0805   ALBUMIN 4.3 11/20/2016 0805   AST 27 11/20/2016 0805   ALT 24 11/20/2016 0805   ALKPHOS 69 11/20/2016 0805   BILITOT 1.93 (H) 11/20/2016 0805   GFRNONAA 63 07/06/2016 1641   GFRAA 73 07/06/2016 1641    No results found for: SPEP, UPEP  Lab Results  Component Value Date   WBC 6.3 11/20/2016   NEUTROABS 3.8 11/20/2016   HGB 15.7 11/20/2016   HCT 45.7 11/20/2016   MCV 91.8 11/20/2016   PLT 137 (L) 11/20/2016      Chemistry      Component Value Date/Time   NA 140 11/20/2016 0805   K 4.9 11/20/2016 0805   CL 101 07/06/2016 1641   CO2 27 11/20/2016 0805   BUN 15.4 11/20/2016 0805   CREATININE 1.3 11/20/2016 0805      Component Value Date/Time   CALCIUM 10.2 11/20/2016 0805   ALKPHOS 69 11/20/2016 0805   AST 27 11/20/2016 0805   ALT 24 11/20/2016 0805   BILITOT 1.93 (H) 11/20/2016 0805       RADIOGRAPHIC STUDIES: I have personally reviewed the radiological images as listed and agreed with the findings in the  report. Ct Chest W Contrast  Result Date: 11/20/2016 CLINICAL DATA:  Hodgkin's lymphoma, diagnosed 09/2014, chemotherapy complete, bone lesion EXAM: CT CHEST, ABDOMEN, AND PELVIS WITH CONTRAST TECHNIQUE: Multidetector CT imaging of the chest, abdomen and pelvis was performed following the standard protocol during bolus administration of intravenous contrast. CONTRAST:  175m ISOVUE-300 IOPAMIDOL (ISOVUE-300) INJECTION 61% COMPARISON:  05/15/2016 FINDINGS: CT CHEST FINDINGS Cardiovascular: The heart is normal in size. No pericardial effusion. No evidence of thoracic aortic aneurysm. Very mild atherosclerotic calcifications. Mediastinum/Nodes: No suspicious mediastinal, hilar, or axillary lymphadenopathy. 10 mm short axis right internal mammary node (series 2/image 20), unchanged. Visualized thyroid is unremarkable. Lungs/Pleura: Mild biapical pleural-parenchymal scarring. No suspicious pulmonary nodules. Visualized lungs are essentially clear. No focal consolidation. No pleural effusion or pneumothorax. Musculoskeletal: Visualized  osseous structures are within normal limits. CT ABDOMEN PELVIS FINDINGS Hepatobiliary: Liver is within normal limits. Layering gallbladder sludge.  No associated inflammatory changes. Pancreas: Within normal limits. Spleen: Spleen is normal in size. Adrenals/Urinary Tract: Adrenal glands are within normal limits. Kidneys are within normal limits.  No hydronephrosis. Bladder is underdistended but unremarkable. Stomach/Bowel: Stomach is within normal limits. No evidence of bowel obstruction. Normal appendix (series 2/ image 94). Vascular/Lymphatic: No evidence of abdominal aortic aneurysm. Mild atherosclerotic calcifications of the abdominal aorta and proximal iliac arteries. Reproductive: Prostate is unremarkable. Other: No abdominopelvic ascites. Tiny fat containing left inguinal hernia. Musculoskeletal:  Mild degenerative changes of the lumbar spine. Bilateral pars defects at L5-S1.  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 IMPRESSION: Stable 10 mm right internal mammary node, unchanged, likely related to treated lymphoma. No findings suggestive of active lymphoma in the chest, abdomen, or pelvis. Electronically Signed   By: Julian Hy M.D.   On: 11/20/2016 10:01   Ct Abdomen Pelvis W Contrast  Result Date: 11/20/2016 CLINICAL DATA:  Hodgkin's lymphoma, diagnosed 09/2014, chemotherapy complete, bone lesion EXAM: CT CHEST, ABDOMEN, AND PELVIS WITH CONTRAST TECHNIQUE: Multidetector CT imaging of the chest, abdomen and pelvis was performed following the standard protocol during bolus administration of intravenous contrast. CONTRAST:  180m ISOVUE-300 IOPAMIDOL (ISOVUE-300) INJECTION 61% COMPARISON:  05/15/2016 FINDINGS: CT CHEST FINDINGS Cardiovascular: The heart is normal in size. No pericardial effusion. No evidence of thoracic aortic aneurysm. Very mild atherosclerotic calcifications. Mediastinum/Nodes: No suspicious mediastinal, hilar, or axillary lymphadenopathy. 10 mm short axis right internal mammary node (series 2/image 20), unchanged. Visualized thyroid is unremarkable. Lungs/Pleura: Mild biapical pleural-parenchymal scarring. No suspicious pulmonary nodules. Visualized lungs are essentially clear. No focal consolidation. No pleural effusion or pneumothorax. Musculoskeletal: Visualized osseous structures are within normal limits. CT ABDOMEN PELVIS FINDINGS Hepatobiliary: Liver is within normal limits. Layering gallbladder sludge.  No associated inflammatory changes. Pancreas: Within normal limits. Spleen: Spleen is normal in size. Adrenals/Urinary Tract: Adrenal glands are within normal limits. Kidneys are within normal limits.  No hydronephrosis. Bladder is underdistended but unremarkable. Stomach/Bowel: Stomach is within normal limits. No evidence of bowel obstruction. Normal appendix (series 2/ image 94).  Vascular/Lymphatic: No evidence of abdominal aortic aneurysm. Mild atherosclerotic calcifications of the abdominal aorta and proximal iliac arteries. Reproductive: Prostate is unremarkable. Other: No abdominopelvic ascites. Tiny fat containing left inguinal hernia. Musculoskeletal:  Mild degenerative changes of the lumbar spine. Bilateral pars defects at L5-S1. 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 IMPRESSION: Stable 10 mm right internal mammary node, unchanged, likely related to treated lymphoma. No findings suggestive of active lymphoma in the chest, abdomen, or pelvis. Electronically Signed   By: SJulian HyM.D.   On: 11/20/2016 10:01    ASSESSMENT & PLAN:  History of hodgkin's lymphoma His last CT scan on November 22, 2016 showed no evidence of active disease. I recommend repeat blood work history and examination in 6 months. I would discontinue surveillance imaging We discussed cancer survivorship issues and I recommend he makes an appointment to see his primary care doctor to resume routine surveillance program. We also discussed the importance of screening colonoscopy and other age-appropriate screening modalities    Elevated liver enzymes He has chronic elevated bilirubin.  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  We will observe   No orders of the defined types were placed in this encounter.  All questions were answered. The  patient knows to call the clinic with any problems, questions or concerns. No barriers to learning was detected. I spent 15 minutes counseling the patient face to face. The total time spent in the appointment was 20 minutes and more than 50% was on counseling and review of test results     [name], MD 11/23/2016 9:20 AM

## 2016-11-23 NOTE — Assessment & Plan Note (Signed)
He has chronic elevated bilirubin.  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  We will observe 

## 2017-03-29 ENCOUNTER — Telehealth: Payer: Self-pay | Admitting: Family Medicine

## 2017-03-29 DIAGNOSIS — Z1211 Encounter for screening for malignant neoplasm of colon: Secondary | ICD-10-CM

## 2017-03-29 NOTE — Telephone Encounter (Signed)
Patient is calling wanting a referral for a colonoscopy if possible [phone]

## 2017-03-30 NOTE — Telephone Encounter (Signed)
Referral placed.

## 2017-04-06 ENCOUNTER — Encounter: Payer: Self-pay | Admitting: Gastroenterology

## 2017-05-10 ENCOUNTER — Other Ambulatory Visit: Payer: Self-pay | Admitting: Hematology and Oncology

## 2017-05-10 ENCOUNTER — Telehealth: Payer: Self-pay | Admitting: Hematology and Oncology

## 2017-05-10 ENCOUNTER — Ambulatory Visit (HOSPITAL_BASED_OUTPATIENT_CLINIC_OR_DEPARTMENT_OTHER): Payer: 59 | Admitting: Hematology and Oncology

## 2017-05-10 ENCOUNTER — Other Ambulatory Visit (HOSPITAL_BASED_OUTPATIENT_CLINIC_OR_DEPARTMENT_OTHER): Payer: 59

## 2017-05-10 ENCOUNTER — Encounter: Payer: Self-pay | Admitting: Hematology and Oncology

## 2017-05-10 DIAGNOSIS — Z8571 Personal history of Hodgkin lymphoma: Secondary | ICD-10-CM

## 2017-05-10 DIAGNOSIS — R748 Abnormal levels of other serum enzymes: Secondary | ICD-10-CM

## 2017-05-10 DIAGNOSIS — D696 Thrombocytopenia, unspecified: Secondary | ICD-10-CM | POA: Diagnosis not present

## 2017-05-10 LAB — COMPREHENSIVE METABOLIC PANEL
ALK PHOS: 65 U/L (ref 40–150)
ALT: 24 U/L (ref 0–55)
ANION GAP: 8 meq/L (ref 3–11)
AST: 25 U/L (ref 5–34)
Albumin: 3.9 g/dL (ref 3.5–5.0)
BUN: 17.8 mg/dL (ref 7.0–26.0)
CALCIUM: 9.8 mg/dL (ref 8.4–10.4)
CO2: 27 mEq/L (ref 22–29)
Chloride: 106 mEq/L (ref 98–109)
Creatinine: 1.2 mg/dL (ref 0.7–1.3)
EGFR: 70 mL/min/{1.73_m2} — ABNORMAL LOW (ref >90)
Glucose: 80 mg/dl (ref 70–140)
POTASSIUM: 4.3 meq/L (ref 3.5–5.1)
Sodium: 141 mEq/L (ref 136–145)
Total Bilirubin: 1.71 mg/dL — ABNORMAL HIGH (ref 0.20–1.20)
Total Protein: 6.8 g/dL (ref 6.4–8.3)

## 2017-05-10 LAB — CBC WITH DIFFERENTIAL/PLATELET
BASO%: 1 % (ref 0.0–2.0)
BASOS ABS: 0.1 10*3/uL (ref 0.0–0.1)
EOS%: 4.6 % (ref 0.0–7.0)
Eosinophils Absolute: 0.3 10*3/uL (ref 0.0–0.5)
HEMATOCRIT: 44.6 % (ref 38.4–49.9)
HEMOGLOBIN: 15.2 g/dL (ref 13.0–17.1)
LYMPH%: 26.8 % (ref 14.0–49.0)
MCH: 31.2 pg (ref 27.2–33.4)
MCHC: 34.1 g/dL (ref 32.0–36.0)
MCV: 91.4 fL (ref 79.3–98.0)
MONO#: 0.4 10*3/uL (ref 0.1–0.9)
MONO%: 7 % (ref 0.0–14.0)
NEUT#: 3.8 10*3/uL (ref 1.5–6.5)
NEUT%: 60.6 % (ref 39.0–75.0)
Platelets: 138 10*3/uL — ABNORMAL LOW (ref 140–400)
RBC: 4.87 10*6/uL (ref 4.20–5.82)
RDW: 12.8 % (ref 11.0–14.6)
WBC: 6.3 10*3/uL (ref 4.0–10.3)
lymph#: 1.7 10*3/uL (ref 0.9–3.3)

## 2017-05-10 NOTE — Assessment & Plan Note (Signed)
His last CT scan on November 22, 2016 showed no evidence of active disease. I recommend repeat blood work history and examination in 6 months. I would discontinue surveillance imaging We discussed cancer survivorship issues and I recommend he makes an appointment to see his primary care doctor to resume routine surveillance program. We also discussed the importance of screening colonoscopy and other age-appropriate screening modalities

## 2017-05-10 NOTE — Telephone Encounter (Signed)
Gave patient avs and calendar for Feb 2019

## 2017-05-10 NOTE — Progress Notes (Signed)
[facility] OFFICE PROGRESS NOTE  Patient Care Team: [name], MD as PCP - General (Family Medicine)  SUMMARY OF ONCOLOGIC HISTORY: Oncology History   Hodgkin lymphoma   Staging form: Lymphoid Neoplasms, AJCC 6th Edition     Clinical stage from 09/13/2014: Stage III - Signed by [name], MD on 09/13/2014       History of Hodgkin's lymphoma   09/04/2014 Pathology Results    Accession: [id number] inguinal lymph node biopsy confirmed Hodgkin lymphoma      09/04/2014 Procedure    Dr. Dalbert Batman performed excision of left inguinal lymph node biopsy      09/11/2014 Imaging    CT scan of the chest, abdomen and pelvis show disease both above and below the diaphragm      09/16/2014 Imaging    Echocardiogram is negative      09/18/2014 Bone Marrow Biopsy    Bone marrow aspirate and biopsy is negative      09/22/2014 - 03/03/2015 Chemotherapy    Patient completed 6 cycles of ABVD. Bleomycin was discontinued in March 2016      10/06/2014 Adverse Reaction     treatment was delayed due to severe leukopenia. Future dose modification was made for abnormal liver function tests, leukopenia and neuropathy      11/23/2014 Imaging    PET CT scan showed near complete response to Rx      12/23/2014 Adverse Reaction    Bleomycin was discontinued due to suspected bleomycin toxicity      04/25/2015 Imaging    US venous Doppller left upper extremity showed acute DVT noted in left internal jugular vein. All other veins appear thrombus free      04/27/2015 Surgery    He had port removal.      05/04/2015 Imaging    PET CT scan showed no evidence of disease.      11/15/2015 Imaging    Stable mild right internal mammary lymphadenopathy with mild hypermetabolic activity, Deauville score 2.      05/15/2016 Imaging    Ct scan showed no new or progressive lymphoma in the chest, abdomen or pelvis. Stable mildly enlarged right internal mammary lymph node, suggesting treated  lymphoma      11/20/2016 Imaging    Stable 10 mm right internal mammary node, unchanged, likely related to treated lymphoma. No findings suggestive of active lymphoma in the chest, abdomen, or pelvis.       INTERVAL HISTORY: Please see below for problem oriented charting. He returns for further follow-up No new lymphadenopathy Denies recent infection No abnormal anorexia, weight loss or night sweats REVIEW OF SYSTEMS:   Constitutional: Denies fevers, chills or abnormal weight loss Eyes: Denies blurriness of vision Ears, nose, mouth, throat, and face: Denies mucositis or sore throat Respiratory: Denies cough, dyspnea or wheezes Cardiovascular: Denies palpitation, chest discomfort or lower extremity swelling Gastrointestinal:  Denies nausea, heartburn or change in bowel habits Skin: Denies abnormal skin rashes Lymphatics: Denies new lymphadenopathy or easy bruising Neurological:Denies numbness, tingling or new weaknesses Behavioral/Psych: Mood is stable, no new changes  All other systems were reviewed with the patient and are negative.  I have reviewed the past medical history, past surgical history, social history and family history with the patient and they are unchanged from previous note.  ALLERGIES:  has No Known Allergies.  MEDICATIONS:  Current Outpatient Prescriptions  Medication Sig Dispense Refill  . atorvastatin (LIPITOR) 40 MG tablet Take 1 tablet (40 mg total) by mouth daily.  90 tablet 3  . cholecalciferol (VITAMIN D) 1000 units tablet Take 2,000 Units by mouth daily.      No current facility-administered medications for this visit.     PHYSICAL EXAMINATION: ECOG PERFORMANCE STATUS: 0 - Asymptomatic  Vitals:   05/10/17 0832  BP: 128/65  Pulse: (!) 56  Resp: 18  Temp: 98 F (36.7 C)  SpO2: 100%   Filed Weights   05/10/17 0832  Weight: 203 lb 9.6 oz (92.4 kg)    GENERAL:alert, no distress and comfortable SKIN: skin color, texture, turgor are normal,  no rashes or significant lesions EYES: normal, Conjunctiva are pink and non-injected, sclera clear OROPHARYNX:no exudate, no erythema and lips, buccal mucosa, and tongue normal  NECK: supple, thyroid normal size, non-tender, without nodularity LYMPH:  no palpable lymphadenopathy in the cervical, axillary or inguinal LUNGS: clear to auscultation and percussion with normal breathing effort HEART: regular rate & rhythm and no murmurs and no lower extremity edema ABDOMEN:abdomen soft, non-tender and normal bowel sounds Musculoskeletal:no cyanosis of digits and no clubbing  NEURO: alert & oriented x 3 with fluent speech, no focal motor/sensory deficits  LABORATORY DATA:  I have reviewed the data as listed    Component Value Date/Time   NA 141 05/10/2017 0822   K 4.3 05/10/2017 0822   CL 101 07/06/2016 1641   CO2 27 05/10/2017 0822   GLUCOSE 80 05/10/2017 0822   BUN 17.8 05/10/2017 0822   CREATININE 1.2 05/10/2017 0822   CALCIUM 9.8 05/10/2017 0822   PROT 6.8 05/10/2017 0822   ALBUMIN 3.9 05/10/2017 0822   AST 25 05/10/2017 0822   ALT 24 05/10/2017 0822   ALKPHOS 65 05/10/2017 0822   BILITOT 1.71 (H) 05/10/2017 0822   GFRNONAA 63 07/06/2016 1641   GFRAA 73 07/06/2016 1641    No results found for: SPEP, UPEP  Lab Results  Component Value Date   WBC 6.3 05/10/2017   NEUTROABS 3.8 05/10/2017   HGB 15.2 05/10/2017   HCT 44.6 05/10/2017   MCV 91.4 05/10/2017   PLT 138 (L) 05/10/2017      Chemistry      Component Value Date/Time   NA 141 05/10/2017 0822   K 4.3 05/10/2017 0822   CL 101 07/06/2016 1641   CO2 27 05/10/2017 0822   BUN 17.8 05/10/2017 0822   CREATININE 1.2 05/10/2017 0822      Component Value Date/Time   CALCIUM 9.8 05/10/2017 0822   ALKPHOS 65 05/10/2017 0822   AST 25 05/10/2017 0822   ALT 24 05/10/2017 0822   BILITOT 1.71 (H) 05/10/2017 0822     ASSESSMENT & PLAN:  History of hodgkin's lymphoma His last CT scan on November 22, 2016 showed no  evidence of active disease. I recommend repeat blood work history and examination in 6 months. I would discontinue surveillance imaging We discussed cancer survivorship issues and I recommend he makes an appointment to see his primary care doctor to resume routine surveillance program. We also discussed the importance of screening colonoscopy and other age-appropriate screening modalities  Elevated liver enzymes He has chronic elevated bilirubin.  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  We will observe  Thrombocytopenia ([place]) The cause could be related to his underlying medical condition. It is mild and there is little change compared from previous platelet count. The patient denies recent history of bleeding such as epistaxis, hematuria or hematochezia. He is asymptomatic from the thrombocytopenia. I will observe for now.     No orders  of the defined types were placed in this encounter.  All questions were answered. The patient knows to call the clinic with any problems, questions or concerns. No barriers to learning was detected. I spent 10 minutes counseling the patient face to face. The total time spent in the appointment was 15 minutes and more than 50% was on counseling and review of test results     [name], MD 05/10/2017 9:59 AM

## 2017-05-10 NOTE — Assessment & Plan Note (Signed)
He has chronic elevated bilirubin.  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  We will observe

## 2017-05-10 NOTE — Assessment & Plan Note (Signed)
The cause could be related to his underlying medical condition. It is mild and there is little change compared from previous platelet count. The patient denies recent history of bleeding such as epistaxis, hematuria or hematochezia. He is asymptomatic from the thrombocytopenia. I will observe for now.  

## 2017-05-23 ENCOUNTER — Ambulatory Visit (INDEPENDENT_AMBULATORY_CARE_PROVIDER_SITE_OTHER): Payer: 59

## 2017-05-23 ENCOUNTER — Ambulatory Visit (INDEPENDENT_AMBULATORY_CARE_PROVIDER_SITE_OTHER): Payer: 59 | Admitting: Orthopaedic Surgery

## 2017-05-23 ENCOUNTER — Encounter (INDEPENDENT_AMBULATORY_CARE_PROVIDER_SITE_OTHER): Payer: Self-pay | Admitting: Orthopaedic Surgery

## 2017-05-23 VITALS — BP 116/69 | HR 66 | Ht 70.0 in | Wt 200.0 lb

## 2017-05-23 DIAGNOSIS — S8001XA Contusion of right knee, initial encounter: Secondary | ICD-10-CM

## 2017-05-23 DIAGNOSIS — M25561 Pain in right knee: Secondary | ICD-10-CM | POA: Diagnosis not present
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
## 2017-05-28 ENCOUNTER — Ambulatory Visit (AMBULATORY_SURGERY_CENTER): Payer: Self-pay

## 2017-05-28 VITALS — Ht 70.0 in | Wt 203.0 lb

## 2017-05-28 DIAGNOSIS — Z1211 Encounter for screening for malignant neoplasm of colon: Secondary | ICD-10-CM

## 2017-05-28 MED ORDER — SUPREP BOWEL PREP KIT 17.5-3.13-1.6 GM/177ML PO SOLN: 1.0000 | Freq: Once | ORAL | 0 refills | Status: AC

## 2017-05-28 NOTE — Progress Notes (Signed)
No diet meds No home oxygen No past problems with anesthesia No allergies to eggs or soy  Registered emmi

## 2017-05-31 ENCOUNTER — Encounter: Payer: Self-pay | Admitting: Gastroenterology

## 2017-06-11 ENCOUNTER — Ambulatory Visit (AMBULATORY_SURGERY_CENTER): Payer: 59 | Admitting: Gastroenterology

## 2017-06-11 ENCOUNTER — Encounter: Payer: Self-pay | Admitting: Gastroenterology

## 2017-06-11 VITALS — BP 109/68 | HR 51 | Temp 96.9°F | Resp 12 | Ht 70.0 in | Wt 200.0 lb

## 2017-06-11 DIAGNOSIS — Z1212 Encounter for screening for malignant neoplasm of rectum: Secondary | ICD-10-CM | POA: Diagnosis not present

## 2017-06-11 DIAGNOSIS — Z1211 Encounter for screening for malignant neoplasm of colon: Secondary | ICD-10-CM | POA: Diagnosis present

## 2017-06-11 MED ORDER — SODIUM CHLORIDE 0.9 % IV SOLN: 500.0000 mL | INTRAVENOUS | Status: DC

## 2017-06-11 NOTE — Op Note (Signed)
[facility] Patient Name: Keith Forbes Procedure Date: 06/11/2017 8:01 AM MRN: [id number] Endoscopist: Remo Lipps P. Ashlon Lottman MD, MD Age: 51 Referring MD:  Date of Birth: [birth year] Gender: Male Account #: [id number] Procedure:                Colonoscopy Indications:              Screening for colorectal malignant neoplasm, This                            is the patient's first colonoscopy Medicines:                Monitored Anesthesia Care Procedure:                Pre-Anesthesia Assessment:                           - Prior to the procedure, a History and Physical                            was performed, and patient medications and                            allergies were reviewed. The patient's tolerance of                            previous anesthesia was also reviewed. The risks                            and benefits of the procedure and the sedation                            options and risks were discussed with the patient.                            All questions were answered, and informed consent                            was obtained. Prior Anticoagulants: The patient has                            taken no previous anticoagulant or antiplatelet                            agents. ASA Grade Assessment: II - A patient with                            mild systemic disease. After reviewing the risks                            and benefits, the patient was deemed in                            satisfactory condition to undergo the procedure.  After obtaining informed consent, the colonoscope                            was passed under direct vision. Throughout the                            procedure, the patient's blood pressure, pulse, and                            oxygen saturations were monitored continuously. The                            Model CF-HQ190L [id number]) scope was introduced                            through the anus and  advanced to the the cecum,                            identified by appendiceal orifice and ileocecal                            valve. The colonoscopy was performed without                            difficulty. The patient tolerated the procedure                            well. The quality of the bowel preparation was                            good. The ileocecal valve, appendiceal orifice, and                            rectum were photographed. Scope In: 8:04:20 AM Scope Out: 8:22:00 AM Scope Withdrawal Time: 0 hours 13 minutes 29 seconds  Total Procedure Duration: 0 hours 17 minutes 40 seconds  Findings:                 The perianal and digital rectal examinations were                            normal.                           Internal hemorrhoids were found during                            retroflexion. The hemorrhoids were small.                           The exam was otherwise without abnormality. Complications:            No immediate complications. Estimated blood loss:                            None. Estimated Blood Loss:  Estimated blood loss: none. Impression:               - Internal hemorrhoids.                           - The examination was otherwise normal. No polyps                           - No specimens collected. Recommendation:           - Patient has a contact number available for                            emergencies. The signs and symptoms of potential                            delayed complications were discussed with the                            patient. Return to normal activities tomorrow.                            Written discharge instructions were provided to the                            patient.                           - Resume previous diet.                           - Continue present medications.                           - Repeat colonoscopy in 10 years for screening                            purposes. Remo Lipps P. Blythe Veach MD,  MD 06/11/2017 8:24:27 AM This report has been signed electronically.

## 2017-06-11 NOTE — Progress Notes (Signed)
Report given to PACU, vss 

## 2017-06-11 NOTE — Patient Instructions (Signed)
YOU HAD AN ENDOSCOPIC PROCEDURE TODAY AT [facility]:   Refer to the procedure report that was given to you for any specific questions about what was found during the examination.  If the procedure report does not answer your questions, please call your gastroenterologist to clarify.  If you requested that your care partner not be given the details of your procedure findings, then the procedure report has been included in a sealed envelope for you to review at your convenience later.  YOU SHOULD EXPECT: Some feelings of bloating in the abdomen. Passage of more gas than usual.  Walking can help get rid of the air that was put into your GI tract during the procedure and reduce the bloating. If you had a lower endoscopy (such as a colonoscopy or flexible sigmoidoscopy) you may notice spotting of blood in your stool or on the toilet paper. If you underwent a bowel prep for your procedure, you may not have a normal bowel movement for a few days.  Please Note:  You might notice some irritation and congestion in your nose or some drainage.  This is from the oxygen used during your procedure.  There is no need for concern and it should clear up in a day or so.  SYMPTOMS TO REPORT IMMEDIATELY:   Following lower endoscopy (colonoscopy or flexible sigmoidoscopy):  Excessive amounts of blood in the stool  Significant tenderness or worsening of abdominal pains  Swelling of the abdomen that is new, acute  Fever of 100F or higher   For urgent or emergent issues, a gastroenterologist can be reached at any hour by calling [phone].   DIET:  We do recommend a small meal at first, but then you may proceed to your regular diet.  Drink plenty of fluids but you should avoid alcoholic beverages for 24 hours.  ACTIVITY:  You should plan to take it easy for the rest of today and you should NOT DRIVE or use heavy machinery until tomorrow (because of the sedation medicines used during the test).     FOLLOW UP: Our staff will call the number listed on your records the next business day following your procedure to check on you and address any questions or concerns that you may have regarding the information given to you following your procedure. If we do not reach you, we will leave a message.  However, if you are feeling well and you are not experiencing any problems, there is no need to return our call.  We will assume that you have returned to your regular daily activities without incident.  If any biopsies were taken you will be contacted by phone or by letter within the next 1-3 weeks.  Please call us at [phone] if you have not heard about the biopsies in 3 weeks.    SIGNATURES/CONFIDENTIALITY: You and/or your care partner have signed paperwork which will be entered into your electronic medical record.  These signatures attest to the fact that that the information above on your After Visit Summary has been reviewed and is understood.  Full responsibility of the confidentiality of this discharge information lies with you and/or your care-partner.  We will see you again in 10 years!

## 2017-06-11 NOTE — Progress Notes (Signed)
Pt's states no medical or surgical changes since previsit or office visit. 

## 2017-06-12 ENCOUNTER — Telehealth: Payer: Self-pay

## 2017-06-12 NOTE — Telephone Encounter (Signed)
  Follow up Call-  Call back number 06/11/2017  Post procedure Call Back phone  # [phone]  Permission to leave phone message Yes  Some recent data might be hidden     Left message

## 2017-07-29 ENCOUNTER — Other Ambulatory Visit: Payer: Self-pay | Admitting: Family Medicine

## 2017-11-08 ENCOUNTER — Other Ambulatory Visit: Payer: Self-pay | Admitting: Hematology and Oncology

## 2017-11-08 DIAGNOSIS — Z8571 Personal history of Hodgkin lymphoma: Secondary | ICD-10-CM

## 2017-11-09 ENCOUNTER — Encounter: Payer: Self-pay | Admitting: Hematology and Oncology

## 2017-11-09 ENCOUNTER — Inpatient Hospital Stay: Payer: 59 | Attending: Hematology and Oncology | Admitting: Hematology and Oncology

## 2017-11-09 ENCOUNTER — Inpatient Hospital Stay: Payer: 59

## 2017-11-09 DIAGNOSIS — Z8571 Personal history of Hodgkin lymphoma: Secondary | ICD-10-CM

## 2017-11-09 DIAGNOSIS — Z79899 Other long term (current) drug therapy: Secondary | ICD-10-CM | POA: Insufficient documentation

## 2017-11-09 DIAGNOSIS — R17 Unspecified jaundice: Secondary | ICD-10-CM

## 2017-11-09 DIAGNOSIS — Z7982 Long term (current) use of aspirin: Secondary | ICD-10-CM

## 2017-11-09 DIAGNOSIS — R748 Abnormal levels of other serum enzymes: Secondary | ICD-10-CM | POA: Insufficient documentation

## 2017-11-09 LAB — CBC WITH DIFFERENTIAL/PLATELET
BASOS ABS: 0.1 10*3/uL (ref 0.0–0.1)
Basophils Relative: 1 %
EOS PCT: 3 %
Eosinophils Absolute: 0.2 10*3/uL (ref 0.0–0.5)
HCT: 46.4 % (ref 38.4–49.9)
HEMOGLOBIN: 15.4 g/dL (ref 13.0–17.1)
LYMPHS ABS: 1.9 10*3/uL (ref 0.9–3.3)
Lymphocytes Relative: 26 %
MCH: 30.3 pg (ref 27.2–33.4)
MCHC: 33.2 g/dL (ref 32.0–36.0)
MCV: 91.3 fL (ref 79.3–98.0)
Monocytes Absolute: 0.6 10*3/uL (ref 0.1–0.9)
Monocytes Relative: 8 %
NEUTROS PCT: 62 %
Neutro Abs: 4.6 10*3/uL (ref 1.5–6.5)
PLATELETS: 147 10*3/uL (ref 140–400)
RBC: 5.08 MIL/uL (ref 4.20–5.82)
RDW: 12.5 % (ref 11.0–14.6)
WBC: 7.3 10*3/uL (ref 4.0–10.3)

## 2017-11-09 LAB — COMPREHENSIVE METABOLIC PANEL
ALK PHOS: 69 U/L (ref 40–150)
ALT: 47 U/L (ref 0–55)
AST: 37 U/L — AB (ref 5–34)
Albumin: 4.3 g/dL (ref 3.5–5.0)
Anion gap: 8 (ref 3–11)
BUN: 15 mg/dL (ref 7–26)
CALCIUM: 9.5 mg/dL (ref 8.4–10.4)
CHLORIDE: 104 mmol/L (ref 98–109)
CO2: 27 mmol/L (ref 22–29)
CREATININE: 1.21 mg/dL (ref 0.70–1.30)
GFR calc Af Amer: >60 mL/min (ref >60)
GFR calc non Af Amer: >60 mL/min (ref >60)
Glucose, Bld: 86 mg/dL (ref 70–140)
Potassium: 4.2 mmol/L (ref 3.5–5.1)
Sodium: 139 mmol/L (ref 136–145)
Total Bilirubin: 1.7 mg/dL — ABNORMAL HIGH (ref 0.2–1.2)
Total Protein: 7.1 g/dL (ref 6.4–8.3)

## 2017-11-09 LAB — LACTATE DEHYDROGENASE: LDH: 171 U/L (ref 125–245)

## 2017-11-09 NOTE — Assessment & Plan Note (Signed)
His last CT scan on November 22, 2016 showed no evidence of active disease. I recommend repeat blood work history and examination in 6 months. I would discontinue surveillance imaging We discussed cancer survivorship issues

## 2017-11-09 NOTE — Assessment & Plan Note (Signed)
He has chronic elevated bilirubin.  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  The mildly elevated liver enzymes is likely due to muscle activity. We will observe

## 2017-11-09 NOTE — Progress Notes (Signed)
[facility] OFFICE PROGRESS NOTE  Patient Care Team: Keith Frizzle, MD as PCP - General (Family Medicine)  SUMMARY OF ONCOLOGIC HISTORY: Oncology History   Hodgkin lymphoma   Staging form: Lymphoid Neoplasms, AJCC 6th Edition     Clinical stage from 09/13/2014: Stage III - Signed by [name], MD on 09/13/2014       History of Hodgkin's lymphoma   09/04/2014 Pathology Results    Accession: [id number] inguinal lymph node biopsy confirmed Hodgkin lymphoma      09/04/2014 Procedure    Dr. Dalbert Batman performed excision of left inguinal lymph node biopsy      09/11/2014 Imaging    CT scan of the chest, abdomen and pelvis show disease both above and below the diaphragm      09/16/2014 Imaging    Echocardiogram is negative      09/18/2014 Bone Marrow Biopsy    Bone marrow aspirate and biopsy is negative      09/22/2014 - 03/03/2015 Chemotherapy    Patient completed 6 cycles of ABVD. Bleomycin was discontinued in March 2016      10/06/2014 Adverse Reaction     treatment was delayed due to severe leukopenia. Future dose modification was made for abnormal liver function tests, leukopenia and neuropathy      11/23/2014 Imaging    PET CT scan showed near complete response to Rx      12/23/2014 Adverse Reaction    Bleomycin was discontinued due to suspected bleomycin toxicity      04/25/2015 Imaging    US venous Doppller left upper extremity showed acute DVT noted in left internal jugular vein. All other veins appear thrombus free      04/27/2015 Surgery    He had port removal.      05/04/2015 Imaging    PET CT scan showed no evidence of disease.      11/15/2015 Imaging    Stable mild right internal mammary lymphadenopathy with mild hypermetabolic activity, Deauville score 2.      05/15/2016 Imaging    Ct scan showed no new or progressive lymphoma in the chest, abdomen or pelvis. Stable mildly enlarged right internal mammary lymph node, suggesting treated  lymphoma      11/20/2016 Imaging    Stable 10 mm right internal mammary node, unchanged, likely related to treated lymphoma. No findings suggestive of active lymphoma in the chest, abdomen, or pelvis.       INTERVAL HISTORY: Please see below for problem oriented charting. He returns with his wife for further follow-up He feels well He has no new lymphadenopathy.  No recent infection.  REVIEW OF SYSTEMS:   Constitutional: Denies fevers, chills or abnormal weight loss Eyes: Denies blurriness of vision Ears, nose, mouth, throat, and face: Denies mucositis or sore throat Respiratory: Denies cough, dyspnea or wheezes Cardiovascular: Denies palpitation, chest discomfort or lower extremity swelling Gastrointestinal:  Denies nausea, heartburn or change in bowel habits Skin: Denies abnormal skin rashes Lymphatics: Denies new lymphadenopathy or easy bruising Neurological:Denies numbness, tingling or new weaknesses Behavioral/Psych: Mood is stable, no new changes  All other systems were reviewed with the patient and are negative.  I have reviewed the past medical history, past surgical history, social history and family history with the patient and they are unchanged from previous note.  ALLERGIES:  has No Known Allergies.  MEDICATIONS:  Current Outpatient Medications  Medication Sig Dispense Refill  . aspirin EC 81 MG tablet Take 81 mg by mouth daily.    Marland Kitchen  atorvastatin (LIPITOR) 40 MG tablet TAKE 1 TABLET BY MOUTH EVERY DAY 90 tablet 2  . cholecalciferol (VITAMIN D) 1000 units tablet Take 2,000 Units by mouth daily.     . Multiple Vitamin (MULTIVITAMIN) tablet Take 1 tablet by mouth daily. occasional     No current facility-administered medications for this visit.     PHYSICAL EXAMINATION: ECOG PERFORMANCE STATUS: 1 - Symptomatic but completely ambulatory  Vitals:   11/09/17 0843  BP: 117/69  Pulse: (!) 57  Resp: 18  Temp: 98.4 F (36.9 C)  SpO2: 100%   Filed Weights    11/09/17 0843  Weight: 203 lb 8 oz (92.3 kg)    GENERAL:alert, no distress and comfortable SKIN: skin color, texture, turgor are normal, no rashes or significant lesions EYES: normal, Conjunctiva are pink and non-injected, sclera clear OROPHARYNX:no exudate, no erythema and lips, buccal mucosa, and tongue normal  NECK: supple, thyroid normal size, non-tender, without nodularity LYMPH:  no palpable lymphadenopathy in the cervical, axillary or inguinal LUNGS: clear to auscultation and percussion with normal breathing effort HEART: regular rate & rhythm and no murmurs and no lower extremity edema ABDOMEN:abdomen soft, non-tender and normal bowel sounds Musculoskeletal:no cyanosis of digits and no clubbing  NEURO: alert & oriented x 3 with fluent speech, no focal motor/sensory deficits  LABORATORY DATA:  I have reviewed the data as listed    Component Value Date/Time   NA 139 11/09/2017 0801   NA 141 05/10/2017 0822   K 4.2 11/09/2017 0801   K 4.3 05/10/2017 0822   CL 104 11/09/2017 0801   CO2 27 11/09/2017 0801   CO2 27 05/10/2017 0822   GLUCOSE 86 11/09/2017 0801   GLUCOSE 80 05/10/2017 0822   BUN 15 11/09/2017 0801   BUN 17.8 05/10/2017 0822   CREATININE 1.21 11/09/2017 0801   CREATININE 1.2 05/10/2017 0822   CALCIUM 9.5 11/09/2017 0801   CALCIUM 9.8 05/10/2017 0822   PROT 7.1 11/09/2017 0801   PROT 6.8 05/10/2017 0822   ALBUMIN 4.3 11/09/2017 0801   ALBUMIN 3.9 05/10/2017 0822   AST 37 (H) 11/09/2017 0801   AST 25 05/10/2017 0822   ALT 47 11/09/2017 0801   ALT 24 05/10/2017 0822   ALKPHOS 69 11/09/2017 0801   ALKPHOS 65 05/10/2017 0822   BILITOT 1.7 (H) 11/09/2017 0801   BILITOT 1.71 (H) 05/10/2017 0822   GFRNONAA >60 11/09/2017 0801   GFRNONAA 63 07/06/2016 1641   GFRAA >60 11/09/2017 0801   GFRAA 73 07/06/2016 1641    No results found for: SPEP, UPEP  Lab Results  Component Value Date   WBC 7.3 11/09/2017   NEUTROABS 4.6 11/09/2017   HGB 15.4 11/09/2017    HCT 46.4 11/09/2017   MCV 91.3 11/09/2017   PLT 147 11/09/2017      Chemistry      Component Value Date/Time   NA 139 11/09/2017 0801   NA 141 05/10/2017 0822   K 4.2 11/09/2017 0801   K 4.3 05/10/2017 0822   CL 104 11/09/2017 0801   CO2 27 11/09/2017 0801   CO2 27 05/10/2017 0822   BUN 15 11/09/2017 0801   BUN 17.8 05/10/2017 0822   CREATININE 1.21 11/09/2017 0801   CREATININE 1.2 05/10/2017 0822      Component Value Date/Time   CALCIUM 9.5 11/09/2017 0801   CALCIUM 9.8 05/10/2017 0822   ALKPHOS 69 11/09/2017 0801   ALKPHOS 65 05/10/2017 0822   AST 37 (H) 11/09/2017 0801   AST 25  05/10/2017 0822   ALT 47 11/09/2017 0801   ALT 24 05/10/2017 0822   BILITOT 1.7 (H) 11/09/2017 0801   BILITOT 1.71 (H) 05/10/2017 0822       ASSESSMENT & PLAN:  History of hodgkin's lymphoma His last CT scan on November 22, 2016 showed no evidence of active disease. I recommend repeat blood work history and examination in 6 months. I would discontinue surveillance imaging We discussed cancer survivorship issues   Elevated liver enzymes He has chronic elevated bilirubin.  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  The mildly elevated liver enzymes is likely due to muscle activity. We will observe   No orders of the defined types were placed in this encounter.  All questions were answered. The patient knows to call the clinic with any problems, questions or concerns. No barriers to learning was detected. I spent 10 minutes counseling the patient face to face. The total time spent in the appointment was 15 minutes and more than 50% was on counseling and review of test results     [name], MD 11/09/2017 9:43 AM

## 2018-01-07 ENCOUNTER — Ambulatory Visit: Payer: 59 | Admitting: Physician Assistant

## 2018-01-07 ENCOUNTER — Other Ambulatory Visit: Payer: Self-pay

## 2018-01-07 ENCOUNTER — Encounter: Payer: Self-pay | Admitting: Family Medicine

## 2018-01-07 ENCOUNTER — Encounter: Payer: Self-pay | Admitting: Physician Assistant

## 2018-01-07 VITALS — BP 116/64 | HR 89 | Temp 99.4°F | Resp 16 | Ht 70.5 in | Wt 205.0 lb

## 2018-01-07 DIAGNOSIS — B9689 Other specified bacterial agents as the cause of diseases classified elsewhere: Secondary | ICD-10-CM

## 2018-01-07 DIAGNOSIS — J988 Other specified respiratory disorders: Secondary | ICD-10-CM | POA: Diagnosis not present

## 2018-01-07 MED ORDER — AZITHROMYCIN 250 MG PO TABS: ORAL_TABLET | ORAL | 0 refills | Status: DC

## 2018-01-07 NOTE — Progress Notes (Signed)
    Patient ID: Keith Forbes MRN: [id number], DOB: 1966/01/03, 52 y.o. Date of Encounter: 01/07/2018, 3:49 PM    Chief Complaint:  Chief Complaint  Patient presents with  . Headache    started  4/4  . Cough    taking otc meds mucinex, robtussin dm  . Nasal Congestion  . nasal drainage     HPI: 52 y.o. year old male presents with above.  Reports that he has been having nasal congestion and nasal drainage with thick dark mucus.  Also having some chest congestion and cough with thick dark phlegm.  Throat has felt a little sore first thing in the morning but then that resolves.  Has had no known fevers or chills.     Home Meds:   Outpatient Medications Prior to Visit  Medication Sig Dispense Refill  . aspirin EC 81 MG tablet Take 81 mg by mouth daily.    Marland Kitchen atorvastatin (LIPITOR) 40 MG tablet TAKE 1 TABLET BY MOUTH EVERY DAY 90 tablet 2  . cholecalciferol (VITAMIN D) 1000 units tablet Take 2,000 Units by mouth daily.     . Multiple Vitamin (MULTIVITAMIN) tablet Take 1 tablet by mouth daily. occasional     No facility-administered medications prior to visit.     Allergies: No Known Allergies    Review of Systems: See HPI for pertinent ROS. All other ROS negative.    Physical Exam: Blood pressure 116/64, pulse 89, temperature 99.4 F (37.4 C), temperature source Oral, resp. rate 16, height 5' 10.5" (1.791 m), weight 93 kg (205 lb), SpO2 96 %., Body mass index is 29 kg/m. General: WNWD WM.  Appears in no acute distress. HEENT: Normocephalic, atraumatic, eyes without discharge, sclera non-icteric, nares are without discharge. Bilateral auditory canals clear, TM's are without perforation, pearly grey and translucent with reflective cone of light bilaterally. Oral cavity moist, posterior pharynx without exudate, erythema, peritonsillar abscess.  No tenderness with percussion to frontal or maxillary sinuses bilaterally.  Neck: Supple. No thyromegaly. No lymphadenopathy. Lungs:  Clear bilaterally to auscultation without wheezes, rales, or rhonchi. Breathing is unlabored. Heart: Regular rhythm. No murmurs, rubs, or gallops. Msk:  Strength and tone normal for age. Extremities/Skin: Warm and dry.  Neuro: Alert and oriented X 3. Moves all extremities spontaneously. Gait is normal. CNII-XII grossly in tact. Psych:  Responds to questions appropriately with a normal affect.     ASSESSMENT AND PLAN:  52 y.o. year old male with  1. Bacterial respiratory infection He is to take antibiotic as directed.  Follow-up if symptoms do not resolve within 1 week after completion of antibiotic.  Note given to cover out of work today and tomorrow with plans to return Wednesday, 01/09/18. - azithromycin (ZITHROMAX) 250 MG tablet; Day 1: Take 2 daily. Days 2 -5: Take 1 daily.  Dispense: 6 tablet; Refill: 0   Signed, [address] [place], [place], [facility] 01/07/2018 3:49 PM

## 2018-02-01 ENCOUNTER — Encounter: Payer: Self-pay | Admitting: Family Medicine

## 2018-02-01 ENCOUNTER — Ambulatory Visit: Payer: 59 | Admitting: Family Medicine

## 2018-02-01 VITALS — BP 122/70 | HR 72 | Temp 98.3°F | Resp 12 | Ht 70.0 in | Wt 205.0 lb

## 2018-02-01 DIAGNOSIS — J019 Acute sinusitis, unspecified: Secondary | ICD-10-CM | POA: Diagnosis not present

## 2018-02-01 DIAGNOSIS — B9689 Other specified bacterial agents as the cause of diseases classified elsewhere: Secondary | ICD-10-CM | POA: Diagnosis not present

## 2018-02-01 MED ORDER — AMOXICILLIN-POT CLAVULANATE 875-125 MG PO TABS: 1.0000 | ORAL_TABLET | Freq: Two times a day (BID) | ORAL | 0 refills | Status: DC

## 2018-02-01 MED ORDER — FLUTICASONE PROPIONATE 50 MCG/ACT NA SUSP: 2.0000 | Freq: Every day | NASAL | 6 refills | Status: DC

## 2018-02-01 NOTE — Progress Notes (Signed)
Subjective:    Patient ID: Keith Forbes, male    DOB: [birth year], 52 y.o.   MRN: [id number]  HPI Patient was seen 1 week ago by my partner and diagnosed with a sinus infection and was treated with Z-Pak.  He states that the Z-Pak helped however the symptoms never completely resolved.  Over the last 4 weeks he has had persistent pain and pressure in his right maxillary sinus and his right frontal sinus.  He reports sinus headache.  He reports pain in his upper right molars.  He reports subjective fevers.  He denies any postnasal drip or rhinorrhea or sore throat or cough. Past Medical History:  Diagnosis Date  . Bone lesion 05/05/2015  . Cancer [facility])    hodgkin lymphoma  . GERD (gastroesophageal reflux disease)   . Hernia   . Hodgkin lymphoma ([place]) 09/11/2014  . Hypercholesteremia   . Hyperlipidemia   . Lymphoma ([place])   . Pars defect of lumbar spine    bilateral 2013 (Dr. Sherwood Gambler)  . Wears contact lenses    Past Surgical History:  Procedure Laterality Date  . CLOSED REDUCTION HAND FRACTURE    . FINGER FRACTURE SURGERY    . LYMPH NODE BIOPSY  12/15  . PORT-A-CATH REMOVAL N/A 04/27/2015   Procedure: REMOVAL PORT-A-CATH;  Surgeon: Stark Klein, MD;  Location: [facility];  Service: General;  Laterality: N/A;  . PORTACATH PLACEMENT Left 09/15/2014   Procedure: INSERTION PORT-A-CATH WITH ULTRA SOUND;  Surgeon: Fanny Skates, MD;  Location: [facility];  Service: General;  Laterality: Left;   Current Outpatient Medications on File Prior to Visit  Medication Sig Dispense Refill  . aspirin EC 81 MG tablet Take 81 mg by mouth daily.    Marland Kitchen atorvastatin (LIPITOR) 40 MG tablet TAKE 1 TABLET BY MOUTH EVERY DAY 90 tablet 2  . cholecalciferol (VITAMIN D) 1000 units tablet Take 2,000 Units by mouth daily.     . Multiple Vitamin (MULTIVITAMIN) tablet Take 1 tablet by mouth daily. occasional     No current facility-administered medications on file prior to visit.    No Known  Allergies Social History   Socioeconomic History  . Marital status: Married    Spouse name: Not on file  . Number of children: Not on file  . Years of education: Not on file  . Highest education level: Not on file  Occupational History  . Not on file  Social Needs  . Financial resource strain: Not on file  . Food insecurity:    Worry: Not on file    Inability: Not on file  . Transportation needs:    Medical: Not on file    Non-medical: Not on file  Tobacco Use  . Smoking status: Never Smoker  . Smokeless tobacco: Never Used  Substance and Sexual Activity  . Alcohol use: Yes    Comment: once every 2-3 months or less  . Drug use: No  . Sexual activity: Not on file    Comment: married, works in emergency services  Lifestyle  . Physical activity:    Days per week: Not on file    Minutes per session: Not on file  . Stress: Not on file  Relationships  . Social connections:    Talks on phone: Not on file    Gets together: Not on file    Attends religious service: Not on file    Active member of club or organization: Not on file    Attends meetings  of clubs or organizations: Not on file    Relationship status: Not on file  . Intimate partner violence:    Fear of current or ex partner: Not on file    Emotionally abused: Not on file    Physically abused: Not on file    Forced sexual activity: Not on file  Other Topics Concern  . Not on file  Social History Narrative  . Not on file      Review of Systems  All other systems reviewed and are negative.      Objective:   Physical Exam  Constitutional: He appears well-developed and well-nourished. No distress.  HENT:  Head: Normocephalic and atraumatic.  Right Ear: External ear normal.  Left Ear: External ear normal.  Nose: No mucosal edema or rhinorrhea. Right sinus exhibits maxillary sinus tenderness and frontal sinus tenderness.  Mouth/Throat: Oropharynx is clear and moist. No oropharyngeal exudate.  Eyes: Right  eye exhibits no discharge. Left eye exhibits no discharge.  Neck: Normal range of motion.  Cardiovascular: Normal rate, regular rhythm and normal heart sounds.  No murmur heard. Pulmonary/Chest: Effort normal and breath sounds normal. No stridor. No respiratory distress. He has no wheezes. He has no rales.  Skin: He is not diaphoretic.  Vitals reviewed.         Assessment & Plan:  Acute bacterial rhinosinusitis  Begin Augmentin 875 mg p.o. twice daily for 10 days with Flonase 2 sprays each nostril twice daily.  If symptoms are not improving, consider short taper pack of prednisone and an extended course of antibiotics.

## 2018-02-26 ENCOUNTER — Other Ambulatory Visit: Payer: Self-pay

## 2018-02-26 ENCOUNTER — Ambulatory Visit: Payer: 59 | Admitting: Family Medicine

## 2018-02-26 ENCOUNTER — Encounter: Payer: Self-pay | Admitting: Family Medicine

## 2018-02-26 VITALS — BP 116/72 | HR 64 | Temp 98.3°F | Resp 15 | Ht 70.0 in | Wt 208.4 lb

## 2018-02-26 DIAGNOSIS — H938X2 Other specified disorders of left ear: Secondary | ICD-10-CM

## 2018-02-26 DIAGNOSIS — H6122 Impacted cerumen, left ear: Secondary | ICD-10-CM

## 2018-02-28 NOTE — Progress Notes (Signed)
Patient ID: Keith Forbes, male    DOB: 03-16-1966, 52 y.o.   MRN: [id number]  PCP: [name], MD  Chief Complaint  Patient presents with  . Ear Problem    Patient has concerns of clogged ears.     Subjective:   Keith Forbes is a 52 y.o. male, presents to clinic with CC of decreased hearing to his left ear, does have a history of cerumen impaction however he has not been having issues and has not had any procedures for about 5 years.  Does just feel blocked he has been using Q-tips.  He denies any pain, tinnitus, vertigo, drainage, swelling, redness.  Denies any recent URI symptoms, fevers or headaches.   Patient Active Problem List   Diagnosis Date Noted  . Thrombocytopenia ([place]) 05/10/2017  . Coronary artery calcification seen on CAT scan 05/16/2016  . Quality of life palliative care encounter 05/16/2016  . Abdominal pain 11/16/2015  . Poor memory 11/16/2015  . Bone lesion 05/05/2015  . Personal history of DVT (deep vein thrombosis) 04/26/2015  . Constipation, chronic 01/21/2015  . Headache disorder 12/23/2014  . Bronchitis 12/22/2014  . Neutropenia ([place]) 12/22/2014  . Blister of skin without infection 11/25/2014  . Weight loss 11/25/2014  . Mucositis due to chemotherapy 10/28/2014  . Leukopenia due to antineoplastic chemotherapy ([place]) 10/07/2014  . Elevated liver enzymes 10/07/2014  . 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 History of Hodgkin's lymphoma 09/11/2014  . Pars defect of lumbar spine   . Hypercholesteremia   . Inguinal hernia without mention of obstruction or gangrene, unilateral or unspecified, (not specified as recurrent) 04/17/2011     Prior to Admission medications   Medication Sig Start Date End Date Taking? Authorizing Provider  atorvastatin (LIPITOR) 40 MG tablet TAKE 1 TABLET BY MOUTH EVERY DAY 07/30/17  Yes [name], MD  cholecalciferol (VITAMIN D) 1000 units tablet Take 2,000 Units by mouth daily.    Yes  [provider]  Multiple Vitamin (MULTIVITAMIN) tablet Take 1 tablet by mouth daily. occasional   Yes [provider]  aspirin EC 81 MG tablet Take 81 mg by mouth daily.    [provider]  fluticasone (FLONASE) 50 MCG/ACT nasal spray Place 2 sprays into both nostrils daily. Patient not taking: Reported on 02/26/2018 02/01/18   [name], MD     No Known Allergies   Family History  Problem Relation Age of Onset  . Hyperlipidemia Father   . Heart disease Father   . Cancer Paternal Aunt        breast ca  . Colon cancer Neg Hx      Social History   Socioeconomic History  . Marital status: Married    Spouse name: Not on file  . Number of children: Not on file  . Years of education: Not on file  . Highest education level: Not on file  Occupational History  . Not on file  Social Needs  . Financial resource strain: Not on file  . Food insecurity:    Worry: Not on file    Inability: Not on file  . Transportation needs:    Medical: Not on file    Non-medical: Not on file  Tobacco Use  . Smoking status: Never Smoker  . Smokeless tobacco: Never Used  Substance and Sexual Activity  . Alcohol use: Yes    Comment: once every 2-3 months or less  . Drug use: No  .  Sexual activity: Not on file    Comment: married, works in emergency services  Lifestyle  . Physical activity:    Days per week: Not on file    Minutes per session: Not on file  . Stress: Not on file  Relationships  . Social connections:    Talks on phone: Not on file    Gets together: Not on file    Attends religious service: Not on file    Active member of club or organization: Not on file    Attends meetings of clubs or organizations: Not on file    Relationship status: Not on file  . Intimate partner violence:    Fear of current or ex partner: Not on file    Emotionally abused: Not on file    Physically abused: Not on file    Forced sexual activity: Not on file  Other  Topics Concern  . Not on file  Social History Narrative  . Not on file     Review of Systems  All other systems reviewed and are negative.      Objective:    Vitals:   02/26/18 1625  BP: 116/72  Pulse: 64  Resp: 15  Temp: 98.3 F (36.8 C)  TempSrc: Oral  SpO2: 98%  Weight: 208 lb 6 oz (94.5 kg)  Height: 5\' 10"  (1.778 m)      Physical Exam  Constitutional: He appears well-developed.  HENT:  Head: Normocephalic and atraumatic.  Right Ear: External ear normal.  Left Ear: External ear normal.  Nose: Nose normal.  Mouth/Throat: Oropharynx is clear and moist.  Large brown dry impacted cerumen obstructing left TM, no noted edema or erythema of visible portions of external auditory canal, no tragus or pinna tenderness, redness or swelling, no mastoid tenderness, decreased hearing grossly in the left  Right TM, external auditory canal and external ear normal  Eyes: Pupils are equal, round, and reactive to light. Conjunctivae are normal. Right eye exhibits no discharge. Left eye exhibits no discharge. No scleral icterus.  Neck: No tracheal deviation present.  Cardiovascular: Normal rate and regular rhythm.  Pulmonary/Chest: Effort normal. No stridor. No respiratory distress.  Musculoskeletal: Normal range of motion.  Neurological: He is alert. He exhibits normal muscle tone. Coordination normal.  Skin: Skin is warm and dry. No rash noted.  Psychiatric: He has a normal mood and affect. His behavior is normal.  Nursing note and vitals reviewed.    Procedure: Cerumen Disimpaction  Gentle ear lavage with warm water and hydrogen peroxide performed on the left ear. There were no complications and following the disimpaction the tympanic membrane was visible on the left. Tympanic membranes are intact following the procedure.  Auditory canals are normal peeling without edema, erythema or exudate.  The patient reported relief of symptoms after removal of cerumen.       Assessment  & Plan:      ICD-10-CM   1. Impacted cerumen of left ear H61.22   2. Clogged ear, left H93.8X2     Impacted cerumen to left canal, successfully removed with irrigation and with instrumentation with curette with direct visualization, TM intact afterwards, hearing improved, no concerning redness or edema.  Patient encouraged to try Debrox drops with gentle warm water and hydrogen peroxide irrigation when he heals his ears are blocked, also encouraged to never use Q-tips in his ears.  Delsa Grana, PA-C 02/28/18 1:54 PM

## 2018-03-27 ENCOUNTER — Ambulatory Visit: Payer: 59 | Admitting: Podiatry

## 2018-04-01 ENCOUNTER — Encounter: Payer: Self-pay | Admitting: Podiatry

## 2018-04-01 ENCOUNTER — Encounter

## 2018-04-01 ENCOUNTER — Other Ambulatory Visit: Payer: Self-pay | Admitting: Podiatry

## 2018-04-01 ENCOUNTER — Ambulatory Visit: Payer: 59 | Admitting: Podiatry

## 2018-04-01 ENCOUNTER — Ambulatory Visit (INDEPENDENT_AMBULATORY_CARE_PROVIDER_SITE_OTHER): Payer: 59

## 2018-04-01 DIAGNOSIS — M722 Plantar fascial fibromatosis: Secondary | ICD-10-CM

## 2018-04-01 DIAGNOSIS — M79673 Pain in unspecified foot: Principal | ICD-10-CM

## 2018-04-01 DIAGNOSIS — G8929 Other chronic pain: Secondary | ICD-10-CM

## 2018-04-01 MED ORDER — METHYLPREDNISOLONE 4 MG PO TBPK: ORAL_TABLET | ORAL | 0 refills | Status: DC

## 2018-04-01 MED ORDER — MELOXICAM 15 MG PO TABS: 15.0000 mg | ORAL_TABLET | Freq: Every day | ORAL | 1 refills | Status: AC

## 2018-04-01 NOTE — Patient Instructions (Signed)

## 2018-04-07 NOTE — Progress Notes (Signed)
   Subjective: 52 year old male presenting today as a new patient with a chief complaint of left heel pain that began several months ago. Walking and bearing weight for prolonged periods of time increases the pain. He has not done anything to treat the symptoms. Patient is here for further evaluation and treatment.   Past Medical History:  Diagnosis Date  . Bone lesion 05/05/2015  . Cancer [facility])    hodgkin lymphoma  . GERD (gastroesophageal reflux disease)   . Hernia   . Hodgkin lymphoma ([place]) 09/11/2014  . Hypercholesteremia   . Hyperlipidemia   . Lymphoma ([place])   . Pars defect of lumbar spine    bilateral 2013 (Dr. Sherwood Gambler)  . Wears contact lenses      Objective: Physical Exam General: The patient is alert and oriented x3 in no acute distress.  Dermatology: Skin is warm, dry and supple bilateral lower extremities. Negative for open lesions or macerations bilateral.   Vascular: Dorsalis Pedis and Posterior Tibial pulses palpable bilateral.  Capillary fill time is immediate to all digits.  Neurological: Epicritic and protective threshold intact bilateral.   Musculoskeletal: Tenderness to palpation to the plantar aspect of the left heel along the plantar fascia. All other joints range of motion within normal limits bilateral. Strength 5/5 in all groups bilateral.   Radiographic exam:   Normal osseous mineralization. Joint spaces preserved. No fracture/dislocation/boney destruction. No other soft tissue abnormalities or radiopaque foreign bodies.   Assessment: 1. Plantar fasciitis left foot  Plan of Care:  1. Patient evaluated. Xrays reviewed.   2. Injection of 0.5cc Celestone soluspan injected into the left plantar fascia.  3. Rx for Medrol Dose Pak placed 4. Rx for Meloxicam ordered for patient. 5. Plantar fascial band(s) dispensed  6. Instructed patient regarding therapies and modalities at home to alleviate symptoms.  7. Recommended good shoe gear.  8. Return to  clinic in 4 weeks.     Edrick Kins, DPM [facility]  Dr. Edrick Kins, DPM    [address], [place] [address]                Office [phone]  Fax [phone]

## 2018-05-07 ENCOUNTER — Other Ambulatory Visit: Payer: 59

## 2018-05-07 DIAGNOSIS — Z125 Encounter for screening for malignant neoplasm of prostate: Secondary | ICD-10-CM

## 2018-05-07 DIAGNOSIS — Z0000 Encounter for general adult medical examination without abnormal findings: Secondary | ICD-10-CM

## 2018-05-07 DIAGNOSIS — E7800 Pure hypercholesterolemia, unspecified: Secondary | ICD-10-CM

## 2018-05-07 LAB — COMPREHENSIVE METABOLIC PANEL
AG RATIO: 1.7 (calc) (ref 1.0–2.5)
ALBUMIN MSPROF: 4.3 g/dL (ref 3.6–5.1)
ALT: 22 U/L (ref 9–46)
AST: 24 U/L (ref 10–35)
Alkaline phosphatase (APISO): 59 U/L (ref 40–115)
BUN: 17 mg/dL (ref 7–25)
CHLORIDE: 104 mmol/L (ref 98–110)
CO2: 27 mmol/L (ref 20–32)
Calcium: 9.6 mg/dL (ref 8.6–10.3)
Creat: 1.22 mg/dL (ref 0.70–1.33)
GLUCOSE: 99 mg/dL (ref 65–99)
Globulin: 2.5 g/dL (calc) (ref 1.9–3.7)
POTASSIUM: 4.5 mmol/L (ref 3.5–5.3)
Sodium: 139 mmol/L (ref 135–146)
TOTAL PROTEIN: 6.8 g/dL (ref 6.1–8.1)
Total Bilirubin: 1.5 mg/dL — ABNORMAL HIGH (ref 0.2–1.2)

## 2018-05-07 LAB — CBC WITH DIFFERENTIAL/PLATELET
Basophils Absolute: 68 cells/uL (ref 0–200)
Basophils Relative: 0.9 %
EOS PCT: 2.5 %
Eosinophils Absolute: 188 cells/uL (ref 15–500)
HCT: 46.7 % (ref 38.5–50.0)
HEMOGLOBIN: 16 g/dL (ref 13.2–17.1)
Lymphs Abs: 2385 cells/uL (ref 850–3900)
MCH: 30.7 pg (ref 27.0–33.0)
MCHC: 34.3 g/dL (ref 32.0–36.0)
MCV: 89.5 fL (ref 80.0–100.0)
MONOS PCT: 7.3 %
MPV: 11.5 fL (ref 7.5–12.5)
NEUTROS ABS: 4313 {cells}/uL (ref 1500–7800)
Neutrophils Relative %: 57.5 %
Platelets: 172 10*3/uL (ref 140–400)
RBC: 5.22 10*6/uL (ref 4.20–5.80)
RDW: 12.2 % (ref 11.0–15.0)
Total Lymphocyte: 31.8 %
WBC mixed population: 548 cells/uL (ref 200–950)
WBC: 7.5 10*3/uL (ref 3.8–10.8)

## 2018-05-07 LAB — PSA: PSA: 0.2 ng/mL (ref <=4.0)

## 2018-05-07 LAB — LIPID PANEL
Cholesterol: 200 mg/dL — ABNORMAL HIGH (ref <200)
HDL: 50 mg/dL (ref >40)
LDL Cholesterol (Calc): 131 mg/dL (calc) — ABNORMAL HIGH
NON-HDL CHOLESTEROL (CALC): 150 mg/dL — AB (ref <130)
TRIGLYCERIDES: 91 mg/dL (ref <150)
Total CHOL/HDL Ratio: 4 (calc) (ref <5.0)

## 2018-05-09 ENCOUNTER — Encounter: Payer: Self-pay | Admitting: Family Medicine

## 2018-05-09 ENCOUNTER — Ambulatory Visit (INDEPENDENT_AMBULATORY_CARE_PROVIDER_SITE_OTHER): Payer: 59 | Admitting: Family Medicine

## 2018-05-09 VITALS — BP 120/72 | HR 74 | Temp 97.7°F | Resp 14 | Ht 70.0 in | Wt 210.0 lb

## 2018-05-09 DIAGNOSIS — Z23 Encounter for immunization: Secondary | ICD-10-CM

## 2018-05-09 DIAGNOSIS — I2510 Atherosclerotic heart disease of native coronary artery without angina pectoris: Secondary | ICD-10-CM | POA: Diagnosis not present

## 2018-05-09 DIAGNOSIS — Z0000 Encounter for general adult medical examination without abnormal findings: Secondary | ICD-10-CM | POA: Diagnosis not present

## 2018-05-09 DIAGNOSIS — Z86718 Personal history of other venous thrombosis and embolism: Secondary | ICD-10-CM | POA: Diagnosis not present

## 2018-05-09 DIAGNOSIS — E7800 Pure hypercholesterolemia, unspecified: Secondary | ICD-10-CM

## 2018-05-09 DIAGNOSIS — Z8571 Personal history of Hodgkin lymphoma: Secondary | ICD-10-CM

## 2018-05-09 NOTE — Addendum Note (Signed)
Addended by: Shary Decamp B on: 05/09/2018 11:30 AM   Modules accepted: Orders

## 2018-05-09 NOTE — Progress Notes (Signed)
Subjective:    Patient ID: Keith Forbes, male    DOB: 04/20/1966, 52 y.o.   MRN: [id number]  HPI  Patient is a 52 year old Caucasian male here today for complete physical exam.  Past medical history is significant for history of Hodgkin's disease in 2015.  He completed 6 months of ABVD therapy and his Hodgkin's disease remains in remission. He is following up regularly with his oncologist, Dr. Alvy Bimler.  Treatment was complicated by a DVT in his neck and upper left arm near his Port-A-Cath site. This was treated with 3 months of xarelto and immediate removal of the Port-A-Cath by surgery.  Patient has a history of mild atherosclerotic calcifications seen on a screening CT of the chest in 2018.  However he denies any chest pain shortness of breath dyspnea on exertion.  His Asian records and lab work are listed below: Immunization History  Administered Date(s) Administered  . Influenza Split 07/02/2013  . Influenza-Unspecified 07/02/2014, 07/03/2015, 06/29/2016  . Pneumococcal Polysaccharide-23 08/25/2015  . Tdap 08/03/2008   Lab on 05/07/2018  Component Date Value Ref Range Status  . WBC 05/07/2018 7.5  3.8 - 10.8 Thousand/uL Final  . RBC 05/07/2018 5.22  4.20 - 5.80 Million/uL Final  . Hemoglobin 05/07/2018 16.0  13.2 - 17.1 g/dL Final  . HCT 05/07/2018 46.7  38.5 - 50.0 % Final  . MCV 05/07/2018 89.5  80.0 - 100.0 fL Final  . MCH 05/07/2018 30.7  27.0 - 33.0 pg Final  . MCHC 05/07/2018 34.3  32.0 - 36.0 g/dL Final  . RDW 05/07/2018 12.2  11.0 - 15.0 % Final  . Platelets 05/07/2018 172  140 - 400 Thousand/uL Final  . MPV 05/07/2018 11.5  7.5 - 12.5 fL Final  . Neutro Abs 05/07/2018 4,313  1,500 - 7,800 cells/uL Final  . Lymphs Abs 05/07/2018 2,385  850 - 3,900 cells/uL Final  . WBC mixed population 05/07/2018 548  200 - 950 cells/uL Final  . Eosinophils Absolute 05/07/2018 188  15 - 500 cells/uL Final  . Basophils Absolute 05/07/2018 68  0 - 200 cells/uL Final  . Neutrophils  Relative % 05/07/2018 57.5  % Final  . Total Lymphocyte 05/07/2018 31.8  % Final  . Monocytes Relative 05/07/2018 7.3  % Final  . Eosinophils Relative 05/07/2018 2.5  % Final  . Basophils Relative 05/07/2018 0.9  % Final  . Glucose, Bld 05/07/2018 99  65 - 99 mg/dL Final   Comment: .            Fasting reference interval .   . BUN 05/07/2018 17  7 - 25 mg/dL Final  . Creat 05/07/2018 1.22  0.70 - 1.33 mg/dL Final   Comment: For patients >50 years of age, the reference limit for Creatinine is approximately 13% higher for people identified as African-American. .   Havery Moros Ratio 08/65/7846 NOT APPLICABLE  6 - 22 (calc) Final  . Sodium 05/07/2018 139  135 - 146 mmol/L Final  . Potassium 05/07/2018 4.5  3.5 - 5.3 mmol/L Final  . Chloride 05/07/2018 104  98 - 110 mmol/L Final  . CO2 05/07/2018 27  20 - 32 mmol/L Final  . Calcium 05/07/2018 9.6  8.6 - 10.3 mg/dL Final  . Total Protein 05/07/2018 6.8  6.1 - 8.1 g/dL Final  . Albumin 05/07/2018 4.3  3.6 - 5.1 g/dL Final  . Globulin 05/07/2018 2.5  1.9 - 3.7 g/dL (calc) Final  . AG Ratio 05/07/2018 1.7  1.0 - 2.5 (calc)  Final  . Total Bilirubin 05/07/2018 1.5* 0.2 - 1.2 mg/dL Final  . Alkaline phosphatase (APISO) 05/07/2018 59  40 - 115 U/L Final  . AST 05/07/2018 24  10 - 35 U/L Final  . ALT 05/07/2018 22  9 - 46 U/L Final  . Cholesterol 05/07/2018 200* <200 mg/dL Final  . HDL 05/07/2018 50  >40 mg/dL Final  . Triglycerides 05/07/2018 91  <150 mg/dL Final  . LDL Cholesterol (Calc) 05/07/2018 131* mg/dL (calc) Final   Comment: Reference range: <100 . Desirable range <100 mg/dL for primary prevention;   <70 mg/dL for patients with CHD or diabetic patients  with > or = 2 CHD risk factors. Marland Kitchen LDL-C is now calculated using the Martin-Hopkins  calculation, which is a validated novel method providing  better accuracy than the Friedewald equation in the  estimation of LDL-C.  Cresenciano Genre et al. Annamaria Helling. 6270;350(09): 2061-2068    (http://education.[employer].com/faq/FAQ164)   . Total CHOL/HDL Ratio 05/07/2018 4.0  <5.0 (calc) Final  . Non-HDL Cholesterol (Calc) 05/07/2018 150* <130 mg/dL (calc) Final   Comment: For patients with diabetes plus 1 major ASCVD risk  factor, treating to a non-HDL-C goal of <100 mg/dL  (LDL-C of <70 mg/dL) is considered a therapeutic  option.   Marland Kitchen PSA 05/07/2018 0.2  < OR = 4.0 ng/mL Final   Comment: The total PSA value from this assay system is  standardized against the WHO standard. The test  result will be approximately 20% lower when compared  to the equimolar-standardized total PSA (Beckman  Coulter). Comparison of serial PSA results should be  interpreted with this fact in mind. . This test was performed using the Siemens  chemiluminescent method. Values obtained from  different assay methods cannot be used interchangeably. PSA levels, regardless of value, should not be interpreted as absolute evidence of the presence or absence of disease.    Past Medical History:  Diagnosis Date  . Bone lesion 05/05/2015  . Cancer [facility])    hodgkin lymphoma  . GERD (gastroesophageal reflux disease)   . Hernia   . Hodgkin lymphoma ([place]) 09/11/2014  . Hypercholesteremia   . Hyperlipidemia   . Lymphoma ([place])   . Pars defect of lumbar spine    bilateral 2013 (Dr. Sherwood Gambler)  . Wears contact lenses    Past Surgical History:  Procedure Laterality Date  . CLOSED REDUCTION HAND FRACTURE    . FINGER FRACTURE SURGERY    . LYMPH NODE BIOPSY  12/15  . PORT-A-CATH REMOVAL N/A 04/27/2015   Procedure: REMOVAL PORT-A-CATH;  Surgeon: Stark Klein, MD;  Location: [facility];  Service: General;  Laterality: N/A;  . PORTACATH PLACEMENT Left 09/15/2014   Procedure: INSERTION PORT-A-CATH WITH ULTRA SOUND;  Surgeon: Fanny Skates, MD;  Location: [facility];  Service: General;  Laterality: Left;   Current Outpatient Medications on File Prior to Visit  Medication Sig Dispense Refill   . aspirin EC 81 MG tablet Take 81 mg by mouth daily.    Marland Kitchen atorvastatin (LIPITOR) 40 MG tablet TAKE 1 TABLET BY MOUTH EVERY DAY 90 tablet 2  . cholecalciferol (VITAMIN D) 1000 units tablet Take 2,000 Units by mouth daily.     . fluticasone (FLONASE) 50 MCG/ACT nasal spray Place 2 sprays into both nostrils daily. (Patient not taking: Reported on 02/26/2018) 16 g 6  . methylPREDNISolone (MEDROL DOSEPAK) 4 MG TBPK tablet 6 day dose pack - take as directed 21 tablet 0  . Multiple Vitamin (MULTIVITAMIN) tablet Take 1 tablet  by mouth daily. occasional     No current facility-administered medications on file prior to visit.    No Known Allergies Social History   Socioeconomic History  . Marital status: Married    Spouse name: Not on file  . Number of children: Not on file  . Years of education: Not on file  . Highest education level: Not on file  Occupational History  . Not on file  Social Needs  . Financial resource strain: Not on file  . Food insecurity:    Worry: Not on file    Inability: Not on file  . Transportation needs:    Medical: Not on file    Non-medical: Not on file  Tobacco Use  . Smoking status: Never Smoker  . Smokeless tobacco: Never Used  Substance and Sexual Activity  . Alcohol use: Yes    Comment: once every 2-3 months or less  . Drug use: No  . Sexual activity: Not on file    Comment: married, works in emergency services  Lifestyle  . Physical activity:    Days per week: Not on file    Minutes per session: Not on file  . Stress: Not on file  Relationships  . Social connections:    Talks on phone: Not on file    Gets together: Not on file    Attends religious service: Not on file    Active member of club or organization: Not on file    Attends meetings of clubs or organizations: Not on file    Relationship status: Not on file  . Intimate partner violence:    Fear of current or ex partner: Not on file    Emotionally abused: Not on file    Physically  abused: Not on file    Forced sexual activity: Not on file  Other Topics Concern  . Not on file  Social History Narrative  . Not on file   Family History  Problem Relation Age of Onset  . Hyperlipidemia Father   . Heart disease Father   . Cancer Paternal Aunt        breast ca  . Colon cancer Neg Hx   Father passed away from pulmonary fibrosis  Review of Systems  All other systems reviewed and are negative.      Objective:   Physical Exam  Constitutional: He is oriented to person, place, and time. He appears well-developed and well-nourished. No distress.  HENT:  Head: Normocephalic and atraumatic.  Right Ear: External ear normal.  Left Ear: External ear normal.  Nose: Nose normal.  Mouth/Throat: Oropharynx is clear and moist. No oropharyngeal exudate.  Eyes: Pupils are equal, round, and reactive to light. Conjunctivae and EOM are normal. Right eye exhibits no discharge. Left eye exhibits no discharge. No scleral icterus.  Neck: Normal range of motion. Neck supple. No JVD present. No tracheal deviation present. No thyromegaly present.  Cardiovascular: Normal rate, regular rhythm, normal heart sounds and intact distal pulses. Exam reveals no gallop and no friction rub.  No murmur heard. Pulmonary/Chest: Effort normal and breath sounds normal. No stridor. No respiratory distress. He has no wheezes. He has no rales. He exhibits no tenderness.  Abdominal: Soft. Bowel sounds are normal. He exhibits no distension and no mass. There is no tenderness. There is no rebound and no guarding. Hernia confirmed negative in the right inguinal area and confirmed negative in the left inguinal area.  Musculoskeletal: Normal range of motion. He exhibits no edema or  tenderness.  Lymphadenopathy:    He has no cervical adenopathy.       Right: No inguinal adenopathy present.       Left: No inguinal adenopathy present.  Neurological: He is alert and oriented to person, place, and time. He has normal  reflexes. No cranial nerve deficit. He exhibits normal muscle tone. Coordination normal.  Skin: Skin is warm. No rash noted. He is not diaphoretic. No erythema. No pallor.  Psychiatric: He has a normal mood and affect. His behavior is normal. Judgment and thought content normal.  Vitals reviewed.         Assessment & Plan:  General medical exam  Pure hypercholesterolemia  Coronary artery calcification seen on CAT scan  Personal history of DVT (deep vein thrombosis)  History of Hodgkin's lymphoma   Overall patient is doing well with no concerns.  He denies any fevers chills or night sweats.  There is no lymphadenopathy on exam.  He will follow-up in 6 months with his oncologist and then see me again next year or as needed.  I recommended a flu shot this fall.  He received a tetanus booster.  We discussed the coronary artery calcification seen on CT scan.  I calculated his 10-year risk of cardiovascular disease to be 3.4% at his current lab values.  He will continue Lipitor 40 mg a day but he will try to reduce the saturated fat, red meat, fried foods, and pork in his diet and eat more fruits and vegetables.  Colonoscopy is up-to-date.  Prostate cancer screening test last PSA is virtually undetectable.  The remainder of his lab work is excellent.

## 2018-05-27 ENCOUNTER — Encounter: Payer: 59 | Admitting: Podiatry

## 2018-05-31 NOTE — Progress Notes (Signed)
This encounter was created in error - please disregard.

## 2018-08-22 ENCOUNTER — Other Ambulatory Visit: Payer: Self-pay | Admitting: Family Medicine

## 2018-10-23 ENCOUNTER — Telehealth: Payer: Self-pay | Admitting: Hematology and Oncology

## 2018-10-23 NOTE — Telephone Encounter (Signed)
Tried to call regarding 2/10 but no voicemail set up

## 2018-11-11 ENCOUNTER — Telehealth: Payer: Self-pay | Admitting: Hematology and Oncology

## 2018-11-11 ENCOUNTER — Inpatient Hospital Stay: Payer: 59 | Attending: Hematology and Oncology

## 2018-11-11 ENCOUNTER — Other Ambulatory Visit: Payer: Self-pay | Admitting: Hematology and Oncology

## 2018-11-11 ENCOUNTER — Encounter: Payer: Self-pay | Admitting: Hematology and Oncology

## 2018-11-11 ENCOUNTER — Inpatient Hospital Stay (HOSPITAL_BASED_OUTPATIENT_CLINIC_OR_DEPARTMENT_OTHER): Payer: 59 | Admitting: Hematology and Oncology

## 2018-11-11 DIAGNOSIS — Z8571 Personal history of Hodgkin lymphoma: Secondary | ICD-10-CM

## 2018-11-11 DIAGNOSIS — E7800 Pure hypercholesterolemia, unspecified: Secondary | ICD-10-CM | POA: Insufficient documentation

## 2018-11-11 DIAGNOSIS — R748 Abnormal levels of other serum enzymes: Secondary | ICD-10-CM

## 2018-11-11 DIAGNOSIS — Z7982 Long term (current) use of aspirin: Secondary | ICD-10-CM

## 2018-11-11 DIAGNOSIS — Z79899 Other long term (current) drug therapy: Secondary | ICD-10-CM | POA: Diagnosis not present

## 2018-11-11 LAB — COMPREHENSIVE METABOLIC PANEL
ALT: 77 U/L — AB (ref 0–44)
AST: 54 U/L — ABNORMAL HIGH (ref 15–41)
Albumin: 4.3 g/dL (ref 3.5–5.0)
Alkaline Phosphatase: 58 U/L (ref 38–126)
Anion gap: 9 (ref 5–15)
BUN: 16 mg/dL (ref 6–20)
CHLORIDE: 105 mmol/L (ref 98–111)
CO2: 28 mmol/L (ref 22–32)
Calcium: 9.5 mg/dL (ref 8.9–10.3)
Creatinine, Ser: 1.26 mg/dL — ABNORMAL HIGH (ref 0.61–1.24)
Glucose, Bld: 94 mg/dL (ref 70–99)
Potassium: 4.6 mmol/L (ref 3.5–5.1)
Sodium: 142 mmol/L (ref 135–145)
Total Bilirubin: 1.1 mg/dL (ref 0.3–1.2)
Total Protein: 7.1 g/dL (ref 6.5–8.1)

## 2018-11-11 LAB — CBC WITH DIFFERENTIAL/PLATELET
Abs Immature Granulocytes: 0.04 10*3/uL (ref 0.00–0.07)
BASOS ABS: 0.1 10*3/uL (ref 0.0–0.1)
Basophils Relative: 1 %
EOS ABS: 0.2 10*3/uL (ref 0.0–0.5)
Eosinophils Relative: 2 %
HCT: 44.7 % (ref 39.0–52.0)
HEMOGLOBIN: 14.9 g/dL (ref 13.0–17.0)
Immature Granulocytes: 1 %
LYMPHS PCT: 29 %
Lymphs Abs: 2.4 10*3/uL (ref 0.7–4.0)
MCH: 30.5 pg (ref 26.0–34.0)
MCHC: 33.3 g/dL (ref 30.0–36.0)
MCV: 91.6 fL (ref 80.0–100.0)
Monocytes Absolute: 0.5 10*3/uL (ref 0.1–1.0)
Monocytes Relative: 7 %
NRBC: 0 % (ref 0.0–0.2)
Neutro Abs: 5 10*3/uL (ref 1.7–7.7)
Neutrophils Relative %: 60 %
Platelets: 169 10*3/uL (ref 150–400)
RBC: 4.88 MIL/uL (ref 4.22–5.81)
RDW: 12.2 % (ref 11.5–15.5)
WBC: 8.2 10*3/uL (ref 4.0–10.5)

## 2018-11-11 LAB — LACTATE DEHYDROGENASE: LDH: 196 U/L — AB (ref 98–192)

## 2018-11-11 NOTE — Assessment & Plan Note (Signed)
He has mild intermittent elevated liver enzymes, likely triggered by recent gastroenteritis The patient is also noted to have hypercholesterolemia I recommend observation only

## 2018-11-11 NOTE — Telephone Encounter (Signed)
Gave avs and calendar ° °

## 2018-11-11 NOTE — Progress Notes (Signed)
[facility] OFFICE PROGRESS NOTE  Patient Care Team: [name], MD as PCP - General (Family Medicine)  ASSESSMENT & PLAN:  History of hodgkin's lymphoma His last CT scan on November 22, 2016 showed no evidence of active disease. I recommend repeat blood work history and examination in 12 months. I would discontinue surveillance imaging We discussed cancer survivorship issues   Elevated liver enzymes He has mild intermittent elevated liver enzymes, likely triggered by recent gastroenteritis The patient is also noted to have hypercholesterolemia I recommend observation only   No orders of the defined types were placed in this encounter.   INTERVAL HISTORY: Please see below for problem oriented charting. He returns with his wife for further follow-up He had recent viral illness causing GI upset He is fully recovered No recent fever or chills Appetite is stable No new lymphadenopathy or night sweats  SUMMARY OF ONCOLOGIC HISTORY: Oncology History   Hodgkin lymphoma   Staging form: Lymphoid Neoplasms, AJCC 6th Edition     Clinical stage from 09/13/2014: Stage III - Signed by [name], MD on 09/13/2014       History of Hodgkin's lymphoma   09/04/2014 Pathology Results    Accession: [id number] inguinal lymph node biopsy confirmed Hodgkin lymphoma    09/04/2014 Procedure    Dr. Dalbert Batman performed excision of left inguinal lymph node biopsy    09/11/2014 Imaging    CT scan of the chest, abdomen and pelvis show disease both above and below the diaphragm    09/16/2014 Imaging    Echocardiogram is negative    09/18/2014 Bone Marrow Biopsy    Bone marrow aspirate and biopsy is negative    09/22/2014 - 03/03/2015 Chemotherapy    Patient completed 6 cycles of ABVD. Bleomycin was discontinued in March 2016    10/06/2014 Adverse Reaction     treatment was delayed due to severe leukopenia. Future dose modification was made for abnormal liver function tests,  leukopenia and neuropathy    11/23/2014 Imaging    PET CT scan showed near complete response to Rx    12/23/2014 Adverse Reaction    Bleomycin was discontinued due to suspected bleomycin toxicity    04/25/2015 Imaging    US venous Doppller left upper extremity showed acute DVT noted in left internal jugular vein. All other veins appear thrombus free    04/27/2015 Surgery    He had port removal.    05/04/2015 Imaging    PET CT scan showed no evidence of disease.    11/15/2015 Imaging    Stable mild right internal mammary lymphadenopathy with mild hypermetabolic activity, Deauville score 2.    05/15/2016 Imaging    Ct scan showed no new or progressive lymphoma in the chest, abdomen or pelvis. Stable mildly enlarged right internal mammary lymph node, suggesting treated lymphoma    11/20/2016 Imaging    Stable 10 mm right internal mammary node, unchanged, likely related to treated lymphoma. No findings suggestive of active lymphoma in the chest, abdomen, or pelvis.     REVIEW OF SYSTEMS:   Constitutional: Denies fevers, chills or abnormal weight loss Eyes: Denies blurriness of vision Ears, nose, mouth, throat, and face: Denies mucositis or sore throat Respiratory: Denies cough, dyspnea or wheezes Cardiovascular: Denies palpitation, chest discomfort or lower extremity swelling Skin: Denies abnormal skin rashes Lymphatics: Denies new lymphadenopathy or easy bruising Neurological:Denies numbness, tingling or new weaknesses Behavioral/Psych: Mood is stable, no new changes  All other systems were reviewed with the patient  and are negative.  I have reviewed the past medical history, past surgical history, social history and family history with the patient and they are unchanged from previous note.  ALLERGIES:  has No Known Allergies.  MEDICATIONS:  Current Outpatient Medications  Medication Sig Dispense Refill  . aspirin EC 81 MG tablet Take 81 mg by mouth every other day.    Marland Kitchen  atorvastatin (LIPITOR) 40 MG tablet TAKE 1 TABLET BY MOUTH EVERY DAY 90 tablet 1  . cholecalciferol (VITAMIN D) 1000 units tablet Take 2,000 Units by mouth daily.     . Multiple Vitamin (MULTIVITAMIN) tablet Take 1 tablet by mouth daily. occasional     No current facility-administered medications for this visit.     PHYSICAL EXAMINATION: ECOG PERFORMANCE STATUS: 0 - Asymptomatic  Vitals:   11/11/18 1215  BP: 115/68  Pulse: (!) 57  Resp: 18  Temp: 97.8 F (36.6 C)  SpO2: 99%   Filed Weights   11/11/18 1215  Weight: 209 lb 3.2 oz (94.9 kg)    GENERAL:alert, no distress and comfortable SKIN: skin color, texture, turgor are normal, no rashes or significant lesions EYES: normal, Conjunctiva are pink and non-injected, sclera clear OROPHARYNX:no exudate, no erythema and lips, buccal mucosa, and tongue normal  NECK: supple, thyroid normal size, non-tender, without nodularity LYMPH:  no palpable lymphadenopathy in the cervical, axillary or inguinal LUNGS: clear to auscultation and percussion with normal breathing effort HEART: regular rate & rhythm and no murmurs and no lower extremity edema ABDOMEN:abdomen soft, non-tender and normal bowel sounds Musculoskeletal:no cyanosis of digits and no clubbing  NEURO: alert & oriented x 3 with fluent speech, no focal motor/sensory deficits  LABORATORY DATA:  I have reviewed the data as listed    Component Value Date/Time   NA 142 11/11/2018 1154   NA 141 05/10/2017 0822   K 4.6 11/11/2018 1154   K 4.3 05/10/2017 0822   CL 105 11/11/2018 1154   CO2 28 11/11/2018 1154   CO2 27 05/10/2017 0822   GLUCOSE 94 11/11/2018 1154   GLUCOSE 80 05/10/2017 0822   BUN 16 11/11/2018 1154   BUN 17.8 05/10/2017 0822   CREATININE 1.26 (H) 11/11/2018 1154   CREATININE 1.22 05/07/2018 0825   CREATININE 1.2 05/10/2017 0822   CALCIUM 9.5 11/11/2018 1154   CALCIUM 9.8 05/10/2017 0822   PROT 7.1 11/11/2018 1154   PROT 6.8 05/10/2017 0822   ALBUMIN 4.3  11/11/2018 1154   ALBUMIN 3.9 05/10/2017 0822   AST 54 (H) 11/11/2018 1154   AST 25 05/10/2017 0822   ALT 77 (H) 11/11/2018 1154   ALT 24 05/10/2017 0822   ALKPHOS 58 11/11/2018 1154   ALKPHOS 65 05/10/2017 0822   BILITOT 1.1 11/11/2018 1154   BILITOT 1.71 (H) 05/10/2017 0822   GFRNONAA >60 11/11/2018 1154   GFRNONAA 63 07/06/2016 1641   GFRAA >60 11/11/2018 1154   GFRAA 73 07/06/2016 1641    No results found for: SPEP, UPEP  Lab Results  Component Value Date   WBC 8.2 11/11/2018   NEUTROABS 5.0 11/11/2018   HGB 14.9 11/11/2018   HCT 44.7 11/11/2018   MCV 91.6 11/11/2018   PLT 169 11/11/2018      Chemistry      Component Value Date/Time   NA 142 11/11/2018 1154   NA 141 05/10/2017 0822   K 4.6 11/11/2018 1154   K 4.3 05/10/2017 0822   CL 105 11/11/2018 1154   CO2 28 11/11/2018 1154   CO2  27 05/10/2017 0822   BUN 16 11/11/2018 1154   BUN 17.8 05/10/2017 0822   CREATININE 1.26 (H) 11/11/2018 1154   CREATININE 1.22 05/07/2018 0825   CREATININE 1.2 05/10/2017 0822      Component Value Date/Time   CALCIUM 9.5 11/11/2018 1154   CALCIUM 9.8 05/10/2017 0822   ALKPHOS 58 11/11/2018 1154   ALKPHOS 65 05/10/2017 0822   AST 54 (H) 11/11/2018 1154   AST 25 05/10/2017 0822   ALT 77 (H) 11/11/2018 1154   ALT 24 05/10/2017 0822   BILITOT 1.1 11/11/2018 1154   BILITOT 1.71 (H) 05/10/2017 4982       All questions were answered. The patient knows to call the clinic with any problems, questions or concerns. No barriers to learning was detected.  I spent 10 minutes counseling the patient face to face. The total time spent in the appointment was 15 minutes and more than 50% was on counseling and review of test results  [name], MD 11/11/2018 1:43 PM

## 2018-11-11 NOTE — Assessment & Plan Note (Signed)
His last CT scan on November 22, 2016 showed no evidence of active disease. I recommend repeat blood work history and examination in 12 months. I would discontinue surveillance imaging We discussed cancer survivorship issues

## 2019-03-06 ENCOUNTER — Other Ambulatory Visit: Payer: Self-pay | Admitting: Family Medicine

## 2019-05-08 ENCOUNTER — Other Ambulatory Visit: Payer: 59

## 2019-05-08 ENCOUNTER — Other Ambulatory Visit: Payer: Self-pay

## 2019-05-08 DIAGNOSIS — E7800 Pure hypercholesterolemia, unspecified: Secondary | ICD-10-CM

## 2019-05-08 DIAGNOSIS — Z0000 Encounter for general adult medical examination without abnormal findings: Secondary | ICD-10-CM

## 2019-05-09 LAB — CBC WITH DIFFERENTIAL/PLATELET
Absolute Monocytes: 494 cells/uL (ref 200–950)
Basophils Absolute: 52 cells/uL (ref 0–200)
Basophils Relative: 0.8 %
Eosinophils Absolute: 228 cells/uL (ref 15–500)
Eosinophils Relative: 3.5 %
HCT: 46.3 % (ref 38.5–50.0)
Hemoglobin: 15.6 g/dL (ref 13.2–17.1)
Lymphs Abs: 1989 cells/uL (ref 850–3900)
MCH: 31.2 pg (ref 27.0–33.0)
MCHC: 33.7 g/dL (ref 32.0–36.0)
MCV: 92.6 fL (ref 80.0–100.0)
MPV: 11.9 fL (ref 7.5–12.5)
Monocytes Relative: 7.6 %
Neutro Abs: 3738 cells/uL (ref 1500–7800)
Neutrophils Relative %: 57.5 %
Platelets: 150 10*3/uL (ref 140–400)
RBC: 5 10*6/uL (ref 4.20–5.80)
RDW: 12 % (ref 11.0–15.0)
Total Lymphocyte: 30.6 %
WBC: 6.5 10*3/uL (ref 3.8–10.8)

## 2019-05-09 LAB — COMPLETE METABOLIC PANEL WITH GFR
AG Ratio: 1.9 (calc) (ref 1.0–2.5)
ALT: 27 U/L (ref 9–46)
AST: 27 U/L (ref 10–35)
Albumin: 4.4 g/dL (ref 3.6–5.1)
Alkaline phosphatase (APISO): 61 U/L (ref 35–144)
BUN: 18 mg/dL (ref 7–25)
CO2: 29 mmol/L (ref 20–32)
Calcium: 9.6 mg/dL (ref 8.6–10.3)
Chloride: 106 mmol/L (ref 98–110)
Creat: 1.19 mg/dL (ref 0.70–1.33)
GFR, Est African American: 81 mL/min/{1.73_m2} (ref >=60)
GFR, Est Non African American: 70 mL/min/{1.73_m2} (ref >=60)
Globulin: 2.3 g/dL (calc) (ref 1.9–3.7)
Glucose, Bld: 91 mg/dL (ref 65–99)
Potassium: 4.7 mmol/L (ref 3.5–5.3)
Sodium: 141 mmol/L (ref 135–146)
Total Bilirubin: 1.4 mg/dL — ABNORMAL HIGH (ref 0.2–1.2)
Total Protein: 6.7 g/dL (ref 6.1–8.1)

## 2019-05-09 LAB — LIPID PANEL
Cholesterol: 155 mg/dL (ref <200)
HDL: 43 mg/dL (ref >=40)
LDL Cholesterol (Calc): 95 mg/dL (calc)
Non-HDL Cholesterol (Calc): 112 mg/dL (calc) (ref <130)
Total CHOL/HDL Ratio: 3.6 (calc) (ref <5.0)
Triglycerides: 84 mg/dL (ref <150)

## 2019-05-09 LAB — PSA: PSA: 0.1 ng/mL (ref <=4.0)

## 2019-05-15 ENCOUNTER — Other Ambulatory Visit: Payer: Self-pay

## 2019-05-15 ENCOUNTER — Encounter: Payer: Self-pay | Admitting: Family Medicine

## 2019-05-15 ENCOUNTER — Ambulatory Visit (INDEPENDENT_AMBULATORY_CARE_PROVIDER_SITE_OTHER): Payer: 59 | Admitting: Family Medicine

## 2019-05-15 VITALS — BP 130/70 | HR 68 | Temp 97.8°F | Resp 16 | Ht 70.0 in | Wt 205.0 lb

## 2019-05-15 DIAGNOSIS — Z8571 Personal history of Hodgkin lymphoma: Secondary | ICD-10-CM

## 2019-05-15 DIAGNOSIS — Z0001 Encounter for general adult medical examination with abnormal findings: Secondary | ICD-10-CM | POA: Diagnosis not present

## 2019-05-15 DIAGNOSIS — Z0000 Encounter for general adult medical examination without abnormal findings: Secondary | ICD-10-CM

## 2019-05-15 DIAGNOSIS — L570 Actinic keratosis: Secondary | ICD-10-CM

## 2019-05-15 DIAGNOSIS — E7800 Pure hypercholesterolemia, unspecified: Secondary | ICD-10-CM

## 2019-05-15 NOTE — Progress Notes (Signed)
Subjective:    Patient ID: [facility], male    DOB: 03-Feb-1966, 53 y.o.   MRN: [id number]  HPI  Patient is a 53 year old Caucasian male here today for complete physical exam.  Past medical history is significant for history of Hodgkin's disease in 2015.  He completed 6 months of ABVD therapy and his Hodgkin's disease remains in remission. He is following up regularly with his oncologist, Dr. Alvy Bimler.  Treatment was complicated by a DVT in his neck and upper left arm near his Port-A-Cath site. This was treated with 3 months of xarelto and immediate removal of the Port-A-Cath by surgery.  Patient has a history of mild atherosclerotic calcifications seen on a screening CT of the chest in 2018.  However he denies any chest pain shortness of breath dyspnea on exertion.  Patient had a colonoscopy that was completely clear in 2018.  He denies any blood in his stool.  He does have a growth on the crown of his head.  It is roughly 11 mm in diameter.  Is elliptical in shape.  It is a pink patch of skin with a thick hard white scale.  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  Otherwise he is doing well with no concerns.  He does report some muscle aches and also some short-term memory problems that been chronic since chemotherapy Immunization History  Administered Date(s) Administered  . Influenza Split 07/02/2013  . Influenza-Unspecified 07/02/2014, 07/03/2015, 06/29/2016  . Pneumococcal Polysaccharide-23 08/25/2015  . Tdap 08/03/2008, 05/09/2018   Lab on 05/08/2019  Component Date Value Ref Range Status  . Cholesterol 05/08/2019 155  <200 mg/dL Final  . HDL 05/08/2019 43  > OR = 40 mg/dL Final  . Triglycerides 05/08/2019 84  <150 mg/dL Final  . LDL Cholesterol (Calc) 05/08/2019 95  mg/dL (calc) Final   Comment: Reference range: <100 . Desirable range <100 mg/dL for primary prevention;   <70 mg/dL for patients with CHD or diabetic patients  with > or = 2 CHD  risk factors. Marland Kitchen LDL-C is now calculated using the Martin-Hopkins  calculation, which is a validated novel method providing  better accuracy than the Friedewald equation in the  estimation of LDL-C.  Cresenciano Genre et al. Annamaria Helling. 7673;419(37): 2061-2068  (http://education.[facility].com/faq/FAQ164)   . Total CHOL/HDL Ratio 05/08/2019 3.6  <5.0 (calc) Final  . Non-HDL Cholesterol (Calc) 05/08/2019 112  <130 mg/dL (calc) Final   Comment: For patients with diabetes plus 1 major ASCVD risk  factor, treating to a non-HDL-C goal of <100 mg/dL  (LDL-C of <70 mg/dL) is considered a therapeutic  option.   . WBC 05/08/2019 6.5  3.8 - 10.8 Thousand/uL Final  . RBC 05/08/2019 5.00  4.20 - 5.80 Million/uL Final  . Hemoglobin 05/08/2019 15.6  13.2 - 17.1 g/dL Final  . HCT 05/08/2019 46.3  38.5 - 50.0 % Final  . MCV 05/08/2019 92.6  80.0 - 100.0 fL Final  . MCH 05/08/2019 31.2  27.0 - 33.0 pg Final  . MCHC 05/08/2019 33.7  32.0 - 36.0 g/dL Final  . RDW 05/08/2019 12.0  11.0 - 15.0 % Final  . Platelets 05/08/2019 150  140 - 400 Thousand/uL Final  . MPV 05/08/2019 11.9  7.5 - 12.5 fL Final  . Neutro Abs 05/08/2019 3,738  1,500 - 7,800 cells/uL Final  . Lymphs Abs 05/08/2019 1,989  850 - 3,900 cells/uL Final  . Absolute Monocytes 05/08/2019 494  200 - 950 cells/uL Final  .  Eosinophils Absolute 05/08/2019 228  15 - 500 cells/uL Final  . Basophils Absolute 05/08/2019 52  0 - 200 cells/uL Final  . Neutrophils Relative % 05/08/2019 57.5  % Final  . Total Lymphocyte 05/08/2019 30.6  % Final  . Monocytes Relative 05/08/2019 7.6  % Final  . Eosinophils Relative 05/08/2019 3.5  % Final  . Basophils Relative 05/08/2019 0.8  % Final  . Glucose, Bld 05/08/2019 91  65 - 99 mg/dL Final   Comment: .            Fasting reference interval .   . BUN 05/08/2019 18  7 - 25 mg/dL Final  . Creat 05/08/2019 1.19  0.70 - 1.33 mg/dL Final   Comment: For patients >58 years of age, the reference limit for Creatinine  is approximately 13% higher for people identified as African-American. .   . GFR, Est Non African American 05/08/2019 70  > OR = 60 mL/min/1.17m2 Final  . GFR, Est African American 05/08/2019 81  > OR = 60 mL/min/1.43m2 Final  . BUN/Creatinine Ratio 42/59/5638 NOT APPLICABLE  6 - 22 (calc) Final  . Sodium 05/08/2019 141  135 - 146 mmol/L Final  . Potassium 05/08/2019 4.7  3.5 - 5.3 mmol/L Final  . Chloride 05/08/2019 106  98 - 110 mmol/L Final  . CO2 05/08/2019 29  20 - 32 mmol/L Final  . Calcium 05/08/2019 9.6  8.6 - 10.3 mg/dL Final  . Total Protein 05/08/2019 6.7  6.1 - 8.1 g/dL Final  . Albumin 05/08/2019 4.4  3.6 - 5.1 g/dL Final  . Globulin 05/08/2019 2.3  1.9 - 3.7 g/dL (calc) Final  . AG Ratio 05/08/2019 1.9  1.0 - 2.5 (calc) Final  . Total Bilirubin 05/08/2019 1.4* 0.2 - 1.2 mg/dL Final  . Alkaline phosphatase (APISO) 05/08/2019 61  35 - 144 U/L Final  . AST 05/08/2019 27  10 - 35 U/L Final  . ALT 05/08/2019 27  9 - 46 U/L Final  . PSA 05/08/2019 0.1  < OR = 4.0 ng/mL Final   Comment: The total PSA value from this assay system is  standardized against the WHO standard. The test  result will be approximately 20% lower when compared  to the equimolar-standardized total PSA (Beckman  Coulter). Comparison of serial PSA results should be  interpreted with this fact in mind. . This test was performed using the Siemens  chemiluminescent method. Values obtained from  different assay methods cannot be used interchangeably. PSA levels, regardless of value, should not be interpreted as absolute evidence of the presence or absence of disease.    Past Medical History:  Diagnosis Date  . Bone lesion 05/05/2015  . Cancer [facility])    hodgkin lymphoma  . GERD (gastroesophageal reflux disease)   . Hernia   . Hodgkin lymphoma ([place]) 09/11/2014  . Hypercholesteremia   . Hyperlipidemia   . Lymphoma ([place])   . Pars defect of lumbar spine    bilateral 2013 (Dr. Sherwood Gambler)  . Wears contact  lenses    Past Surgical History:  Procedure Laterality Date  . CLOSED REDUCTION HAND FRACTURE    . FINGER FRACTURE SURGERY    . LYMPH NODE BIOPSY  12/15  . PORT-A-CATH REMOVAL N/A 04/27/2015   Procedure: REMOVAL PORT-A-CATH;  Surgeon: Stark Klein, MD;  Location: [facility];  Service: General;  Laterality: N/A;  . PORTACATH PLACEMENT Left 09/15/2014   Procedure: INSERTION PORT-A-CATH WITH ULTRA SOUND;  Surgeon: Fanny Skates, MD;  Location: [facility];  Service: General;  Laterality: Left;   Current Outpatient Medications on File Prior to Visit  Medication Sig Dispense Refill  . aspirin EC 81 MG tablet Take 81 mg by mouth every other day.    Marland Kitchen atorvastatin (LIPITOR) 40 MG tablet TAKE 1 TABLET BY MOUTH EVERY DAY 30 tablet 1  . cholecalciferol (VITAMIN D) 1000 units tablet Take 2,000 Units by mouth daily.     . Multiple Vitamin (MULTIVITAMIN) tablet Take 1 tablet by mouth daily. occasional     No current facility-administered medications on file prior to visit.    No Known Allergies Social History   Socioeconomic History  . Marital status: Married    Spouse name: Not on file  . Number of children: Not on file  . Years of education: Not on file  . Highest education level: Not on file  Occupational History  . Not on file  Social Needs  . Financial resource strain: Not on file  . Food insecurity    Worry: Not on file    Inability: Not on file  . Transportation needs    Medical: Not on file    Non-medical: Not on file  Tobacco Use  . Smoking status: Never Smoker  . Smokeless tobacco: Never Used  Substance and Sexual Activity  . Alcohol use: Yes    Comment: once every 2-3 months or less  . Drug use: No  . Sexual activity: Not on file    Comment: married, works in emergency services  Lifestyle  . Physical activity    Days per week: Not on file    Minutes per session: Not on file  . Stress: Not on file  Relationships  . Social Herbalist on phone:  Not on file    Gets together: Not on file    Attends religious service: Not on file    Active member of club or organization: Not on file    Attends meetings of clubs or organizations: Not on file    Relationship status: Not on file  . Intimate partner violence    Fear of current or ex partner: Not on file    Emotionally abused: Not on file    Physically abused: Not on file    Forced sexual activity: Not on file  Other Topics Concern  . Not on file  Social History Narrative  . Not on file   Family History  Problem Relation Age of Onset  . Hyperlipidemia Father   . Heart disease Father   . Cancer Paternal Aunt        breast ca  . Colon cancer Neg Hx   Father passed away from pulmonary fibrosis  Review of Systems  All other systems reviewed and are negative.      Objective:   Physical Exam  Constitutional: He is oriented to person, place, and time. He appears well-developed and well-nourished. No distress.  HENT:  Head: Normocephalic and atraumatic.  Right Ear: External ear normal.  Left Ear: External ear normal.  Nose: Nose normal.  Mouth/Throat: Oropharynx is clear and moist. No oropharyngeal exudate.  Eyes: Pupils are equal, round, and reactive to light. Conjunctivae and EOM are normal. Right eye exhibits no discharge. Left eye exhibits no discharge. No scleral icterus.  Neck: Normal range of motion. Neck supple. No JVD present. No tracheal deviation present. No thyromegaly present.  Cardiovascular: Normal rate, regular rhythm, normal heart sounds and intact distal pulses. Exam reveals no gallop and no  friction rub.  No murmur heard. Pulmonary/Chest: Effort normal and breath sounds normal. No stridor. No respiratory distress. He has no wheezes. He has no rales. He exhibits no tenderness.  Abdominal: Soft. Bowel sounds are normal. He exhibits no distension and no mass. There is no abdominal tenderness. There is no rebound and no guarding. Hernia confirmed negative in the  right inguinal area and confirmed negative in the left inguinal area.  Musculoskeletal: Normal range of motion.        General: No tenderness or edema.  Lymphadenopathy:    He has no cervical adenopathy.  Neurological: He is alert and oriented to person, place, and time. He has normal reflexes. No cranial nerve deficit. He exhibits normal muscle tone. Coordination normal.  Skin: Skin is warm. No rash noted. He is not diaphoretic. No erythema. No pallor.  Psychiatric: He has a normal mood and affect. His behavior is normal. Judgment and thought content normal.  Vitals reviewed.    Lab on 05/08/2019  Component Date Value Ref Range Status  . Cholesterol 05/08/2019 155  <200 mg/dL Final  . HDL 05/08/2019 43  > OR = 40 mg/dL Final  . Triglycerides 05/08/2019 84  <150 mg/dL Final  . LDL Cholesterol (Calc) 05/08/2019 95  mg/dL (calc) Final   Comment: Reference range: <100 . Desirable range <100 mg/dL for primary prevention;   <70 mg/dL for patients with CHD or diabetic patients  with > or = 2 CHD risk factors. Marland Kitchen LDL-C is now calculated using the Martin-Hopkins  calculation, which is a validated novel method providing  better accuracy than the Friedewald equation in the  estimation of LDL-C.  Cresenciano Genre et al. Annamaria Helling. 7026;378(58): 2061-2068  (http://education.[facility].com/faq/FAQ164)   . Total CHOL/HDL Ratio 05/08/2019 3.6  <5.0 (calc) Final  . Non-HDL Cholesterol (Calc) 05/08/2019 112  <130 mg/dL (calc) Final   Comment: For patients with diabetes plus 1 major ASCVD risk  factor, treating to a non-HDL-C goal of <100 mg/dL  (LDL-C of <70 mg/dL) is considered a therapeutic  option.   . WBC 05/08/2019 6.5  3.8 - 10.8 Thousand/uL Final  . RBC 05/08/2019 5.00  4.20 - 5.80 Million/uL Final  . Hemoglobin 05/08/2019 15.6  13.2 - 17.1 g/dL Final  . HCT 05/08/2019 46.3  38.5 - 50.0 % Final  . MCV 05/08/2019 92.6  80.0 - 100.0 fL Final  . MCH 05/08/2019 31.2  27.0 - 33.0 pg Final  .  MCHC 05/08/2019 33.7  32.0 - 36.0 g/dL Final  . RDW 05/08/2019 12.0  11.0 - 15.0 % Final  . Platelets 05/08/2019 150  140 - 400 Thousand/uL Final  . MPV 05/08/2019 11.9  7.5 - 12.5 fL Final  . Neutro Abs 05/08/2019 3,738  1,500 - 7,800 cells/uL Final  . Lymphs Abs 05/08/2019 1,989  850 - 3,900 cells/uL Final  . Absolute Monocytes 05/08/2019 494  200 - 950 cells/uL Final  . Eosinophils Absolute 05/08/2019 228  15 - 500 cells/uL Final  . Basophils Absolute 05/08/2019 52  0 - 200 cells/uL Final  . Neutrophils Relative % 05/08/2019 57.5  % Final  . Total Lymphocyte 05/08/2019 30.6  % Final  . Monocytes Relative 05/08/2019 7.6  % Final  . Eosinophils Relative 05/08/2019 3.5  % Final  . Basophils Relative 05/08/2019 0.8  % Final  . Glucose, Bld 05/08/2019 91  65 - 99 mg/dL Final   Comment: .            Fasting reference interval .   .  BUN 05/08/2019 18  7 - 25 mg/dL Final  . Creat 05/08/2019 1.19  0.70 - 1.33 mg/dL Final   Comment: For patients >23 years of age, the reference limit for Creatinine is approximately 13% higher for people identified as African-American. .   . GFR, Est Non African American 05/08/2019 70  > OR = 60 mL/min/1.70m2 Final  . GFR, Est African American 05/08/2019 81  > OR = 60 mL/min/1.6m2 Final  . BUN/Creatinine Ratio 63/84/6659 NOT APPLICABLE  6 - 22 (calc) Final  . Sodium 05/08/2019 141  135 - 146 mmol/L Final  . Potassium 05/08/2019 4.7  3.5 - 5.3 mmol/L Final  . Chloride 05/08/2019 106  98 - 110 mmol/L Final  . CO2 05/08/2019 29  20 - 32 mmol/L Final  . Calcium 05/08/2019 9.6  8.6 - 10.3 mg/dL Final  . Total Protein 05/08/2019 6.7  6.1 - 8.1 g/dL Final  . Albumin 05/08/2019 4.4  3.6 - 5.1 g/dL Final  . Globulin 05/08/2019 2.3  1.9 - 3.7 g/dL (calc) Final  . AG Ratio 05/08/2019 1.9  1.0 - 2.5 (calc) Final  . Total Bilirubin 05/08/2019 1.4* 0.2 - 1.2 mg/dL Final  . Alkaline phosphatase (APISO) 05/08/2019 61  35 - 144 U/L Final  . AST 05/08/2019 27  10 - 35  U/L Final  . ALT 05/08/2019 27  9 - 46 U/L Final  . PSA 05/08/2019 0.1  < OR = 4.0 ng/mL Final   Comment: The total PSA value from this assay system is  standardized against the WHO standard. The test  result will be approximately 20% lower when compared  to the equimolar-standardized total PSA (Beckman  Coulter). Comparison of serial PSA results should be  interpreted with this fact in mind. . This test was performed using the Siemens  chemiluminescent method. Values obtained from  different assay methods cannot be used interchangeably. PSA levels, regardless of value, should not be interpreted as absolute evidence of the presence or absence of disease.         Assessment & Plan:  1. Actinic keratosis The lesion on the crown of his head is concerning.  I have recommended dermatology consultation for excision versus cryotherapy.  I suspect squamous cell carcinoma - Ambulatory referral to Dermatology  2. General medical exam Colon cancer screening is up-to-date.  PSA is 0.1 and virtually undetectable.  Recommended the patient receive the shingles vaccine as well as a seasonal flu shot when available.  Otherwise his preventative care is up-to-date  3. Pure hypercholesterolemia Cholesterol is outstanding however the patient may be experiencing side effects from his statin.  Therefore I recommended he discontinue his statin for 4 weeks and call us with an update to see if the symptoms have improved  4. History of Hodgkin's lymphoma Manage per oncology however in remission

## 2019-05-22 ENCOUNTER — Other Ambulatory Visit: Payer: Self-pay | Admitting: Family Medicine

## 2019-10-13 ENCOUNTER — Telehealth: Payer: Self-pay

## 2019-10-13 NOTE — Telephone Encounter (Signed)
If he is eligible he can get it

## 2019-10-13 NOTE — Telephone Encounter (Signed)
Wife called and left a message. Can her husband get the COVID vaccine?

## 2019-10-13 NOTE — Telephone Encounter (Signed)
Called and gave wife below message. She verbalized understanding.

## 2019-11-11 ENCOUNTER — Other Ambulatory Visit: Payer: Self-pay | Admitting: Hematology and Oncology

## 2019-11-11 DIAGNOSIS — Z8571 Personal history of Hodgkin lymphoma: Secondary | ICD-10-CM

## 2019-11-13 ENCOUNTER — Other Ambulatory Visit: Payer: Self-pay

## 2019-11-13 ENCOUNTER — Inpatient Hospital Stay: Payer: 59 | Attending: Hematology and Oncology

## 2019-11-13 ENCOUNTER — Encounter: Payer: Self-pay | Admitting: Hematology and Oncology

## 2019-11-13 ENCOUNTER — Inpatient Hospital Stay (HOSPITAL_BASED_OUTPATIENT_CLINIC_OR_DEPARTMENT_OTHER): Payer: 59 | Admitting: Hematology and Oncology

## 2019-11-13 DIAGNOSIS — Z8571 Personal history of Hodgkin lymphoma: Secondary | ICD-10-CM

## 2019-11-13 DIAGNOSIS — Z9221 Personal history of antineoplastic chemotherapy: Secondary | ICD-10-CM | POA: Insufficient documentation

## 2019-11-13 DIAGNOSIS — C8195 Hodgkin lymphoma, unspecified, lymph nodes of inguinal region and lower limb: Secondary | ICD-10-CM | POA: Diagnosis present

## 2019-11-13 DIAGNOSIS — Z79899 Other long term (current) drug therapy: Secondary | ICD-10-CM | POA: Diagnosis not present

## 2019-11-13 LAB — CBC WITH DIFFERENTIAL/PLATELET
Abs Immature Granulocytes: 0.02 10*3/uL (ref 0.00–0.07)
Basophils Absolute: 0.1 10*3/uL (ref 0.0–0.1)
Basophils Relative: 1 %
Eosinophils Absolute: 0.2 10*3/uL (ref 0.0–0.5)
Eosinophils Relative: 3 %
HCT: 48.5 % (ref 39.0–52.0)
Hemoglobin: 16.3 g/dL (ref 13.0–17.0)
Immature Granulocytes: 0 %
Lymphocytes Relative: 29 %
Lymphs Abs: 1.9 10*3/uL (ref 0.7–4.0)
MCH: 30.4 pg (ref 26.0–34.0)
MCHC: 33.6 g/dL (ref 30.0–36.0)
MCV: 90.5 fL (ref 80.0–100.0)
Monocytes Absolute: 0.5 10*3/uL (ref 0.1–1.0)
Monocytes Relative: 7 %
Neutro Abs: 4 10*3/uL (ref 1.7–7.7)
Neutrophils Relative %: 60 %
Platelets: 172 10*3/uL (ref 150–400)
RBC: 5.36 MIL/uL (ref 4.22–5.81)
RDW: 11.9 % (ref 11.5–15.5)
WBC: 6.7 10*3/uL (ref 4.0–10.5)
nRBC: 0 % (ref 0.0–0.2)

## 2019-11-13 LAB — COMPREHENSIVE METABOLIC PANEL
ALT: 23 U/L (ref 0–44)
AST: 23 U/L (ref 15–41)
Albumin: 4.3 g/dL (ref 3.5–5.0)
Alkaline Phosphatase: 65 U/L (ref 38–126)
Anion gap: 8 (ref 5–15)
BUN: 19 mg/dL (ref 6–20)
CO2: 30 mmol/L (ref 22–32)
Calcium: 9.6 mg/dL (ref 8.9–10.3)
Chloride: 104 mmol/L (ref 98–111)
Creatinine, Ser: 1.38 mg/dL — ABNORMAL HIGH (ref 0.61–1.24)
GFR calc Af Amer: >60 mL/min (ref >60)
GFR calc non Af Amer: 58 mL/min — ABNORMAL LOW (ref >60)
Glucose, Bld: 99 mg/dL (ref 70–99)
Potassium: 4.8 mmol/L (ref 3.5–5.1)
Sodium: 142 mmol/L (ref 135–145)
Total Bilirubin: 1.4 mg/dL — ABNORMAL HIGH (ref 0.3–1.2)
Total Protein: 7.3 g/dL (ref 6.5–8.1)

## 2019-11-13 LAB — LACTATE DEHYDROGENASE: LDH: 175 U/L (ref 98–192)

## 2019-11-13 NOTE — Assessment & Plan Note (Signed)
His last CT scan on November 22, 2016 showed no evidence of active disease. His examination and blood work is benign He has no signs of cancer recurrence We discussed cancer survivorship issues  I will discharge him from the clinic The patient can call me to schedule future appointment if needed

## 2019-11-13 NOTE — Progress Notes (Signed)
[name] OFFICE PROGRESS NOTE  Patient Care Team: [name], MD as PCP - General (Family Medicine)  ASSESSMENT & PLAN:  History of hodgkin's lymphoma His last CT scan on November 22, 2016 showed no evidence of active disease. His examination and blood work is benign He has no signs of cancer recurrence We discussed cancer survivorship issues  I will discharge him from the clinic The patient can call me to schedule future appointment if needed   No orders of the defined types were placed in this encounter.   All questions were answered. The patient knows to call the clinic with any problems, questions or concerns. The total time spent in the appointment was 10 minutes encounter with patients including review of chart and various tests results, discussions about plan of care and coordination of care plan   Keith Lark, MD 11/13/2019 9:19 AM  INTERVAL HISTORY: Please see below for problem oriented charting. He returns for lymphoma follow-up He feels well No new lymphadenopathy Appetite stable No recent infection, fever or chills  SUMMARY OF ONCOLOGIC HISTORY: Oncology History Overview Note  Hodgkin lymphoma   Staging form: Lymphoid Neoplasms, AJCC 6th Edition     Clinical stage from 09/13/2014: Stage III - Signed by Keith Lark, MD on 09/13/2014     History of Hodgkin's lymphoma  09/04/2014 Pathology Results   Accession: [id number] inguinal lymph node biopsy confirmed Hodgkin lymphoma   09/04/2014 Procedure   Dr. Dalbert Batman performed excision of left inguinal lymph node biopsy   09/11/2014 Imaging   CT scan of the chest, abdomen and pelvis show disease both above and below the diaphragm   09/16/2014 Imaging   Echocardiogram is negative   09/18/2014 Bone Marrow Biopsy   Bone marrow aspirate and biopsy is negative   09/22/2014 - 03/03/2015 Chemotherapy   Patient completed 6 cycles of ABVD. Bleomycin was discontinued in March 2016   10/06/2014  Adverse Reaction    treatment was delayed due to severe leukopenia. Future dose modification was made for abnormal liver function tests, leukopenia and neuropathy   11/23/2014 Imaging   PET CT scan showed near complete response to Rx   12/23/2014 Adverse Reaction   Bleomycin was discontinued due to suspected bleomycin toxicity   04/25/2015 Imaging   US venous Doppller left upper extremity showed acute DVT noted in left internal jugular vein. All other veins appear thrombus free   04/27/2015 Surgery   He had port removal.   05/04/2015 Imaging   PET CT scan showed no evidence of disease.   11/15/2015 Imaging   Stable mild right internal mammary lymphadenopathy with mild hypermetabolic activity, Deauville score 2.   05/15/2016 Imaging   Ct scan showed no new or progressive lymphoma in the chest, abdomen or pelvis. Stable mildly enlarged right internal mammary lymph node, suggesting treated lymphoma   11/20/2016 Imaging   Stable 10 mm right internal mammary node, unchanged, likely related to treated lymphoma. No findings suggestive of active lymphoma in the chest, abdomen, or pelvis.     REVIEW OF SYSTEMS:   Constitutional: Denies fevers, chills or abnormal weight loss Eyes: Denies blurriness of vision Ears, nose, mouth, throat, and face: Denies mucositis or sore throat Respiratory: Denies cough, dyspnea or wheezes Cardiovascular: Denies palpitation, chest discomfort or lower extremity swelling Gastrointestinal:  Denies nausea, heartburn or change in bowel habits Skin: Denies abnormal skin rashes Lymphatics: Denies new lymphadenopathy or easy bruising Neurological:Denies numbness, tingling or new weaknesses Behavioral/Psych: Mood is stable, no new changes  All other systems were reviewed with the patient and are negative.  I have reviewed the past medical history, past surgical history, social history and family history with the patient and they are unchanged from previous  note.  ALLERGIES:  has No Known Allergies.  MEDICATIONS:  Current Outpatient Medications  Medication Sig Dispense Refill  . aspirin EC 81 MG tablet Take 81 mg by mouth every other day.    . cholecalciferol (VITAMIN D) 1000 units tablet Take 2,000 Units by mouth daily.     . Multiple Vitamin (MULTIVITAMIN) tablet Take 1 tablet by mouth daily. occasional     No current facility-administered medications for this visit.    PHYSICAL EXAMINATION: ECOG PERFORMANCE STATUS: 0 - Asymptomatic  Vitals:   11/13/19 0825  BP: 113/68  Pulse: 65  Resp: 20  Temp: (!) 97.3 F (36.3 C)  SpO2: 99%   Filed Weights   11/13/19 0825  Weight: 208 lb 11.2 oz (94.7 kg)    GENERAL:alert, no distress and comfortable SKIN: skin color, texture, turgor are normal, no rashes or significant lesions EYES: normal, Conjunctiva are pink and non-injected, sclera clear OROPHARYNX:no exudate, no erythema and lips, buccal mucosa, and tongue normal  NECK: supple, thyroid normal size, non-tender, without nodularity LYMPH:  no palpable lymphadenopathy in the cervical, axillary or inguinal LUNGS: clear to auscultation and percussion with normal breathing effort HEART: regular rate & rhythm and no murmurs and no lower extremity edema ABDOMEN:abdomen soft, non-tender and normal bowel sounds Musculoskeletal:no cyanosis of digits and no clubbing  NEURO: alert & oriented x 3 with fluent speech, no focal motor/sensory deficits  LABORATORY DATA:  I have reviewed the data as listed    Component Value Date/Time   NA 142 11/13/2019 0807   NA 141 05/10/2017 0822   K 4.8 11/13/2019 0807   K 4.3 05/10/2017 0822   CL 104 11/13/2019 0807   CO2 30 11/13/2019 0807   CO2 27 05/10/2017 0822   GLUCOSE 99 11/13/2019 0807   GLUCOSE 80 05/10/2017 0822   BUN 19 11/13/2019 0807   BUN 17.8 05/10/2017 0822   CREATININE 1.38 (H) 11/13/2019 0807   CREATININE 1.19 05/08/2019 0815   CREATININE 1.2 05/10/2017 0822   CALCIUM 9.6  11/13/2019 0807   CALCIUM 9.8 05/10/2017 0822   PROT 7.3 11/13/2019 0807   PROT 6.8 05/10/2017 0822   ALBUMIN 4.3 11/13/2019 0807   ALBUMIN 3.9 05/10/2017 0822   AST 23 11/13/2019 0807   AST 25 05/10/2017 0822   ALT 23 11/13/2019 0807   ALT 24 05/10/2017 0822   ALKPHOS 65 11/13/2019 0807   ALKPHOS 65 05/10/2017 0822   BILITOT 1.4 (H) 11/13/2019 0807   BILITOT 1.71 (H) 05/10/2017 0822   GFRNONAA 58 (L) 11/13/2019 0807   GFRNONAA 70 05/08/2019 0815   GFRAA >60 11/13/2019 0807   GFRAA 81 05/08/2019 0815    No results found for: SPEP, UPEP  Lab Results  Component Value Date   WBC 6.7 11/13/2019   NEUTROABS 4.0 11/13/2019   HGB 16.3 11/13/2019   HCT 48.5 11/13/2019   MCV 90.5 11/13/2019   PLT 172 11/13/2019      Chemistry      Component Value Date/Time   NA 142 11/13/2019 0807   NA 141 05/10/2017 0822   K 4.8 11/13/2019 0807   K 4.3 05/10/2017 0822   CL 104 11/13/2019 0807   CO2 30 11/13/2019 0807   CO2 27 05/10/2017 0822   BUN 19 11/13/2019 0807  BUN 17.8 05/10/2017 0822   CREATININE 1.38 (H) 11/13/2019 0807   CREATININE 1.19 05/08/2019 0815   CREATININE 1.2 05/10/2017 0822      Component Value Date/Time   CALCIUM 9.6 11/13/2019 0807   CALCIUM 9.8 05/10/2017 0822   ALKPHOS 65 11/13/2019 0807   ALKPHOS 65 05/10/2017 0822   AST 23 11/13/2019 0807   AST 25 05/10/2017 0822   ALT 23 11/13/2019 0807   ALT 24 05/10/2017 0822   BILITOT 1.4 (H) 11/13/2019 0807   BILITOT 1.71 (H) 05/10/2017 0301

## 2020-05-31 ENCOUNTER — Other Ambulatory Visit: Payer: 59

## 2020-05-31 ENCOUNTER — Other Ambulatory Visit: Payer: Self-pay

## 2020-05-31 DIAGNOSIS — L570 Actinic keratosis: Secondary | ICD-10-CM

## 2020-05-31 DIAGNOSIS — E7800 Pure hypercholesterolemia, unspecified: Secondary | ICD-10-CM

## 2020-05-31 DIAGNOSIS — Z0000 Encounter for general adult medical examination without abnormal findings: Secondary | ICD-10-CM

## 2020-05-31 DIAGNOSIS — Z125 Encounter for screening for malignant neoplasm of prostate: Secondary | ICD-10-CM

## 2020-06-01 LAB — LIPID PANEL
Cholesterol: 248 mg/dL — ABNORMAL HIGH (ref <200)
HDL: 51 mg/dL (ref >=40)
LDL Cholesterol (Calc): 173 mg/dL (calc) — ABNORMAL HIGH
Non-HDL Cholesterol (Calc): 197 mg/dL (calc) — ABNORMAL HIGH (ref <130)
Total CHOL/HDL Ratio: 4.9 (calc) (ref <5.0)
Triglycerides: 110 mg/dL (ref <150)

## 2020-06-01 LAB — CBC WITH DIFFERENTIAL/PLATELET
Absolute Monocytes: 580 cells/uL (ref 200–950)
Basophils Absolute: 62 cells/uL (ref 0–200)
Basophils Relative: 0.9 %
Eosinophils Absolute: 193 cells/uL (ref 15–500)
Eosinophils Relative: 2.8 %
HCT: 46.9 % (ref 38.5–50.0)
Hemoglobin: 15.6 g/dL (ref 13.2–17.1)
Lymphs Abs: 2029 cells/uL (ref 850–3900)
MCH: 31.3 pg (ref 27.0–33.0)
MCHC: 33.3 g/dL (ref 32.0–36.0)
MCV: 94.2 fL (ref 80.0–100.0)
MPV: 11.9 fL (ref 7.5–12.5)
Monocytes Relative: 8.4 %
Neutro Abs: 4037 cells/uL (ref 1500–7800)
Neutrophils Relative %: 58.5 %
Platelets: 160 10*3/uL (ref 140–400)
RBC: 4.98 10*6/uL (ref 4.20–5.80)
RDW: 11.9 % (ref 11.0–15.0)
Total Lymphocyte: 29.4 %
WBC: 6.9 10*3/uL (ref 3.8–10.8)

## 2020-06-01 LAB — COMPLETE METABOLIC PANEL WITH GFR
AG Ratio: 1.7 (calc) (ref 1.0–2.5)
ALT: 17 U/L (ref 9–46)
AST: 22 U/L (ref 10–35)
Albumin: 4.2 g/dL (ref 3.6–5.1)
Alkaline phosphatase (APISO): 59 U/L (ref 35–144)
BUN: 17 mg/dL (ref 7–25)
CO2: 27 mmol/L (ref 20–32)
Calcium: 9.4 mg/dL (ref 8.6–10.3)
Chloride: 104 mmol/L (ref 98–110)
Creat: 1.19 mg/dL (ref 0.70–1.33)
GFR, Est African American: 80 mL/min/{1.73_m2} (ref >=60)
GFR, Est Non African American: 69 mL/min/{1.73_m2} (ref >=60)
Globulin: 2.5 g/dL (calc) (ref 1.9–3.7)
Glucose, Bld: 90 mg/dL (ref 65–99)
Potassium: 4.3 mmol/L (ref 3.5–5.3)
Sodium: 138 mmol/L (ref 135–146)
Total Bilirubin: 1.4 mg/dL — ABNORMAL HIGH (ref 0.2–1.2)
Total Protein: 6.7 g/dL (ref 6.1–8.1)

## 2020-06-01 LAB — PSA: PSA: 0.1 ng/mL (ref <=4.0)

## 2020-06-03 ENCOUNTER — Ambulatory Visit: Payer: 59 | Admitting: Family Medicine

## 2020-06-03 ENCOUNTER — Other Ambulatory Visit: Payer: Self-pay

## 2020-06-03 VITALS — BP 118/70 | HR 63 | Temp 96.3°F | Ht 70.0 in | Wt 210.0 lb

## 2020-06-03 DIAGNOSIS — E7800 Pure hypercholesterolemia, unspecified: Secondary | ICD-10-CM | POA: Diagnosis not present

## 2020-06-03 DIAGNOSIS — Z8571 Personal history of Hodgkin lymphoma: Secondary | ICD-10-CM | POA: Diagnosis not present

## 2020-06-03 DIAGNOSIS — Z0001 Encounter for general adult medical examination with abnormal findings: Secondary | ICD-10-CM | POA: Diagnosis not present

## 2020-06-03 DIAGNOSIS — I2510 Atherosclerotic heart disease of native coronary artery without angina pectoris: Secondary | ICD-10-CM | POA: Diagnosis not present

## 2020-06-03 DIAGNOSIS — R5382 Chronic fatigue, unspecified: Secondary | ICD-10-CM

## 2020-06-03 DIAGNOSIS — Z0000 Encounter for general adult medical examination without abnormal findings: Secondary | ICD-10-CM

## 2020-06-03 MED ORDER — ROSUVASTATIN CALCIUM 10 MG PO TABS: 10.0000 mg | ORAL_TABLET | Freq: Every day | ORAL | 3 refills | Status: DC

## 2020-06-03 NOTE — Progress Notes (Signed)
Subjective:    Patient ID: Keith Forbes, male    DOB: June 14, 1966, 54 y.o.   MRN: [id number]  HPI  Patient is a 54 year old Caucasian male here today for complete physical exam.  Past medical history is significant for history of Hodgkin's disease in 2015.  He completed 6 months of ABVD therapy and his Hodgkin's disease remains in remission. He is following up regularly with his oncologist, Dr. Alvy Bimler.  Treatment was complicated by a DVT in his neck and upper left arm near his Port-A-Cath site. This was treated with 3 months of xarelto and immediate removal of the Port-A-Cath by surgery.  Patient has a history of mild atherosclerotic calcifications seen on a screening CT of the chest in 2018.  However he denies any chest pain shortness of breath dyspnea on exertion.  Patient had a colonoscopy that was completely clear in 2018.  At his last visit we stopped his cholesterol medication due to memory issues.  Then he was taking Lipitor.  However, as the lab work below shows, the patient has a very high LDL cholesterol.  He has had his Covid vaccine.  He is not due for a booster until November.  He does report chronic fatigue.  By the end of the week, he states that he just does not have any energy to do anything.  He still works out on a daily basis.  He eats healthy.  He exercises regularly.  He just loses physical strength quicker than before. Immunization History  Administered Date(s) Administered   Influenza Split 07/02/2013   Influenza-Unspecified 07/02/2014, 07/03/2015, 06/29/2016   Pneumococcal Polysaccharide-23 08/25/2015   Tdap 08/03/2008, 05/09/2018   Lab on 05/31/2020  Component Date Value Ref Range Status   WBC 05/31/2020 6.9  3.8 - 10.8 Thousand/uL Final   RBC 05/31/2020 4.98  4.20 - 5.80 Million/uL Final   Hemoglobin 05/31/2020 15.6  13.2 - 17.1 g/dL Final   HCT 05/31/2020 46.9  38 - 50 % Final   MCV 05/31/2020 94.2  80.0 - 100.0 fL Final   MCH 05/31/2020 31.3  27.0 -  33.0 pg Final   MCHC 05/31/2020 33.3  32.0 - 36.0 g/dL Final   RDW 05/31/2020 11.9  11.0 - 15.0 % Final   Platelets 05/31/2020 160  140 - 400 Thousand/uL Final   MPV 05/31/2020 11.9  7.5 - 12.5 fL Final   Neutro Abs 05/31/2020 4,037  1,500 - 7,800 cells/uL Final   Lymphs Abs 05/31/2020 2,029  850 - 3,900 cells/uL Final   Absolute Monocytes 05/31/2020 580  200 - 950 cells/uL Final   Eosinophils Absolute 05/31/2020 193  15 - 500 cells/uL Final   Basophils Absolute 05/31/2020 62  0 - 200 cells/uL Final   Neutrophils Relative % 05/31/2020 58.5  % Final   Total Lymphocyte 05/31/2020 29.4  % Final   Monocytes Relative 05/31/2020 8.4  % Final   Eosinophils Relative 05/31/2020 2.8  % Final   Basophils Relative 05/31/2020 0.9  % Final   Glucose, Bld 05/31/2020 90  65 - 99 mg/dL Final   Comment: .            Fasting reference interval .    BUN 05/31/2020 17  7 - 25 mg/dL Final   Creat 05/31/2020 1.19  0.70 - 1.33 mg/dL Final   Comment: For patients >79 years of age, the reference limit for Creatinine is approximately 13% higher for people identified as African-American. .    GFR, Est Non African American 05/31/2020  69  > OR = 60 mL/min/1.2m2 Final   GFR, Est African American 05/31/2020 80  > OR = 60 mL/min/1.40m2 Final   BUN/Creatinine Ratio 11/91/4782 NOT APPLICABLE  6 - 22 (calc) Final   Sodium 05/31/2020 138  135 - 146 mmol/L Final   Potassium 05/31/2020 4.3  3.5 - 5.3 mmol/L Final   Chloride 05/31/2020 104  98 - 110 mmol/L Final   CO2 05/31/2020 27  20 - 32 mmol/L Final   Calcium 05/31/2020 9.4  8.6 - 10.3 mg/dL Final   Total Protein 05/31/2020 6.7  6.1 - 8.1 g/dL Final   Albumin 05/31/2020 4.2  3.6 - 5.1 g/dL Final   Globulin 05/31/2020 2.5  1.9 - 3.7 g/dL (calc) Final   AG Ratio 05/31/2020 1.7  1.0 - 2.5 (calc) Final   Total Bilirubin 05/31/2020 1.4* 0.2 - 1.2 mg/dL Final   Alkaline phosphatase (APISO) 05/31/2020 59  35 - 144 U/L Final   AST  05/31/2020 22  10 - 35 U/L Final   ALT 05/31/2020 17  9 - 46 U/L Final   Cholesterol 05/31/2020 248* <200 mg/dL Final   HDL 05/31/2020 51  > OR = 40 mg/dL Final   Triglycerides 05/31/2020 110  <150 mg/dL Final   LDL Cholesterol (Calc) 05/31/2020 173* mg/dL (calc) Final   Comment: Reference range: <100 . Desirable range <100 mg/dL for primary prevention;   <70 mg/dL for patients with CHD or diabetic patients  with > or = 2 CHD risk factors. Marland Kitchen LDL-C is now calculated using the Martin-Hopkins  calculation, which is a validated novel method providing  better accuracy than the Friedewald equation in the  estimation of LDL-C.  Cresenciano Genre et al. Annamaria Helling. 9562;130(86): 2061-2068  (http://education.[facility].com/faq/FAQ164)    Total CHOL/HDL Ratio 05/31/2020 4.9  <5.0 (calc) Final   Non-HDL Cholesterol (Calc) 05/31/2020 197* <130 mg/dL (calc) Final   Comment: For patients with diabetes plus 1 major ASCVD risk  factor, treating to a non-HDL-C goal of <100 mg/dL  (LDL-C of <70 mg/dL) is considered a therapeutic  option.    PSA 05/31/2020 0.1  < OR = 4.0 ng/mL Final   Comment: The total PSA value from this assay system is  standardized against the WHO standard. The test  result will be approximately 20% lower when compared  to the equimolar-standardized total PSA (Beckman  Coulter). Comparison of serial PSA results should be  interpreted with this fact in mind. . This test was performed using the Siemens  chemiluminescent method. Values obtained from  different assay methods cannot be used interchangeably. PSA levels, regardless of value, should not be interpreted as absolute evidence of the presence or absence of disease.    Past Medical History:  Diagnosis Date   Bone lesion 05/05/2015   Cancer ([place])    hodgkin lymphoma   GERD (gastroesophageal reflux disease)    Hernia    Hodgkin lymphoma ([place]) 09/11/2014   Hypercholesteremia    Hyperlipidemia    Lymphoma  ([place])    Pars defect of lumbar spine    bilateral 2013 (Dr. Sherwood Gambler)   Wears contact lenses    Past Surgical History:  Procedure Laterality Date   CLOSED REDUCTION HAND FRACTURE     FINGER FRACTURE SURGERY     LYMPH NODE BIOPSY  12/15   PORT-A-CATH REMOVAL N/A 04/27/2015   Procedure: REMOVAL PORT-A-CATH;  Surgeon: Stark Klein, MD;  Location: [facility];  Service: General;  Laterality: N/A;   PORTACATH PLACEMENT Left 09/15/2014   Procedure:  INSERTION PORT-A-CATH WITH ULTRA SOUND;  Surgeon: Fanny Skates, MD;  Location: [facility];  Service: General;  Laterality: Left;   Current Outpatient Medications on File Prior to Visit  Medication Sig Dispense Refill   aspirin EC 81 MG tablet Take 81 mg by mouth every other day.     cholecalciferol (VITAMIN D) 1000 units tablet Take 2,000 Units by mouth daily.      Multiple Vitamin (MULTIVITAMIN) tablet Take 1 tablet by mouth daily. occasional     No current facility-administered medications on file prior to visit.   No Known Allergies Social History   Socioeconomic History   Marital status: Married    Spouse name: Not on file   Number of children: Not on file   Years of education: Not on file   Highest education level: Not on file  Occupational History   Not on file  Tobacco Use   Smoking status: Never Smoker   Smokeless tobacco: Never Used  Substance and Sexual Activity   Alcohol use: Yes    Comment: once every 2-3 months or less   Drug use: No   Sexual activity: Not on file    Comment: married, works in emergency services  Other Topics Concern   Not on file  Social History Narrative   Not on file   Social Determinants of Health   Financial Resource Strain:    Difficulty of Paying Living Expenses: Not on file  Food Insecurity:    Worried About Charity fundraiser in the Last Year: Not on file   [place] in the Last Year: Not on file  Transportation Needs:    Lack of  Transportation (Medical): Not on file   Lack of Transportation (Non-Medical): Not on file  Physical Activity:    Days of Exercise per Week: Not on file   Minutes of Exercise per Session: Not on file  Stress:    Feeling of Stress : Not on file  Social Connections:    Frequency of Communication with Friends and Family: Not on file   Frequency of Social Gatherings with Friends and Family: Not on file   Attends Religious Services: Not on file   Active Member of Clubs or Organizations: Not on file   Attends Archivist Meetings: Not on file   Marital Status: Not on file  Intimate Partner Violence:    Fear of Current or Ex-Partner: Not on file   Emotionally Abused: Not on file   Physically Abused: Not on file   Sexually Abused: Not on file   Family History  Problem Relation Age of Onset   Hyperlipidemia Father    Heart disease Father    Cancer Paternal Aunt        breast ca   Colon cancer Neg Hx   Father passed away from pulmonary fibrosis  Review of Systems  All other systems reviewed and are negative.      Objective:   Physical Exam Vitals reviewed.  Constitutional:      General: He is not in acute distress.    Appearance: He is well-developed. He is not diaphoretic.  HENT:     Head: Normocephalic and atraumatic.     Right Ear: External ear normal.     Left Ear: External ear normal.     Nose: Nose normal.     Mouth/Throat:     Pharynx: No oropharyngeal exudate.  Eyes:     General: No scleral icterus.  Right eye: No discharge.        Left eye: No discharge.     Conjunctiva/sclera: Conjunctivae normal.     Pupils: Pupils are equal, round, and reactive to light.  Neck:     Thyroid: No thyromegaly.     Vascular: No JVD.     Trachea: No tracheal deviation.  Cardiovascular:     Rate and Rhythm: Normal rate and regular rhythm.     Heart sounds: Normal heart sounds. No murmur heard.  No friction rub. No gallop.   Pulmonary:     Effort:  Pulmonary effort is normal. No respiratory distress.     Breath sounds: Normal breath sounds. No stridor. No wheezing or rales.  Chest:     Chest wall: No tenderness.  Abdominal:     General: Bowel sounds are normal. There is no distension.     Palpations: Abdomen is soft. There is no mass.     Tenderness: There is no abdominal tenderness. There is no guarding or rebound.     Hernia: There is no hernia in the left inguinal area.  Musculoskeletal:        General: No tenderness. Normal range of motion.     Cervical back: Normal range of motion and neck supple.  Lymphadenopathy:     Cervical: No cervical adenopathy.  Skin:    General: Skin is warm.     Coloration: Skin is not pale.     Findings: No erythema or rash.  Neurological:     Mental Status: He is alert and oriented to person, place, and time.     Cranial Nerves: No cranial nerve deficit.     Motor: No abnormal muscle tone.     Coordination: Coordination normal.     Deep Tendon Reflexes: Reflexes are normal and symmetric.  Psychiatric:        Behavior: Behavior normal.        Thought Content: Thought content normal.        Judgment: Judgment normal.           Assessment & Plan:  General medical exam  Pure hypercholesterolemia  History of Hodgkin's lymphoma  Coronary artery calcification seen on CAT scan  Chronic fatigue - Plan: TSH, Testosterone Total,Free,Bio, Males  PSA is outstanding.  There is no sign of prostate cancer.  Immunizations are up-to-date.  Recommended a booster on his Covid shot in November.  Start Crestor 10 mg a day and add co-Q10.  Recheck cholesterol in 3 months.  Given his fatigue, check a TSH as well as a testosterone level.  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  Colon cancer screening is up-to-date.  Recommended a flu shot today however the patient left before I can give it to him by accident.

## 2020-06-04 LAB — TESTOSTERONE TOTAL,FREE,BIO, MALES
Albumin: 4.5 g/dL (ref 3.6–5.1)
Sex Hormone Binding: 51 nmol/L — ABNORMAL HIGH (ref 10–50)
Testosterone, Bioavailable: 102.3 ng/dL — ABNORMAL LOW (ref >=110.0)
Testosterone, Free: 49.7 pg/mL (ref 46.0–224.0)
Testosterone: 535 ng/dL (ref 250–827)

## 2020-06-04 LAB — TSH: TSH: 0.26 mIU/L — ABNORMAL LOW (ref 0.40–4.50)

## 2020-08-09 ENCOUNTER — Ambulatory Visit: Payer: 59 | Admitting: Family Medicine

## 2020-08-20 ENCOUNTER — Other Ambulatory Visit: Payer: Self-pay

## 2020-08-20 ENCOUNTER — Ambulatory Visit: Payer: 59 | Admitting: Family Medicine

## 2020-08-20 VITALS — BP 110/70 | HR 70 | Temp 97.6°F | Ht 70.0 in | Wt 213.0 lb

## 2020-08-20 DIAGNOSIS — B9689 Other specified bacterial agents as the cause of diseases classified elsewhere: Secondary | ICD-10-CM

## 2020-08-20 DIAGNOSIS — J019 Acute sinusitis, unspecified: Secondary | ICD-10-CM | POA: Diagnosis not present

## 2020-08-20 MED ORDER — PREDNISONE 20 MG PO TABS: ORAL_TABLET | ORAL | 0 refills | Status: DC

## 2020-08-20 MED ORDER — AMOXICILLIN-POT CLAVULANATE 875-125 MG PO TABS: 1.0000 | ORAL_TABLET | Freq: Two times a day (BID) | ORAL | 0 refills | Status: DC

## 2020-08-20 NOTE — Progress Notes (Signed)
Subjective:    Patient ID: Keith Forbes, male    DOB: 08-21-1966, 54 y.o.   MRN: [id number]  HPI Patient is a very pleasant 54 year old Caucasian male who presents today with 3 to 4 weeks of sinus pressure.  The pressure is located in both maxillary sinuses.  He stated it started with allergies however it has progressed now to a constant pressure and constant pain with subjective fevers.  He tried amoxicillin in urgent care and had some short relief however the symptoms of come back just as severe. Past Medical History:  Diagnosis Date  . Bone lesion 05/05/2015  . Cancer [facility])    hodgkin lymphoma  . GERD (gastroesophageal reflux disease)   . Hernia   . Hodgkin lymphoma ([place]) 09/11/2014  . Hypercholesteremia   . Hyperlipidemia   . Lymphoma ([place])   . Pars defect of lumbar spine    bilateral 2013 (Dr. Sherwood Gambler)  . Wears contact lenses    Past Surgical History:  Procedure Laterality Date  . CLOSED REDUCTION HAND FRACTURE    . FINGER FRACTURE SURGERY    . LYMPH NODE BIOPSY  12/15  . PORT-A-CATH REMOVAL N/A 04/27/2015   Procedure: REMOVAL PORT-A-CATH;  Surgeon: Stark Klein, MD;  Location: [facility];  Service: General;  Laterality: N/A;  . PORTACATH PLACEMENT Left 09/15/2014   Procedure: INSERTION PORT-A-CATH WITH ULTRA SOUND;  Surgeon: Fanny Skates, MD;  Location: [facility];  Service: General;  Laterality: Left;   Current Outpatient Medications on File Prior to Visit  Medication Sig Dispense Refill  . aspirin EC 81 MG tablet Take 81 mg by mouth every other day.    . cholecalciferol (VITAMIN D) 1000 units tablet Take 2,000 Units by mouth daily.     . Multiple Vitamin (MULTIVITAMIN) tablet Take 1 tablet by mouth daily. occasional    . rosuvastatin (CRESTOR) 10 MG tablet Take 1 tablet (10 mg total) by mouth daily. 90 tablet 3   No current facility-administered medications on file prior to visit.   No Known Allergies Social History   Socioeconomic History  . Marital  status: Married    Spouse name: Not on file  . Number of children: Not on file  . Years of education: Not on file  . Highest education level: Not on file  Occupational History  . Not on file  Tobacco Use  . Smoking status: Never Smoker  . Smokeless tobacco: Never Used  Substance and Sexual Activity  . Alcohol use: Yes    Comment: once every 2-3 months or less  . Drug use: No  . Sexual activity: Not on file    Comment: married, works in emergency services  Other Topics Concern  . Not on file  Social History Narrative  . Not on file   Social Determinants of Health   Financial Resource Strain:   . Difficulty of Paying Living Expenses: Not on file  Food Insecurity:   . Worried About Charity fundraiser in the Last Year: Not on file  . Ran Out of Food in the Last Year: Not on file  Transportation Needs:   . Lack of Transportation (Medical): Not on file  . Lack of Transportation (Non-Medical): Not on file  Physical Activity:   . Days of Exercise per Week: Not on file  . Minutes of Exercise per Session: Not on file  Stress:   . Feeling of Stress : Not on file  Social Connections:   . Frequency of Communication with  Friends and Family: Not on file  . Frequency of Social Gatherings with Friends and Family: Not on file  . Attends Religious Services: Not on file  . Active Member of Clubs or Organizations: Not on file  . Attends Archivist Meetings: Not on file  . Marital Status: Not on file  Intimate Partner Violence:   . Fear of Current or Ex-Partner: Not on file  . Emotionally Abused: Not on file  . Physically Abused: Not on file  . Sexually Abused: Not on file      Review of Systems  All other systems reviewed and are negative.      Objective:   Physical Exam Vitals reviewed.  Constitutional:      General: He is not in acute distress.    Appearance: He is well-developed. He is not diaphoretic.  HENT:     Head: Normocephalic and atraumatic.     Right  Ear: External ear normal.     Left Ear: External ear normal.     Nose: Congestion present. No mucosal edema or rhinorrhea.     Right Sinus: Maxillary sinus tenderness present. No frontal sinus tenderness.     Left Sinus: Maxillary sinus tenderness present. No frontal sinus tenderness.     Mouth/Throat:     Pharynx: No oropharyngeal exudate.  Eyes:     General:        Right eye: No discharge.        Left eye: No discharge.  Cardiovascular:     Rate and Rhythm: Normal rate and regular rhythm.     Heart sounds: Normal heart sounds. No murmur heard.   Pulmonary:     Effort: Pulmonary effort is normal. No respiratory distress.     Breath sounds: Normal breath sounds. No stridor. No wheezing or rales.  Musculoskeletal:     Cervical back: Normal range of motion.           Assessment & Plan:  Acute bacterial rhinosinusitis  Patient has a sinus infection.  Begin Augmentin 875 mg p.o. twice daily for 10 days.  Add a prednisone taper pack.  Use a Nettie pot to help flush his sinuses and then reassess in 2 weeks if no better or sooner if worse

## 2021-06-11 ENCOUNTER — Encounter: Payer: Self-pay | Admitting: Emergency Medicine

## 2021-06-11 ENCOUNTER — Ambulatory Visit
Admission: EM | Admit: 2021-06-11 | Discharge: 2021-06-11 | Disposition: A | Discharge status: Home / Self Care | Payer: 59 | Attending: Emergency Medicine | Admitting: Emergency Medicine

## 2021-06-11 DIAGNOSIS — R0981 Nasal congestion: Secondary | ICD-10-CM | POA: Diagnosis not present

## 2021-06-11 MED ORDER — AMOXICILLIN-POT CLAVULANATE 875-125 MG PO TABS: 1.0000 | ORAL_TABLET | Freq: Two times a day (BID) | ORAL | 0 refills | Status: AC

## 2021-06-11 NOTE — Discharge Instructions (Signed)
Get plenty of rest and push fluids Augmentin prescribed for possible sinus infection.  Begin taking in the next day or two if symptoms do not improve Use OTC zyrtec for nasal congestion, runny nose, and/or sore throat Use OTC flonase for nasal congestion and runny nose Use medications daily for symptom relief Use OTC medications like ibuprofen or tylenol as needed fever or pain Call or go to the ED if you have any new or worsening symptoms such as fever, worsening cough, shortness of breath, chest tightness, chest pain, turning blue, changes in mental status, etc..Marland Kitchen

## 2021-06-11 NOTE — ED Provider Notes (Signed)
[facility]   [id number] 06/11/21 Arrival Time: [id number]   CC: Congestion  SUBJECTIVE: History from: patient.  Keith Forbes is a 55 y.o. male who presents with sore throat, congestion, and body aches x 4-5 days.  Denies sick exposure to COVID, flu or strep.  Has tried OTC medications without relief.  Worse in the morning.  Reports previous symptoms in the past with sinus infection.   Denies fever, chills, SOB, wheezing, chest pain, nausea, changes in bowel or bladder habits.    ROS: As per HPI.  All other pertinent ROS negative.     Past Medical History:  Diagnosis Date   Bone lesion 05/05/2015   Cancer ([place])    hodgkin lymphoma   GERD (gastroesophageal reflux disease)    Hernia    Hodgkin lymphoma ([place]) 09/11/2014   Hypercholesteremia    Hyperlipidemia    Lymphoma ([place])    Pars defect of lumbar spine    bilateral 2013 (Dr. Sherwood Gambler)   Wears contact lenses    Past Surgical History:  Procedure Laterality Date   CLOSED REDUCTION HAND FRACTURE     FINGER FRACTURE SURGERY     LYMPH NODE BIOPSY  12/15   PORT-A-CATH REMOVAL N/A 04/27/2015   Procedure: REMOVAL PORT-A-CATH;  Surgeon: Stark Klein, MD;  [facility];  Service: General;  Laterality: N/A;   PORTACATH PLACEMENT Left 09/15/2014   Procedure: INSERTION PORT-A-CATH WITH ULTRA SOUND;  Surgeon: Fanny Skates, MD;  Location: [facility];  Service: General;  Laterality: Left;   No Known Allergies No current facility-administered medications on file prior to encounter.   Current Outpatient Medications on File Prior to Encounter  Medication Sig Dispense Refill   aspirin EC 81 MG tablet Take 81 mg by mouth every other day.     cholecalciferol (VITAMIN D) 1000 units tablet Take 2,000 Units by mouth daily.      Multiple Vitamin (MULTIVITAMIN) tablet Take 1 tablet by mouth daily. occasional     rosuvastatin (CRESTOR) 10 MG tablet Take 1 tablet (10 mg total) by mouth daily. 90 tablet 3   Social  History   Socioeconomic History   Marital status: Married    Spouse name: Not on file   Number of children: Not on file   Years of education: Not on file   Highest education level: Not on file  Occupational History   Not on file  Tobacco Use   Smoking status: Never   Smokeless tobacco: Never  Substance and Sexual Activity   Alcohol use: Yes    Comment: once every 2-3 months or less   Drug use: No   Sexual activity: Not on file    Comment: married, works in emergency services  Other Topics Concern   Not on file  Social History Narrative   Not on file   Social Determinants of Health   Financial Resource Strain: Not on file  Food Insecurity: Not on file  Transportation Needs: Not on file  Physical Activity: Not on file  Stress: Not on file  Social Connections: Not on file  Intimate Partner Violence: Not on file   Family History  Problem Relation Age of Onset   Hyperlipidemia Father    Heart disease Father    Cancer Paternal Aunt        breast ca   Colon cancer Neg Hx     OBJECTIVE:  Vitals:   06/11/21 0856  BP: 125/82  Pulse: 90  Resp: 17  Temp: 99.3 F (  37.4 C)  TempSrc: Oral  SpO2: 96%    General appearance: alert; mildly fatigued appearing, nontoxic; speaking in full sentences and tolerating own secretions HEENT: NCAT; Ears: EACs clear, TMs pearly gray; Eyes: PERRL.  EOM grossly intact.Nose: nares patent without rhinorrhea, Throat: oropharynx clear, tonsils non erythematous or enlarged, uvula midline  Neck: supple without LAD Lungs: unlabored respirations, symmetrical air entry; cough: absent; no respiratory distress; CTAB Heart: regular rate and rhythm.  Skin: warm and dry Psychological: alert and cooperative; normal mood and affect   ASSESSMENT & PLAN:  1. Sinus congestion     Meds ordered this encounter  Medications   amoxicillin-clavulanate (AUGMENTIN) 875-125 MG tablet    Sig: Take 1 tablet by mouth every 12 (twelve) hours for 10 days.     Dispense:  20 tablet    Refill:  0    Order Specific Question:   Supervising Provider    Answer:   Raylene Everts [id number]    Get plenty of rest and push fluids Augmentin prescribed for possible sinus infection.  Begin taking in the next day or two if symptoms do not improve Use OTC zyrtec for nasal congestion, runny nose, and/or sore throat Use OTC flonase for nasal congestion and runny nose Use medications daily for symptom relief Use OTC medications like ibuprofen or tylenol as needed fever or pain Call or go to the ED if you have any new or worsening symptoms such as fever, worsening cough, shortness of breath, chest tightness, chest pain, turning blue, changes in mental status, etc...   Reviewed expectations re: course of current medical issues. Questions answered. Outlined signs and symptoms indicating need for more acute intervention. Patient verbalized understanding. After Visit Summary given.          Lestine Box, PA-C 06/11/21 [phone]

## 2021-06-11 NOTE — ED Triage Notes (Signed)
Sore throat, congestion and body aches that started on Wednesday.

## 2021-06-16 ENCOUNTER — Other Ambulatory Visit: Payer: Self-pay

## 2021-06-16 ENCOUNTER — Other Ambulatory Visit: Payer: 59

## 2021-06-16 DIAGNOSIS — Z13220 Encounter for screening for lipoid disorders: Secondary | ICD-10-CM

## 2021-06-16 DIAGNOSIS — E7800 Pure hypercholesterolemia, unspecified: Secondary | ICD-10-CM

## 2021-06-16 DIAGNOSIS — R7989 Other specified abnormal findings of blood chemistry: Secondary | ICD-10-CM

## 2021-06-16 DIAGNOSIS — D696 Thrombocytopenia, unspecified: Secondary | ICD-10-CM

## 2021-06-16 DIAGNOSIS — Z125 Encounter for screening for malignant neoplasm of prostate: Secondary | ICD-10-CM

## 2021-06-16 DIAGNOSIS — Z1159 Encounter for screening for other viral diseases: Secondary | ICD-10-CM

## 2021-06-17 LAB — HEPATITIS C ANTIBODY
Hepatitis C Ab: NONREACTIVE
SIGNAL TO CUT-OFF: 0.01 (ref <1.00)

## 2021-06-17 LAB — TSH: TSH: 0.38 mIU/L — ABNORMAL LOW (ref 0.40–4.50)

## 2021-06-17 LAB — PSA: PSA: 0.16 ng/mL (ref <=4.00)

## 2021-06-17 LAB — LIPID PANEL
Cholesterol: 174 mg/dL (ref <200)
HDL: 39 mg/dL — ABNORMAL LOW (ref >=40)
LDL Cholesterol (Calc): 116 mg/dL (calc) — ABNORMAL HIGH
Non-HDL Cholesterol (Calc): 135 mg/dL (calc) — ABNORMAL HIGH (ref <130)
Total CHOL/HDL Ratio: 4.5 (calc) (ref <5.0)
Triglycerides: 90 mg/dL (ref <150)

## 2021-06-17 LAB — COMPLETE METABOLIC PANEL WITH GFR
AG Ratio: 1.5 (calc) (ref 1.0–2.5)
ALT: 33 U/L (ref 9–46)
AST: 23 U/L (ref 10–35)
Albumin: 4.2 g/dL (ref 3.6–5.1)
Alkaline phosphatase (APISO): 68 U/L (ref 35–144)
BUN: 16 mg/dL (ref 7–25)
CO2: 29 mmol/L (ref 20–32)
Calcium: 9.8 mg/dL (ref 8.6–10.3)
Chloride: 102 mmol/L (ref 98–110)
Creat: 1.21 mg/dL (ref 0.70–1.30)
Globulin: 2.8 g/dL (calc) (ref 1.9–3.7)
Glucose, Bld: 84 mg/dL (ref 65–99)
Potassium: 4.4 mmol/L (ref 3.5–5.3)
Sodium: 139 mmol/L (ref 135–146)
Total Bilirubin: 0.9 mg/dL (ref 0.2–1.2)
Total Protein: 7 g/dL (ref 6.1–8.1)
eGFR: 71 mL/min/{1.73_m2} (ref >=60)

## 2021-06-17 LAB — CBC WITH DIFFERENTIAL/PLATELET
Absolute Monocytes: 630 cells/uL (ref 200–950)
Basophils Absolute: 60 cells/uL (ref 0–200)
Basophils Relative: 0.8 %
Eosinophils Absolute: 173 cells/uL (ref 15–500)
Eosinophils Relative: 2.3 %
HCT: 46.1 % (ref 38.5–50.0)
Hemoglobin: 15.2 g/dL (ref 13.2–17.1)
Lymphs Abs: 1980 cells/uL (ref 850–3900)
MCH: 30.9 pg (ref 27.0–33.0)
MCHC: 33 g/dL (ref 32.0–36.0)
MCV: 93.7 fL (ref 80.0–100.0)
MPV: 11.7 fL (ref 7.5–12.5)
Monocytes Relative: 8.4 %
Neutro Abs: 4658 cells/uL (ref 1500–7800)
Neutrophils Relative %: 62.1 %
Platelets: 213 10*3/uL (ref 140–400)
RBC: 4.92 10*6/uL (ref 4.20–5.80)
RDW: 11.8 % (ref 11.0–15.0)
Total Lymphocyte: 26.4 %
WBC: 7.5 10*3/uL (ref 3.8–10.8)

## 2021-06-21 ENCOUNTER — Encounter: Payer: Self-pay | Admitting: Family Medicine

## 2021-06-21 ENCOUNTER — Ambulatory Visit (INDEPENDENT_AMBULATORY_CARE_PROVIDER_SITE_OTHER): Payer: 59 | Admitting: Family Medicine

## 2021-06-21 ENCOUNTER — Other Ambulatory Visit: Payer: Self-pay

## 2021-06-21 VITALS — BP 118/68 | HR 60 | Temp 98.5°F | Resp 14 | Ht 70.0 in | Wt 214.0 lb

## 2021-06-21 DIAGNOSIS — E7800 Pure hypercholesterolemia, unspecified: Secondary | ICD-10-CM

## 2021-06-21 DIAGNOSIS — I2510 Atherosclerotic heart disease of native coronary artery without angina pectoris: Secondary | ICD-10-CM

## 2021-06-21 DIAGNOSIS — Z0000 Encounter for general adult medical examination without abnormal findings: Secondary | ICD-10-CM

## 2021-06-21 NOTE — Progress Notes (Signed)
Subjective:    Patient ID: Keith Forbes, male    DOB: 09/12/1966, 55 y.o.   MRN: [id number]  HPI  Patient is a 55 year old Caucasian male here today for complete physical exam.  Past medical history is significant for history of Hodgkin's disease in 2015.  He completed 6 months of ABVD therapy and his Hodgkin's disease remains in remission. Treatment was complicated by a DVT in his neck and upper left arm near his Port-A-Cath site. This was treated with 3 months of xarelto and immediate removal of the Port-A-Cath by surgery.  Patient has a history of mild atherosclerotic calcifications seen on a screening CT of the chest in 2018.  Patient is not due for repeat colonoscopy until 2028 as his last colonoscopy in 2018 was normal.  His recent PSA on his lab work was outstanding.  He is due for a flu shot but he prefers to get this at his employer.  He also is due for the shingles vaccine as well as a COVID booster Immunization History  Administered Date(s) Administered   Influenza Split 07/02/2013   Influenza-Unspecified 07/02/2014, 07/03/2015, 06/29/2016   Moderna Sars-Covid-2 Vaccination 10/03/2019   PFIZER(Purple Top)SARS-COV-2 Vaccination 11/17/2019, 12/15/2019   Pneumococcal Polysaccharide-23 08/25/2015   Tdap 08/03/2008, 05/09/2018   Lab on 06/16/2021  Component Date Value Ref Range Status   WBC 06/16/2021 7.5  3.8 - 10.8 Thousand/uL Final   RBC 06/16/2021 4.92  4.20 - 5.80 Million/uL Final   Hemoglobin 06/16/2021 15.2  13.2 - 17.1 g/dL Final   HCT 06/16/2021 46.1  38.5 - 50.0 % Final   MCV 06/16/2021 93.7  80.0 - 100.0 fL Final   MCH 06/16/2021 30.9  27.0 - 33.0 pg Final   MCHC 06/16/2021 33.0  32.0 - 36.0 g/dL Final   RDW 06/16/2021 11.8  11.0 - 15.0 % Final   Platelets 06/16/2021 213  140 - 400 Thousand/uL Final   MPV 06/16/2021 11.7  7.5 - 12.5 fL Final   Neutro Abs 06/16/2021 4,658  1,500 - 7,800 cells/uL Final   Lymphs Abs 06/16/2021 1,980  850 - 3,900 cells/uL Final    Absolute Monocytes 06/16/2021 630  200 - 950 cells/uL Final   Eosinophils Absolute 06/16/2021 173  15 - 500 cells/uL Final   Basophils Absolute 06/16/2021 60  0 - 200 cells/uL Final   Neutrophils Relative % 06/16/2021 62.1  % Final   Total Lymphocyte 06/16/2021 26.4  % Final   Monocytes Relative 06/16/2021 8.4  % Final   Eosinophils Relative 06/16/2021 2.3  % Final   Basophils Relative 06/16/2021 0.8  % Final   Glucose, Bld 06/16/2021 84  65 - 99 mg/dL Final   Comment: .            Fasting reference interval .    BUN 06/16/2021 16  7 - 25 mg/dL Final   Creat 06/16/2021 1.21  0.70 - 1.30 mg/dL Final   eGFR 06/16/2021 71  > OR = 60 mL/min/1.16m Final   Comment: The eGFR is based on the CKD-EPI 2021 equation. To calculate  the new eGFR from a previous Creatinine or Cystatin C result, go to https://www.kidney.org/professionals/ kdoqi/gfr%5Fcalculator    BUN/Creatinine Ratio 082/50/0370NOT APPLICABLE  6 - 22 (calc) Final   Sodium 06/16/2021 139  135 - 146 mmol/L Final   Potassium 06/16/2021 4.4  3.5 - 5.3 mmol/L Final   Chloride 06/16/2021 102  98 - 110 mmol/L Final   CO2 06/16/2021 29  20 - 32 mmol/L Final  Calcium 06/16/2021 9.8  8.6 - 10.3 mg/dL Final   Total Protein 06/16/2021 7.0  6.1 - 8.1 g/dL Final   Albumin 06/16/2021 4.2  3.6 - 5.1 g/dL Final   Globulin 06/16/2021 2.8  1.9 - 3.7 g/dL (calc) Final   AG Ratio 06/16/2021 1.5  1.0 - 2.5 (calc) Final   Total Bilirubin 06/16/2021 0.9  0.2 - 1.2 mg/dL Final   Alkaline phosphatase (APISO) 06/16/2021 68  35 - 144 U/L Final   AST 06/16/2021 23  10 - 35 U/L Final   ALT 06/16/2021 33  9 - 46 U/L Final   Hepatitis C Ab 06/16/2021 NON-REACTIVE  NON-REACTIVE Final   SIGNAL TO CUT-OFF 06/16/2021 0.01  <1.00 Final   Comment: . HCV antibody was non-reactive. There is no laboratory  evidence of HCV infection. . In most cases, no further action is required. However, if recent HCV exposure is suspected, a test for HCV RNA (test code  [phone]) is suggested. . For additional information please refer to http://education.questdiagnostics.com/faq/FAQ22v1 (This link is being provided for informational/ educational purposes only.) .    Cholesterol 06/16/2021 174  <200 mg/dL Final   HDL 06/16/2021 39 (A) > OR = 40 mg/dL Final   Triglycerides 06/16/2021 90  <150 mg/dL Final   LDL Cholesterol (Calc) 06/16/2021 116 (A) mg/dL (calc) Final   Comment: Reference range: <100 . Desirable range <100 mg/dL for primary prevention;   <70 mg/dL for patients with CHD or diabetic patients  with > or = 2 CHD risk factors. Marland Kitchen LDL-C is now calculated using the Martin-Hopkins  calculation, which is a validated novel method providing  better accuracy than the Friedewald equation in the  estimation of LDL-C.  Cresenciano Genre et al. Annamaria Helling. 0240;973(53): 2061-2068  (http://education.[facility].com/faq/FAQ164)    Total CHOL/HDL Ratio 06/16/2021 4.5  <5.0 (calc) Final   Non-HDL Cholesterol (Calc) 06/16/2021 135 (A) <130 mg/dL (calc) Final   Comment: For patients with diabetes plus 1 major ASCVD risk  factor, treating to a non-HDL-C goal of <100 mg/dL  (LDL-C of <70 mg/dL) is considered a therapeutic  option.    PSA 06/16/2021 0.16  < OR = 4.00 ng/mL Final   Comment: The total PSA value from this assay system is  standardized against the WHO standard. The test  result will be approximately 20% lower when compared  to the equimolar-standardized total PSA (Beckman  Coulter). Comparison of serial PSA results should be  interpreted with this fact in mind. . This test was performed using the Siemens  chemiluminescent method. Values obtained from  different assay methods cannot be used interchangeably. PSA levels, regardless of value, should not be interpreted as absolute evidence of the presence or absence of disease.    TSH 06/16/2021 0.38 (A) 0.40 - 4.50 mIU/L Final   Past Medical History:  Diagnosis Date   Bone lesion 05/05/2015    Cancer ([place])    hodgkin lymphoma   GERD (gastroesophageal reflux disease)    Hernia    Hodgkin lymphoma ([place]) 09/11/2014   Hypercholesteremia    Hyperlipidemia    Lymphoma ([place])    Pars defect of lumbar spine    bilateral 2013 (Dr. Sherwood Gambler)   Wears contact lenses    Past Surgical History:  Procedure Laterality Date   CLOSED REDUCTION HAND FRACTURE     FINGER FRACTURE SURGERY     LYMPH NODE BIOPSY  12/15   PORT-A-CATH REMOVAL N/A 04/27/2015   Procedure: REMOVAL PORT-A-CATH;  Surgeon: Stark Klein, MD;  Location: [facility];  Service: General;  Laterality: N/A;   PORTACATH PLACEMENT Left 09/15/2014   Procedure: INSERTION PORT-A-CATH WITH ULTRA SOUND;  Surgeon: Fanny Skates, MD;  Location: [facility];  Service: General;  Laterality: Left;   Current Outpatient Medications on File Prior to Visit  Medication Sig Dispense Refill   amoxicillin-clavulanate (AUGMENTIN) 875-125 MG tablet Take 1 tablet by mouth every 12 (twelve) hours for 10 days. 20 tablet 0   aspirin EC 81 MG tablet Take 81 mg by mouth every other day.     cholecalciferol (VITAMIN D) 1000 units tablet Take 2,000 Units by mouth daily.      Multiple Vitamin (MULTIVITAMIN) tablet Take 1 tablet by mouth daily. occasional     rosuvastatin (CRESTOR) 10 MG tablet Take 1 tablet (10 mg total) by mouth daily. 90 tablet 3   No current facility-administered medications on file prior to visit.   No Known Allergies Social History   Socioeconomic History   Marital status: Married    Spouse name: Not on file   Number of children: Not on file   Years of education: Not on file   Highest education level: Not on file  Occupational History   Not on file  Tobacco Use   Smoking status: Never   Smokeless tobacco: Never  Substance and Sexual Activity   Alcohol use: Yes    Comment: once every 2-3 months or less   Drug use: No   Sexual activity: Not on file    Comment: married, works in emergency services  Other Topics  Concern   Not on file  Social History Narrative   Not on file   Social Determinants of Health   Financial Resource Strain: Not on file  Food Insecurity: Not on file  Transportation Needs: Not on file  Physical Activity: Not on file  Stress: Not on file  Social Connections: Not on file  Intimate Partner Violence: Not on file   Family History  Problem Relation Age of Onset   Hyperlipidemia Father    Heart disease Father    Cancer Paternal Aunt        breast ca   Colon cancer Neg Hx   Father passed away from pulmonary fibrosis  Review of Systems  All other systems reviewed and are negative.     Objective:   Physical Exam Vitals reviewed.  Constitutional:      General: He is not in acute distress.    Appearance: He is well-developed. He is not diaphoretic.  HENT:     Head: Normocephalic and atraumatic.     Right Ear: External ear normal.     Left Ear: External ear normal.     Nose: Nose normal.     Mouth/Throat:     Pharynx: No oropharyngeal exudate.  Eyes:     General: No scleral icterus.       Right eye: No discharge.        Left eye: No discharge.     Conjunctiva/sclera: Conjunctivae normal.     Pupils: Pupils are equal, round, and reactive to light.  Neck:     Thyroid: No thyromegaly.     Vascular: No JVD.     Trachea: No tracheal deviation.  Cardiovascular:     Rate and Rhythm: Normal rate and regular rhythm.     Heart sounds: Normal heart sounds. No murmur heard.   No friction rub. No gallop.  Pulmonary:     Effort: Pulmonary effort is normal. No respiratory distress.  Breath sounds: Normal breath sounds. No stridor. No wheezing or rales.  Chest:     Chest wall: No tenderness.  Abdominal:     General: Bowel sounds are normal. There is no distension.     Palpations: Abdomen is soft. There is no mass.     Tenderness: There is no abdominal tenderness. There is no guarding or rebound.     Hernia: There is no hernia in the left inguinal area.   Musculoskeletal:        General: No tenderness. Normal range of motion.     Cervical back: Normal range of motion and neck supple.  Lymphadenopathy:     Cervical: No cervical adenopathy.  Skin:    General: Skin is warm.     Coloration: Skin is not pale.     Findings: No erythema or rash.  Neurological:     Mental Status: He is alert and oriented to person, place, and time.     Cranial Nerves: No cranial nerve deficit.     Motor: No abnormal muscle tone.     Coordination: Coordination normal.     Deep Tendon Reflexes: Reflexes are normal and symmetric.  Psychiatric:        Behavior: Behavior normal.        Thought Content: Thought content normal.        Judgment: Judgment normal.          Assessment & Plan:  General medical exam  Pure hypercholesterolemia  Coronary artery calcification seen on CAT scan Patient does mention occasional sharp left-sided chest pain that occurs while he is sitting.  He denies any chest pain with activity and he is very physically fit and active.  He denies any dyspnea on exertion.  Therefore there is no indication to do a stress test at the present time.  However if the chest pain changes in intensity or duration, I would recommend a stress test to evaluate further.  We discussed his cholesterol.  He does not want to increase Crestor at the present time but he does want to increase the fish oil to 2000 mg daily and see if this will help drop that.  Colonoscopy is up-to-date.  PSA is normal.  The remainder of his lab work is excellent.  Recommended a flu shot, shingles vaccine, and a COVID shot.

## 2021-07-01 ENCOUNTER — Other Ambulatory Visit: Payer: Self-pay | Admitting: Family Medicine

## 2021-07-07 ENCOUNTER — Other Ambulatory Visit: Payer: Self-pay

## 2021-07-07 ENCOUNTER — Ambulatory Visit: Payer: 59 | Admitting: Family Medicine

## 2021-07-07 ENCOUNTER — Encounter: Payer: Self-pay | Admitting: Family Medicine

## 2021-07-07 VITALS — BP 138/70 | HR 88 | Temp 98.7°F | Resp 16 | Ht 70.0 in | Wt 215.0 lb

## 2021-07-07 DIAGNOSIS — B9689 Other specified bacterial agents as the cause of diseases classified elsewhere: Secondary | ICD-10-CM | POA: Diagnosis not present

## 2021-07-07 DIAGNOSIS — J019 Acute sinusitis, unspecified: Secondary | ICD-10-CM

## 2021-07-07 MED ORDER — FLUTICASONE PROPIONATE 50 MCG/ACT NA SUSP: 2.0000 | Freq: Every day | NASAL | 6 refills | Status: DC

## 2021-07-07 MED ORDER — AMOXICILLIN-POT CLAVULANATE 875-125 MG PO TABS: 1.0000 | ORAL_TABLET | Freq: Two times a day (BID) | ORAL | 0 refills | Status: DC

## 2021-07-07 NOTE — Progress Notes (Signed)
Subjective:    Patient ID: Keith Forbes, male    DOB: [birth year], 55 y.o.   MRN: [id number]  HPI Was treated earlier this month with Augmentin for a sinus infection.  He states the symptoms are 80% better but they persist.  He claims that there is constant pain and pressure in both frontal sinuses.  He denies any rhinorrhea but he reports head congestion.  He reports postnasal drip and a sore throat on a daily basis and an occasional cough.  He denies any fevers or chills Past Medical History:  Diagnosis Date   Bone lesion 05/05/2015   Cancer ([place])    hodgkin lymphoma   GERD (gastroesophageal reflux disease)    Hernia    Hodgkin lymphoma ([place]) 09/11/2014   Hypercholesteremia    Hyperlipidemia    Lymphoma ([place])    Pars defect of lumbar spine    bilateral 2013 (Dr. Sherwood Gambler)   Wears contact lenses    Past Surgical History:  Procedure Laterality Date   CLOSED REDUCTION HAND FRACTURE     FINGER FRACTURE SURGERY     LYMPH NODE BIOPSY  12/15   PORT-A-CATH REMOVAL N/A 04/27/2015   Procedure: REMOVAL PORT-A-CATH;  Surgeon: Stark Klein, MD;  [facility];  Service: General;  Laterality: N/A;   PORTACATH PLACEMENT Left 09/15/2014   Procedure: INSERTION PORT-A-CATH WITH ULTRA SOUND;  Surgeon: Fanny Skates, MD;  Location: [facility];  Service: General;  Laterality: Left;   Current Outpatient Medications on File Prior to Visit  Medication Sig Dispense Refill   aspirin EC 81 MG tablet Take 81 mg by mouth every other day.     cholecalciferol (VITAMIN D) 1000 units tablet Take 2,000 Units by mouth daily.      Multiple Vitamin (MULTIVITAMIN) tablet Take 1 tablet by mouth daily. occasional     rosuvastatin (CRESTOR) 10 MG tablet TAKE 1 TABLET BY MOUTH EVERY DAY 30 tablet 11   No current facility-administered medications on file prior to visit.   No Known Allergies Social History   Socioeconomic History   Marital status: Married    Spouse name: Not on file   Number  of children: Not on file   Years of education: Not on file   Highest education level: Not on file  Occupational History   Not on file  Tobacco Use   Smoking status: Never   Smokeless tobacco: Never  Substance and Sexual Activity   Alcohol use: Yes    Comment: once every 2-3 months or less   Drug use: No   Sexual activity: Not on file    Comment: married, works in emergency services  Other Topics Concern   Not on file  Social History Narrative   Not on file   Social Determinants of Health   Financial Resource Strain: Not on file  Food Insecurity: Not on file  Transportation Needs: Not on file  Physical Activity: Not on file  Stress: Not on file  Social Connections: Not on file  Intimate Partner Violence: Not on file      Review of Systems  All other systems reviewed and are negative.     Objective:   Physical Exam Vitals reviewed.  Constitutional:      General: He is not in acute distress.    Appearance: He is well-developed. He is not diaphoretic.  HENT:     Head: Normocephalic and atraumatic.     Right Ear: Tympanic membrane, ear canal and external ear normal.  Left Ear: Tympanic membrane, ear canal and external ear normal.     Nose: Congestion present. No mucosal edema or rhinorrhea.     Right Turbinates: Enlarged.     Left Turbinates: Enlarged.     Right Sinus: Frontal sinus tenderness present. No maxillary sinus tenderness.     Left Sinus: Frontal sinus tenderness present. No maxillary sinus tenderness.     Mouth/Throat:     Pharynx: No oropharyngeal exudate.  Eyes:     General:        Right eye: No discharge.        Left eye: No discharge.  Cardiovascular:     Rate and Rhythm: Normal rate and regular rhythm.     Heart sounds: Normal heart sounds. No murmur heard. Pulmonary:     Effort: Pulmonary effort is normal. No respiratory distress.     Breath sounds: Normal breath sounds. No stridor. No wheezing or rales.  Musculoskeletal:     Cervical  back: Normal range of motion.          Assessment & Plan:  Acute bacterial rhinosinusitis  Patient has a sinus infection.  Begin Augmentin 875 mg p.o. twice daily for 10 days.  Add Flonase 2 sprays each nostril daily.  Use a Nettie pot to help flush his sinuses and then reassess in 2 weeks if no better or sooner if worse

## 2021-12-26 ENCOUNTER — Ambulatory Visit (INDEPENDENT_AMBULATORY_CARE_PROVIDER_SITE_OTHER): Payer: 59

## 2021-12-26 ENCOUNTER — Other Ambulatory Visit: Payer: Self-pay

## 2021-12-26 ENCOUNTER — Ambulatory Visit: Payer: 59 | Admitting: Podiatry

## 2021-12-26 DIAGNOSIS — M7741 Metatarsalgia, right foot: Secondary | ICD-10-CM

## 2021-12-26 MED ORDER — MELOXICAM 15 MG PO TABS: 15.0000 mg | ORAL_TABLET | Freq: Every day | ORAL | 1 refills | Status: DC

## 2021-12-26 MED ORDER — METHYLPREDNISOLONE 4 MG PO TBPK: ORAL_TABLET | ORAL | 0 refills | Status: DC

## 2021-12-26 NOTE — Progress Notes (Signed)
? ?  HPI: 56 y.o. male presenting today as a new patient for evaluation of right forefoot pain extending up into the leg this been going on for few months now.  He denies a history of injury.  He has been icing his foot.  He says that it is very painful with activity.  The pain has somewhat improved but he continues to have some residual pain and he presents for further treatment and evaluation ? ?Past Medical History:  ?Diagnosis Date  ? Bone lesion 05/05/2015  ? Cancer [facility])   ? hodgkin lymphoma  ? GERD (gastroesophageal reflux disease)   ? Hernia   ? Hodgkin lymphoma ([place]) 09/11/2014  ? Hypercholesteremia   ? Hyperlipidemia   ? Lymphoma ([place])   ? 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 Sherwood Gambler)  ? Wears contact lenses   ? ? ?Past Surgical History:  ?Procedure Laterality Date  ? CLOSED REDUCTION HAND FRACTURE    ? FINGER FRACTURE SURGERY    ? LYMPH NODE BIOPSY  12/15  ? PORT-A-CATH REMOVAL N/A 04/27/2015  ? Procedure: REMOVAL PORT-A-CATH;  Surgeon: Stark Klein, MD;  [facility];  Service: General;  Laterality: N/A;  ? PORTACATH PLACEMENT Left 09/15/2014  ? Procedure: INSERTION PORT-A-CATH WITH ULTRA SOUND;  Surgeon: Fanny Skates, MD;  Location: [facility];  Service: General;  Laterality: Left;  ? ? ?No Known Allergies ?  ?Physical Exam: ?General: The patient is alert and oriented x3 in no acute distress. ? ?Dermatology: Skin is warm, dry and supple bilateral lower extremities. Negative for open lesions or macerations. ? ?Vascular: Palpable pedal pulses bilaterally. Capillary refill within normal limits.  Negative for any significant edema or erythema ? ?Neurological: Light touch and protective threshold grossly intact ? ?Musculoskeletal Exam: No pedal deformities noted.  There is some diffuse pain on palpation throughout the forefoot.  There is no pinpoint tenderness anywhere throughout the foot ? ?Radiographic Exam:  ?Normal osseous mineralization. Joint spaces preserved. No  fracture/dislocation/boney destruction.   ? ?Assessment: ?1.  Metatarsalgia right foot ?2. H/o pars defect lumbar spine ? ? ?Plan of Care:  ?1. Patient evaluated. X-Rays reviewed.  ?2.  Prescription for Medrol Dosepak ?3.  Prescription for meloxicam 15 mg daily after completion of the Dosepak ?4.  OTC power step insoles dispensed.  Wear daily with good supportive sneakers ?5.  Return to clinic in 4 weeks, if the patient is not significantly better we may consider other more aggressive treatment options including cam boot immobilization, steroid injection ? ?*Runs the [employer] for [employer] ? ?  ?  ?Edrick Kins, DPM ?[place] ? ?Dr. Edrick Kins, DPM  ?  ?[address]                                        ?[place], [place] [address]                ?Office [phone]  ?Fax [phone] ? ? ? ? ?

## 2022-01-30 ENCOUNTER — Ambulatory Visit: Payer: 59 | Admitting: Podiatry

## 2022-01-30 ENCOUNTER — Encounter: Payer: Self-pay | Admitting: Podiatry

## 2022-01-30 DIAGNOSIS — M7751 Other enthesopathy of right foot: Secondary | ICD-10-CM | POA: Diagnosis not present

## 2022-01-30 MED ORDER — BETAMETHASONE SOD PHOS & ACET 6 (3-3) MG/ML IJ SUSP
3.0000 mg | Freq: Once | INTRAMUSCULAR | Status: AC
  Administered 2022-01-30: 3 mg via INTRA_ARTICULAR

## 2022-01-30 NOTE — Progress Notes (Signed)
? ?  HPI: 56 y.o. male presenting today for follow-up evaluation of right forefoot pain.  Patient states that he is doing better.  He says the prednisone pack helped significantly.  Overall improvement.  He presents for further treatment and evaluation ? ?Past Medical History:  ?Diagnosis Date  ? Bone lesion 05/05/2015  ? Cancer [facility])   ? hodgkin lymphoma  ? GERD (gastroesophageal reflux disease)   ? Hernia   ? Hodgkin lymphoma ([place]) 09/11/2014  ? Hypercholesteremia   ? Hyperlipidemia   ? Lymphoma ([place])   ? 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 Sherwood Gambler)  ? Wears contact lenses   ? ? ?Past Surgical History:  ?Procedure Laterality Date  ? CLOSED REDUCTION HAND FRACTURE    ? FINGER FRACTURE SURGERY    ? LYMPH NODE BIOPSY  12/15  ? PORT-A-CATH REMOVAL N/A 04/27/2015  ? Procedure: REMOVAL PORT-A-CATH;  Surgeon: Stark Klein, MD;  Location: [facility];  Service: General;  Laterality: N/A;  ? PORTACATH PLACEMENT Left 09/15/2014  ? Procedure: INSERTION PORT-A-CATH WITH ULTRA SOUND;  Surgeon: Fanny Skates, MD;  Location: [facility];  Service: General;  Laterality: Left;  ? ? ?No Known Allergies ?  ?Physical Exam: ?General: The patient is alert and oriented x3 in no acute distress. ? ?Dermatology: Skin is warm, dry and supple bilateral lower extremities. Negative for open lesions or macerations. ? ?Vascular: Palpable pedal pulses bilaterally. Capillary refill within normal limits.  Negative for any significant edema or erythema ? ?Neurological: Light touch and protective threshold grossly intact ? ?Musculoskeletal Exam: No pedal deformities noted.  Today it seems that the majority the patient's pain is focal around the second MTP ? ?Assessment: ?1.  Metatarsalgia right foot ?2. H/o pars defect lumbar spine ?3.  Second MTP capsulitis right ?4. 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 ? ?Plan of Care:  ?1. Patient evaluated.  ?2.  Continue meloxicam 15 mg daily as needed ?3.  Patient states  that the power step arch supports do help temporarily however he does develop some tailor's bunion pain to the area if he wears them for a prolonged period of time.  Continue PRN ?4.  Injection of 0.5 cc Celestone Soluspan injected around the second MTP right foot. ?5.  Return to clinic as needed ? ?*Runs the [employer] ? ?  ?  ?Edrick Kins, DPM ?[place] ? ?Dr. Edrick Kins, DPM  ?  ?[address]                                        ?[place], [place] [address]                ?Office [phone]  ?Fax [phone] ? ? ? ? ?

## 2022-02-24 ENCOUNTER — Other Ambulatory Visit: Payer: Self-pay | Admitting: Podiatry

## 2022-06-19 ENCOUNTER — Other Ambulatory Visit: Payer: 59

## 2022-06-19 DIAGNOSIS — D709 Neutropenia, unspecified: Secondary | ICD-10-CM

## 2022-06-19 DIAGNOSIS — D701 Agranulocytosis secondary to cancer chemotherapy: Secondary | ICD-10-CM

## 2022-06-19 DIAGNOSIS — D696 Thrombocytopenia, unspecified: Secondary | ICD-10-CM

## 2022-06-19 DIAGNOSIS — R748 Abnormal levels of other serum enzymes: Secondary | ICD-10-CM

## 2022-06-19 DIAGNOSIS — Z125 Encounter for screening for malignant neoplasm of prostate: Secondary | ICD-10-CM

## 2022-06-19 DIAGNOSIS — E7800 Pure hypercholesterolemia, unspecified: Secondary | ICD-10-CM

## 2022-06-19 DIAGNOSIS — R634 Abnormal weight loss: Secondary | ICD-10-CM

## 2022-06-19 DIAGNOSIS — T451X5A Adverse effect of antineoplastic and immunosuppressive drugs, initial encounter: Secondary | ICD-10-CM

## 2022-06-20 ENCOUNTER — Ambulatory Visit: Payer: 59 | Admitting: Podiatry

## 2022-06-20 LAB — TSH: TSH: 0.33 mIU/L — ABNORMAL LOW (ref 0.40–4.50)

## 2022-06-20 LAB — LIPID PANEL
Cholesterol: 179 mg/dL (ref <200)
HDL: 53 mg/dL (ref >=40)
LDL Cholesterol (Calc): 107 mg/dL (calc) — ABNORMAL HIGH
Non-HDL Cholesterol (Calc): 126 mg/dL (calc) (ref <130)
Total CHOL/HDL Ratio: 3.4 (calc) (ref <5.0)
Triglycerides: 97 mg/dL (ref <150)

## 2022-06-20 LAB — CBC WITH DIFFERENTIAL/PLATELET
Absolute Monocytes: 504 cells/uL (ref 200–950)
Basophils Absolute: 78 cells/uL (ref 0–200)
Basophils Relative: 1.1 %
Eosinophils Absolute: 241 cells/uL (ref 15–500)
Eosinophils Relative: 3.4 %
HCT: 47.7 % (ref 38.5–50.0)
Hemoglobin: 16.3 g/dL (ref 13.2–17.1)
Lymphs Abs: 2087 cells/uL (ref 850–3900)
MCH: 31.5 pg (ref 27.0–33.0)
MCHC: 34.2 g/dL (ref 32.0–36.0)
MCV: 92.3 fL (ref 80.0–100.0)
MPV: 12.3 fL (ref 7.5–12.5)
Monocytes Relative: 7.1 %
Neutro Abs: 4189 cells/uL (ref 1500–7800)
Neutrophils Relative %: 59 %
Platelets: 155 10*3/uL (ref 140–400)
RBC: 5.17 10*6/uL (ref 4.20–5.80)
RDW: 11.5 % (ref 11.0–15.0)
Total Lymphocyte: 29.4 %
WBC: 7.1 10*3/uL (ref 3.8–10.8)

## 2022-06-20 LAB — COMPREHENSIVE METABOLIC PANEL
AG Ratio: 1.8 (calc) (ref 1.0–2.5)
ALT: 22 U/L (ref 9–46)
AST: 24 U/L (ref 10–35)
Albumin: 4.4 g/dL (ref 3.6–5.1)
Alkaline phosphatase (APISO): 58 U/L (ref 35–144)
BUN: 16 mg/dL (ref 7–25)
CO2: 25 mmol/L (ref 20–32)
Calcium: 9.6 mg/dL (ref 8.6–10.3)
Chloride: 104 mmol/L (ref 98–110)
Creat: 1.19 mg/dL (ref 0.70–1.30)
Globulin: 2.5 g/dL (calc) (ref 1.9–3.7)
Glucose, Bld: 95 mg/dL (ref 65–99)
Potassium: 4.9 mmol/L (ref 3.5–5.3)
Sodium: 141 mmol/L (ref 135–146)
Total Bilirubin: 1.1 mg/dL (ref 0.2–1.2)
Total Protein: 6.9 g/dL (ref 6.1–8.1)

## 2022-06-20 LAB — PSA: PSA: 0.15 ng/mL (ref <=4.00)

## 2022-06-23 ENCOUNTER — Encounter: Payer: 59 | Admitting: Family Medicine

## 2022-07-03 ENCOUNTER — Ambulatory Visit (INDEPENDENT_AMBULATORY_CARE_PROVIDER_SITE_OTHER): Payer: 59 | Admitting: Family Medicine

## 2022-07-03 VITALS — BP 118/72 | HR 66 | Temp 97.8°F | Ht 70.0 in | Wt 211.4 lb

## 2022-07-03 DIAGNOSIS — I2510 Atherosclerotic heart disease of native coronary artery without angina pectoris: Secondary | ICD-10-CM

## 2022-07-03 DIAGNOSIS — E7800 Pure hypercholesterolemia, unspecified: Secondary | ICD-10-CM

## 2022-07-03 DIAGNOSIS — Z0000 Encounter for general adult medical examination without abnormal findings: Secondary | ICD-10-CM | POA: Diagnosis not present

## 2022-07-03 NOTE — Progress Notes (Signed)
Subjective:    Patient ID: Keith Forbes, male    DOB: 1965-11-18, 56 y.o.   MRN: [id number]  HPI  Patient is a 56 year old Caucasian male here today for complete physical exam.  Past medical history is significant for history of Hodgkin's disease in 2015.  He completed 6 months of ABVD therapy and his Hodgkin's disease remains in remission. Treatment was complicated by a DVT in his neck and upper left arm near his Port-A-Cath site. This was treated with 3 months of xarelto and immediate removal of the Port-A-Cath by surgery.  Patient has a history of mild atherosclerotic calcifications seen on a screening CT of the chest in 2018.  Patient is not due for repeat colonoscopy until 2028 as his last colonoscopy in 2018 was normal.  For about 10 months, he has been having pain in his right leg.  The pain will occasionally radiate from his knee over the lateral aspect of the dorsum of his right foot.  He has seen a podiatrist and "nothing has been found to be wrong".  He does have a history of an MRI of the L5 nerve root impingement due to bilateral pars defects.  This was seen on MRI in 2014.  I suspect that he could be dealing with L5 radiculopathy.  However the pain has gradually gotten better and he has decided not to pursue it further at this time.  Otherwise he is doing well Immunization History  Administered Date(s) Administered   Influenza Split 07/02/2013   Influenza-Unspecified 07/02/2014, 07/03/2015, 06/29/2016   Moderna Sars-Covid-2 Vaccination 10/03/2019   PFIZER(Purple Top)SARS-COV-2 Vaccination 11/17/2019, 12/15/2019   Pneumococcal Polysaccharide-23 08/25/2015   Tdap 08/03/2008, 05/09/2018   Lab on 06/19/2022  Component Date Value Ref Range Status   WBC 06/19/2022 7.1  3.8 - 10.8 Thousand/uL Final   RBC 06/19/2022 5.17  4.20 - 5.80 Million/uL Final   Hemoglobin 06/19/2022 16.3  13.2 - 17.1 g/dL Final   HCT 06/19/2022 47.7  38.5 - 50.0 % Final   MCV 06/19/2022 92.3  80.0 - 100.0 fL  Final   MCH 06/19/2022 31.5  27.0 - 33.0 pg Final   MCHC 06/19/2022 34.2  32.0 - 36.0 g/dL Final   RDW 06/19/2022 11.5  11.0 - 15.0 % Final   Platelets 06/19/2022 155  140 - 400 Thousand/uL Final   MPV 06/19/2022 12.3  7.5 - 12.5 fL Final   Neutro Abs 06/19/2022 4,189  1,500 - 7,800 cells/uL Final   Lymphs Abs 06/19/2022 2,087  850 - 3,900 cells/uL Final   Absolute Monocytes 06/19/2022 504  200 - 950 cells/uL Final   Eosinophils Absolute 06/19/2022 241  15 - 500 cells/uL Final   Basophils Absolute 06/19/2022 78  0 - 200 cells/uL Final   Neutrophils Relative % 06/19/2022 59  % Final   Total Lymphocyte 06/19/2022 29.4  % Final   Monocytes Relative 06/19/2022 7.1  % Final   Eosinophils Relative 06/19/2022 3.4  % Final   Basophils Relative 06/19/2022 1.1  % Final   Glucose, Bld 06/19/2022 95  65 - 99 mg/dL Final   Comment: .            Fasting reference interval .    BUN 06/19/2022 16  7 - 25 mg/dL Final   Creat 06/19/2022 1.19  0.70 - 1.30 mg/dL Final   BUN/Creatinine Ratio 06/19/2022 SEE NOTE:  6 - 22 (calc) Final   Comment:    Not Reported: BUN and Creatinine are within  reference range. .    Sodium 06/19/2022 141  135 - 146 mmol/L Final   Potassium 06/19/2022 4.9  3.5 - 5.3 mmol/L Final   Chloride 06/19/2022 104  98 - 110 mmol/L Final   CO2 06/19/2022 25  20 - 32 mmol/L Final   Calcium 06/19/2022 9.6  8.6 - 10.3 mg/dL Final   Total Protein 06/19/2022 6.9  6.1 - 8.1 g/dL Final   Albumin 06/19/2022 4.4  3.6 - 5.1 g/dL Final   Globulin 06/19/2022 2.5  1.9 - 3.7 g/dL (calc) Final   AG Ratio 06/19/2022 1.8  1.0 - 2.5 (calc) Final   Total Bilirubin 06/19/2022 1.1  0.2 - 1.2 mg/dL Final   Alkaline phosphatase (APISO) 06/19/2022 58  35 - 144 U/L Final   AST 06/19/2022 24  10 - 35 U/L Final   ALT 06/19/2022 22  9 - 46 U/L Final   Cholesterol 06/19/2022 179  <200 mg/dL Final   HDL 06/19/2022 53  > OR = 40 mg/dL Final   Triglycerides 06/19/2022 97  <150 mg/dL Final   LDL  Cholesterol (Calc) 06/19/2022 107 (H)  mg/dL (calc) Final   Comment: Reference range: <100 . Desirable range <100 mg/dL for primary prevention;   <70 mg/dL for patients with CHD or diabetic patients  with > or = 2 CHD risk factors. Marland Kitchen LDL-C is now calculated using the Martin-Hopkins  calculation, which is a validated novel method providing  better accuracy than the Friedewald equation in the  estimation of LDL-C.  Cresenciano Genre et al. Annamaria Helling. 4270;623(76): 2061-2068  (http://education.[facility].com/faq/FAQ164)    Total CHOL/HDL Ratio 06/19/2022 3.4  <5.0 (calc) Final   Non-HDL Cholesterol (Calc) 06/19/2022 126  <130 mg/dL (calc) Final   Comment: For patients with diabetes plus 1 major ASCVD risk  factor, treating to a non-HDL-C goal of <100 mg/dL  (LDL-C of <70 mg/dL) is considered a therapeutic  option.    PSA 06/19/2022 0.15  < OR = 4.00 ng/mL Final   Comment: The total PSA value from this assay system is  standardized against the WHO standard. The test  result will be approximately 20% lower when compared  to the equimolar-standardized total PSA (Beckman  Coulter). Comparison of serial PSA results should be  interpreted with this fact in mind. . This test was performed using the Siemens  chemiluminescent method. Values obtained from  different assay methods cannot be used interchangeably. PSA levels, regardless of value, should not be interpreted as absolute evidence of the presence or absence of disease.    TSH 06/19/2022 0.33 (L)  0.40 - 4.50 mIU/L Final   Past Medical History:  Diagnosis Date   Bone lesion 05/05/2015   Cancer ([place])    hodgkin lymphoma   GERD (gastroesophageal reflux disease)    Hernia    Hodgkin lymphoma ([place]) 09/11/2014   Hypercholesteremia    Hyperlipidemia    Lymphoma ([place])    Pars defect of lumbar spine    bilateral 2013 (Dr. Sherwood Gambler)   Wears contact lenses    Past Surgical History:  Procedure Laterality Date   CLOSED REDUCTION HAND  FRACTURE     FINGER FRACTURE SURGERY     LYMPH NODE BIOPSY  12/15   PORT-A-CATH REMOVAL N/A 04/27/2015   Procedure: REMOVAL PORT-A-CATH;  Surgeon: Stark Klein, MD;  Location: [facility];  Service: General;  Laterality: N/A;   PORTACATH PLACEMENT Left 09/15/2014   Procedure: INSERTION PORT-A-CATH WITH ULTRA SOUND;  Surgeon: Fanny Skates, MD;  Location: [facility];  Service: General;  Laterality: Left;   Current Outpatient Medications on File Prior to Visit  Medication Sig Dispense Refill   aspirin EC 81 MG tablet Take 81 mg by mouth every other day.     cholecalciferol (VITAMIN D) 1000 units tablet Take 2,000 Units by mouth daily.      fluticasone (FLONASE) 50 MCG/ACT nasal spray Place 2 sprays into both nostrils daily. 16 g 6   meloxicam (MOBIC) 15 MG tablet TAKE 1 TABLET (15 MG TOTAL) BY MOUTH DAILY. 30 tablet 1   Multiple Vitamin (MULTIVITAMIN) tablet Take 1 tablet by mouth daily. occasional     rosuvastatin (CRESTOR) 10 MG tablet TAKE 1 TABLET BY MOUTH EVERY DAY 30 tablet 11   No current facility-administered medications on file prior to visit.     No Known Allergies Social History   Socioeconomic History   Marital status: Married    Spouse name: Not on file   Number of children: Not on file   Years of education: Not on file   Highest education level: Not on file  Occupational History   Not on file  Tobacco Use   Smoking status: Never   Smokeless tobacco: Never  Substance and Sexual Activity   Alcohol use: Yes    Comment: once every 2-3 months or less   Drug use: No   Sexual activity: Not on file    Comment: married, works in emergency services  Other Topics Concern   Not on file  Social History Narrative   Not on file   Social Determinants of Health   Financial Resource Strain: Not on file  Food Insecurity: Not on file  Transportation Needs: Not on file  Physical Activity: Not on file  Stress: Not on file  Social Connections: Not on file   Intimate Partner Violence: Not on file   Family History  Problem Relation Age of Onset   Hyperlipidemia Father    Heart disease Father    Cancer Paternal Aunt        breast ca   Colon cancer Neg Hx   Father passed away from pulmonary fibrosis  Review of Systems  All other systems reviewed and are negative.      Objective:   Physical Exam Vitals reviewed.  Constitutional:      General: He is not in acute distress.    Appearance: He is well-developed. He is not diaphoretic.  HENT:     Head: Normocephalic and atraumatic.     Right Ear: External ear normal.     Left Ear: External ear normal.     Nose: Nose normal.     Mouth/Throat:     Pharynx: No oropharyngeal exudate.  Eyes:     General: No scleral icterus.       Right eye: No discharge.        Left eye: No discharge.     Conjunctiva/sclera: Conjunctivae normal.     Pupils: Pupils are equal, round, and reactive to light.  Neck:     Thyroid: No thyromegaly.     Vascular: No JVD.     Trachea: No tracheal deviation.  Cardiovascular:     Rate and Rhythm: Normal rate and regular rhythm.     Heart sounds: Normal heart sounds. No murmur heard.    No friction rub. No gallop.  Pulmonary:     Effort: Pulmonary effort is normal. No respiratory distress.     Breath sounds: Normal breath sounds. No stridor. No wheezing or  rales.  Chest:     Chest wall: No tenderness.  Abdominal:     General: Bowel sounds are normal. There is no distension.     Palpations: Abdomen is soft. There is no mass.     Tenderness: There is no abdominal tenderness. There is no guarding or rebound.     Hernia: There is no hernia in the left inguinal area.  Musculoskeletal:        General: No tenderness. Normal range of motion.     Cervical back: Normal range of motion and neck supple.  Lymphadenopathy:     Cervical: No cervical adenopathy.  Skin:    General: Skin is warm.     Coloration: Skin is not pale.     Findings: No erythema or rash.   Neurological:     Mental Status: He is alert and oriented to person, place, and time.     Cranial Nerves: No cranial nerve deficit.     Motor: No abnormal muscle tone.     Coordination: Coordination normal.     Deep Tendon Reflexes: Reflexes are normal and symmetric.  Psychiatric:        Behavior: Behavior normal.        Thought Content: Thought content normal.        Judgment: Judgment normal.           Assessment & Plan:  General medical exam  Pure hypercholesterolemia  Coronary artery calcification seen on CAT scan Patient's physical exam today is normal.  We discussed increasing his Lexapro medication but at the present time the patient is comfortable taking 10 mg of Crestor.  We will not pursue the L5 radiculopathy at this point unless symptoms return.  Colonoscopy and prostate cancer screening are up-to-date.  Patient has already received his flu shot at work.  He politely declines COVID booster.  The remainder of his preventative care is up-to-date

## 2022-08-03 ENCOUNTER — Other Ambulatory Visit: Payer: Self-pay | Admitting: Family Medicine

## 2022-08-03 NOTE — Telephone Encounter (Signed)
Requested Prescriptions  Pending Prescriptions Disp Refills   rosuvastatin (CRESTOR) 10 MG tablet [Pharmacy Med Name: ROSUVASTATIN CALCIUM 10 MG TAB] 30 tablet 11    Sig: TAKE 1 TABLET BY MOUTH EVERY DAY     Cardiovascular:  Antilipid - Statins 2 Failed - 08/03/2022  2:36 AM      Failed - Valid encounter within last 12 months    Recent Outpatient Visits           1 year ago Acute bacterial rhinosinusitis   [place] Pickard, Cammie Mcgee, MD   1 year ago General medical exam   [place] [name], MD   1 year ago Acute bacterial rhinosinusitis   [place] [facility] Schaumann, Cammie Mcgee, MD   2 years ago General medical exam   [place] [name], MD   3 years ago Actinic keratosis   [place] Pickard, Cammie Mcgee, MD       Future Appointments             In 28 months Pickard, Cammie Mcgee, MD [place]            Failed - Lipid Panel in normal range within the last 12 months    Cholesterol  Date Value Ref Range Status  06/19/2022 179 <200 mg/dL Final   LDL Cholesterol (Calc)  Date Value Ref Range Status  06/19/2022 107 (H) mg/dL (calc) Final    Comment:    Reference range: <100 . Desirable range <100 mg/dL for primary prevention;   <70 mg/dL for patients with CHD or diabetic patients  with > or = 2 CHD risk factors. Marland Kitchen LDL-C is now calculated using the Martin-Hopkins  calculation, which is a validated novel method providing  better accuracy than the Friedewald equation in the  estimation of LDL-C.  Cresenciano Genre et al. Annamaria Helling. 1027;253(66): 2061-2068  (http://education.[facility].com/faq/FAQ164)    HDL  Date Value Ref Range Status  06/19/2022 53 > OR = 40 mg/dL Final   Triglycerides  Date Value Ref Range Status  06/19/2022 97 <150 mg/dL Final         Passed - Cr in normal range and within 360 days    Creatinine  Date Value Ref Range Status   05/10/2017 1.2 0.7 - 1.3 mg/dL Final   Creat  Date Value Ref Range Status  06/19/2022 1.19 0.70 - 1.30 mg/dL Final         Passed - Patient is not pregnant

## 2023-06-29 ENCOUNTER — Other Ambulatory Visit: Payer: 59

## 2023-06-29 DIAGNOSIS — R748 Abnormal levels of other serum enzymes: Secondary | ICD-10-CM

## 2023-06-29 DIAGNOSIS — Z125 Encounter for screening for malignant neoplasm of prostate: Secondary | ICD-10-CM

## 2023-06-29 DIAGNOSIS — D696 Thrombocytopenia, unspecified: Secondary | ICD-10-CM

## 2023-06-29 DIAGNOSIS — E7800 Pure hypercholesterolemia, unspecified: Secondary | ICD-10-CM

## 2023-06-29 DIAGNOSIS — R5383 Other fatigue: Secondary | ICD-10-CM

## 2023-06-29 DIAGNOSIS — D709 Neutropenia, unspecified: Secondary | ICD-10-CM

## 2023-06-29 DIAGNOSIS — I2510 Atherosclerotic heart disease of native coronary artery without angina pectoris: Secondary | ICD-10-CM

## 2023-06-30 LAB — CBC WITH DIFFERENTIAL/PLATELET
Absolute Monocytes: 611 {cells}/uL (ref 200–950)
Basophils Absolute: 52 {cells}/uL (ref 0–200)
Basophils Relative: 0.8 %
Eosinophils Absolute: 202 {cells}/uL (ref 15–500)
Eosinophils Relative: 3.1 %
HCT: 50.8 % — ABNORMAL HIGH (ref 38.5–50.0)
Hemoglobin: 16.3 g/dL (ref 13.2–17.1)
Lymphs Abs: 1963 {cells}/uL (ref 850–3900)
MCH: 31 pg (ref 27.0–33.0)
MCHC: 32.1 g/dL (ref 32.0–36.0)
MCV: 96.8 fL (ref 80.0–100.0)
MPV: 12.1 fL (ref 7.5–12.5)
Monocytes Relative: 9.4 %
Neutro Abs: 3673 {cells}/uL (ref 1500–7800)
Neutrophils Relative %: 56.5 %
Platelets: 156 10*3/uL (ref 140–400)
RBC: 5.25 10*6/uL (ref 4.20–5.80)
RDW: 11.7 % (ref 11.0–15.0)
Total Lymphocyte: 30.2 %
WBC: 6.5 10*3/uL (ref 3.8–10.8)

## 2023-06-30 LAB — COMPLETE METABOLIC PANEL WITH GFR
AG Ratio: 1.6 (calc) (ref 1.0–2.5)
ALT: 30 U/L (ref 9–46)
AST: 39 U/L — ABNORMAL HIGH (ref 10–35)
Albumin: 4.5 g/dL (ref 3.6–5.1)
Alkaline phosphatase (APISO): 56 U/L (ref 35–144)
BUN: 17 mg/dL (ref 7–25)
CO2: 28 mmol/L (ref 20–32)
Calcium: 10 mg/dL (ref 8.6–10.3)
Chloride: 103 mmol/L (ref 98–110)
Creat: 1.24 mg/dL (ref 0.70–1.30)
Globulin: 2.8 g/dL (ref 1.9–3.7)
Glucose, Bld: 91 mg/dL (ref 65–99)
Potassium: 4.9 mmol/L (ref 3.5–5.3)
Sodium: 138 mmol/L (ref 135–146)
Total Bilirubin: 1.8 mg/dL — ABNORMAL HIGH (ref 0.2–1.2)
Total Protein: 7.3 g/dL (ref 6.1–8.1)
eGFR: 68 mL/min/{1.73_m2} (ref >=60)

## 2023-06-30 LAB — LIPID PANEL
Cholesterol: 197 mg/dL (ref <200)
HDL: 57 mg/dL (ref >=40)
LDL Cholesterol (Calc): 120 mg/dL — ABNORMAL HIGH
Non-HDL Cholesterol (Calc): 140 mg/dL — ABNORMAL HIGH (ref <130)
Total CHOL/HDL Ratio: 3.5 (calc) (ref <5.0)
Triglycerides: 92 mg/dL (ref <150)

## 2023-06-30 LAB — THYROID PANEL WITH TSH
Free Thyroxine Index: 2.2 (ref 1.4–3.8)
T3 Uptake: 32 % (ref 22–35)
T4, Total: 7 ug/dL (ref 4.9–10.5)
TSH: 0.35 m[IU]/L — ABNORMAL LOW (ref 0.40–4.50)

## 2023-06-30 LAB — PSA: PSA: 0.17 ng/mL (ref <=4.00)

## 2023-07-05 ENCOUNTER — Ambulatory Visit: Payer: 59 | Admitting: Family Medicine

## 2023-07-05 VITALS — BP 122/72 | HR 67 | Temp 97.8°F | Ht 70.0 in | Wt 211.8 lb

## 2023-07-05 DIAGNOSIS — R7989 Other specified abnormal findings of blood chemistry: Secondary | ICD-10-CM | POA: Diagnosis not present

## 2023-07-05 DIAGNOSIS — I2510 Atherosclerotic heart disease of native coronary artery without angina pectoris: Secondary | ICD-10-CM

## 2023-07-05 DIAGNOSIS — E7800 Pure hypercholesterolemia, unspecified: Secondary | ICD-10-CM

## 2023-07-05 DIAGNOSIS — Z0001 Encounter for general adult medical examination with abnormal findings: Secondary | ICD-10-CM

## 2023-07-05 DIAGNOSIS — Z0000 Encounter for general adult medical examination without abnormal findings: Secondary | ICD-10-CM

## 2023-07-05 NOTE — Progress Notes (Signed)
Subjective:    Patient ID: Keith Forbes, male    DOB: 24-Oct-1965, 57 y.o.   MRN: [id number]  HPI  Patient is a 57 year old Caucasian male here today for complete physical exam.  Past medical history is significant for history of Hodgkin's disease in 2015.  He completed 6 months of ABVD therapy and his Hodgkin's disease remains in remission. Treatment was complicated by a DVT in his neck and upper left arm near his Port-A-Cath site. This was treated with 3 months of xarelto and immediate removal of the Port-A-Cath by surgery.  Patient has a history of mild atherosclerotic calcifications seen on a screening CT of the chest in 2018.  Patient is not due for repeat colonoscopy until 2028 as his last colonoscopy in 2018 was normal.  Patient states that he is exercising more.  He started running now.  However his liver function test is just slightly elevated along with a bilirubin level.  His LDL cholesterol is also slightly elevated.  He is taking Crestor due to the calcification seen on CT scan.  He does get occasional muscle aches from the statin which he takes co-Q10 for Immunization History  Administered Date(s) Administered   Influenza Split 07/02/2013   Influenza-Unspecified 07/02/2014, 07/03/2015, 06/29/2016   Moderna Sars-Covid-2 Vaccination 10/03/2019   PFIZER(Purple Top)SARS-COV-2 Vaccination 11/17/2019, 12/15/2019   Pneumococcal Polysaccharide-23 08/25/2015   Tdap 08/03/2008, 05/09/2018   Lab on 06/29/2023  Component Date Value Ref Range Status   WBC 06/29/2023 6.5  3.8 - 10.8 Thousand/uL Final   RBC 06/29/2023 5.25  4.20 - 5.80 Million/uL Final   Hemoglobin 06/29/2023 16.3  13.2 - 17.1 g/dL Final   HCT 28/41/3244 50.8 (H)  38.5 - 50.0 % Final   MCV 06/29/2023 96.8  80.0 - 100.0 fL Final   MCH 06/29/2023 31.0  27.0 - 33.0 pg Final   MCHC 06/29/2023 32.1  32.0 - 36.0 g/dL Final   RDW 10/04/7251 11.7  11.0 - 15.0 % Final   Platelets 06/29/2023 156  140 - 400 Thousand/uL Final   MPV  06/29/2023 12.1  7.5 - 12.5 fL Final   Neutro Abs 06/29/2023 3,673  1,500 - 7,800 cells/uL Final   Lymphs Abs 06/29/2023 1,963  850 - 3,900 cells/uL Final   Absolute Monocytes 06/29/2023 611  200 - 950 cells/uL Final   Eosinophils Absolute 06/29/2023 202  15 - 500 cells/uL Final   Basophils Absolute 06/29/2023 52  0 - 200 cells/uL Final   Neutrophils Relative % 06/29/2023 56.5  % Final   Total Lymphocyte 06/29/2023 30.2  % Final   Monocytes Relative 06/29/2023 9.4  % Final   Eosinophils Relative 06/29/2023 3.1  % Final   Basophils Relative 06/29/2023 0.8  % Final   Glucose, Bld 06/29/2023 91  65 - 99 mg/dL Final   Comment: .            Fasting reference interval .    BUN 06/29/2023 17  7 - 25 mg/dL Final   Creat 66/44/0347 1.24  0.70 - 1.30 mg/dL Final   eGFR 42/59/5638 68  > OR = 60 mL/min/1.64m2 Final   BUN/Creatinine Ratio 06/29/2023 SEE NOTE:  6 - 22 (calc) Final   Comment:    Not Reported: BUN and Creatinine are within    reference range. .    Sodium 06/29/2023 138  135 - 146 mmol/L Final   Potassium 06/29/2023 4.9  3.5 - 5.3 mmol/L Final   Chloride 06/29/2023 103  98 - 110 mmol/L  Final   CO2 06/29/2023 28  20 - 32 mmol/L Final   Calcium 06/29/2023 10.0  8.6 - 10.3 mg/dL Final   Total Protein 16/07/9603 7.3  6.1 - 8.1 g/dL Final   Albumin 54/06/8118 4.5  3.6 - 5.1 g/dL Final   Globulin 14/78/2956 2.8  1.9 - 3.7 g/dL (calc) Final   AG Ratio 06/29/2023 1.6  1.0 - 2.5 (calc) Final   Total Bilirubin 06/29/2023 1.8 (H)  0.2 - 1.2 mg/dL Final   Alkaline phosphatase (APISO) 06/29/2023 56  35 - 144 U/L Final   AST 06/29/2023 39 (H)  10 - 35 U/L Final   ALT 06/29/2023 30  9 - 46 U/L Final   Cholesterol 06/29/2023 197  <200 mg/dL Final   HDL 21/30/8657 57  > OR = 40 mg/dL Final   Triglycerides 84/69/6295 92  <150 mg/dL Final   LDL Cholesterol (Calc) 06/29/2023 120 (H)  mg/dL (calc) Final   Comment: Reference range: <100 . Desirable range <100 mg/dL for primary prevention;   <70  mg/dL for patients with CHD or diabetic patients  with > or = 2 CHD risk factors. Marland Kitchen LDL-C is now calculated using the Martin-Hopkins  calculation, which is a validated novel method providing  better accuracy than the Friedewald equation in the  estimation of LDL-C.  Horald Pollen et al. Lenox Ahr. 2841;324(40): 2061-2068  (http://education.[employer].com/faq/FAQ164)    Total CHOL/HDL Ratio 06/29/2023 3.5  <1.0 (calc) Final   Non-HDL Cholesterol (Calc) 06/29/2023 140 (H)  <130 mg/dL (calc) Final   Comment: For patients with diabetes plus 1 major ASCVD risk  factor, treating to a non-HDL-C goal of <100 mg/dL  (LDL-C of <27 mg/dL) is considered a therapeutic  option.    T3 Uptake 06/29/2023 32  22 - 35 % Final   T4, Total 06/29/2023 7.0  4.9 - 10.5 mcg/dL Final   Free Thyroxine Index 06/29/2023 2.2  1.4 - 3.8 Final   TSH 06/29/2023 0.35 (L)  0.40 - 4.50 mIU/L Final   PSA 06/29/2023 0.17  < OR = 4.00 ng/mL Final   Comment: The total PSA value from this assay system is  standardized against the WHO standard. The test  result will be approximately 20% lower when compared  to the equimolar-standardized total PSA (Beckman  Coulter). Comparison of serial PSA results should be  interpreted with this fact in mind. . This test was performed using the Siemens  chemiluminescent method. Values obtained from  different assay methods cannot be used interchangeably. PSA levels, regardless of value, should not be interpreted as absolute evidence of the presence or absence of disease.    Past Medical History:  Diagnosis Date   Bone lesion 05/05/2015   Cancer (HCC)    hodgkin lymphoma   GERD (gastroesophageal reflux disease)    Hernia    Hodgkin lymphoma (HCC) 09/11/2014   Hypercholesteremia    Hyperlipidemia    Lymphoma (HCC)    Pars defect of lumbar spine    bilateral 2013 (Dr. Newell Coral)   Wears contact lenses    Past Surgical History:  Procedure Laterality Date   CLOSED REDUCTION HAND  FRACTURE     FINGER FRACTURE SURGERY     LYMPH NODE BIOPSY  12/15   PORT-A-CATH REMOVAL N/A 04/27/2015   Procedure: REMOVAL PORT-A-CATH;  Surgeon: Almond Lint, MD;  Location: [facility];  Service: General;  Laterality: N/A;   PORTACATH PLACEMENT Left 09/15/2014   Procedure: INSERTION PORT-A-CATH WITH ULTRA SOUND;  Surgeon: Claud Kelp, MD;  Location:  [facility];  Service: General;  Laterality: Left;   Current Outpatient Medications on File Prior to Visit  Medication Sig Dispense Refill   aspirin EC 81 MG tablet Take 81 mg by mouth every other day.     cholecalciferol (VITAMIN D) 1000 units tablet Take 2,000 Units by mouth daily.      fluticasone (FLONASE) 50 MCG/ACT nasal spray Place 2 sprays into both nostrils daily. (Patient not taking: Reported on 07/03/2022) 16 g 6   meloxicam (MOBIC) 15 MG tablet TAKE 1 TABLET (15 MG TOTAL) BY MOUTH DAILY. (Patient not taking: Reported on 07/03/2022) 30 tablet 1   Multiple Vitamin (MULTIVITAMIN) tablet Take 1 tablet by mouth daily. occasional     rosuvastatin (CRESTOR) 10 MG tablet TAKE 1 TABLET BY MOUTH EVERY DAY 30 tablet 11   No current facility-administered medications on file prior to visit.     No Known Allergies Social History   Socioeconomic History   Marital status: Married    Spouse name: Not on file   Number of children: Not on file   Years of education: Not on file   Highest education level: Not on file  Occupational History   Not on file  Tobacco Use   Smoking status: Never   Smokeless tobacco: Never  Substance and Sexual Activity   Alcohol use: Yes    Comment: once every 2-3 months or less   Drug use: No   Sexual activity: Not on file    Comment: married, works in emergency services  Other Topics Concern   Not on file  Social History Narrative   Not on file   Social Determinants of Health   Financial Resource Strain: Not on file  Food Insecurity: Not on file  Transportation Needs: Not on file  Physical  Activity: Not on file  Stress: Not on file  Social Connections: Unknown (02/14/2022)   Received from [facility]   Social Network    Social Network: Not on file  Intimate Partner Violence: Unknown (01/06/2022)   [facility] from [facility]   HITS    Physically Hurt: Not on file    Insult or Talk Down To: Not on file    Threaten Physical Harm: Not on file    Scream or Curse: Not on file   Family History  Problem Relation Age of Onset   Hyperlipidemia Father    Heart disease Father    Cancer Paternal Aunt        breast ca   Colon cancer Neg Hx   Father passed away from pulmonary fibrosis  Review of Systems  All other systems reviewed and are negative.      Objective:   Physical Exam Vitals reviewed.  Constitutional:      General: He is not in acute distress.    Appearance: He is well-developed. He is not diaphoretic.  HENT:     Head: Normocephalic and atraumatic.     Right Ear: External ear normal.     Left Ear: External ear normal.     Nose: Nose normal.     Mouth/Throat:     Pharynx: No oropharyngeal exudate.  Eyes:     General: No scleral icterus.       Right eye: No discharge.        Left eye: No discharge.     Conjunctiva/sclera: Conjunctivae normal.     Pupils: Pupils are equal, round, and reactive to light.  Neck:  Thyroid: No thyromegaly.     Vascular: No JVD.     Trachea: No tracheal deviation.  Cardiovascular:     Rate and Rhythm: Normal rate and regular rhythm.     Heart sounds: Normal heart sounds. No murmur heard.    No friction rub. No gallop.  Pulmonary:     Effort: Pulmonary effort is normal. No respiratory distress.     Breath sounds: Normal breath sounds. No stridor. No wheezing or rales.  Chest:     Chest wall: No tenderness.  Abdominal:     General: Bowel sounds are normal. There is no distension.     Palpations: Abdomen is soft. There is no mass.     Tenderness: There is no abdominal tenderness. There is  no guarding or rebound.     Hernia: There is no hernia in the left inguinal area.  Musculoskeletal:        General: No tenderness. Normal range of motion.     Cervical back: Normal range of motion and neck supple.  Lymphadenopathy:     Cervical: No cervical adenopathy.  Skin:    General: Skin is warm.     Coloration: Skin is not pale.     Findings: No erythema or rash.  Neurological:     Mental Status: He is alert and oriented to person, place, and time.     Cranial Nerves: No cranial nerve deficit.     Motor: No abnormal muscle tone.     Coordination: Coordination normal.     Deep Tendon Reflexes: Reflexes are normal and symmetric.  Psychiatric:        Behavior: Behavior normal.        Thought Content: Thought content normal.        Judgment: Judgment normal.           Assessment & Plan:  General medical exam  Pure hypercholesterolemia  Coronary artery calcification seen on CAT scan - Plan: CT CARDIAC SCORING (SELF PAY ONLY)  Elevated LFTs - Plan: US Abdomen Limited RUQ (LIVER/GB) I suspect that the mild elevation in liver function test are due to the statin.  However given his history of lymphoma I will obtain an ultrasound of the liver.  I will also get a coronary artery CT scan.  If there is significant plaque greater than 70th percentile, I would want to increase his statin to drop his LDL cholesterol below 70.  I also recommended adding fish oil 2000 mg a day.  Prostate cancer test and colonoscopy are up-to-date.  Patient had his flu shot outside of the office.  He declines an covid shot.

## 2023-07-10 ENCOUNTER — Other Ambulatory Visit: Payer: 59

## 2023-07-11 ENCOUNTER — Ambulatory Visit
Admission: RE | Admit: 2023-07-11 | Discharge: 2023-07-11 | Disposition: A | Discharge status: Home / Self Care | Payer: 59 | Source: Ambulatory Visit | Attending: Family Medicine | Admitting: Family Medicine

## 2023-07-11 DIAGNOSIS — R7989 Other specified abnormal findings of blood chemistry: Secondary | ICD-10-CM

## 2023-07-20 ENCOUNTER — Ambulatory Visit
Admission: RE | Admit: 2023-07-20 | Discharge: 2023-07-20 | Disposition: A | Discharge status: Home / Self Care | Payer: No Typology Code available for payment source | Source: Ambulatory Visit | Attending: Family Medicine | Admitting: Family Medicine

## 2023-07-20 ENCOUNTER — Telehealth: Payer: Self-pay

## 2023-07-20 DIAGNOSIS — I2510 Atherosclerotic heart disease of native coronary artery without angina pectoris: Secondary | ICD-10-CM

## 2023-07-20 NOTE — Telephone Encounter (Signed)
Pt's wife, Rulon Eisenmenger, called to discuss CT results. Results given and verbalized understanding. Cari asks if you think it is Alp's cholesterol medication that raised his liver enzymes and should he continue? Thank you.

## 2023-07-20 NOTE — Telephone Encounter (Signed)
Pt's wife called wanting to discuss results of pt's ct scan of liver that was done almost 2 weeks ago. Please advise.  Cb#: [phone]

## 2023-07-23 ENCOUNTER — Other Ambulatory Visit: Payer: Self-pay

## 2023-07-23 MED ORDER — ROSUVASTATIN CALCIUM 20 MG PO TABS: 20.0000 mg | ORAL_TABLET | Freq: Every day | ORAL | 3 refills | Status: DC

## 2023-09-03 ENCOUNTER — Other Ambulatory Visit: Payer: Self-pay | Admitting: Family Medicine

## 2023-09-03 ENCOUNTER — Telehealth: Payer: Self-pay

## 2023-09-03 ENCOUNTER — Other Ambulatory Visit: Payer: Self-pay

## 2023-09-03 DIAGNOSIS — E7800 Pure hypercholesterolemia, unspecified: Secondary | ICD-10-CM

## 2023-09-03 MED ORDER — ROSUVASTATIN CALCIUM 20 MG PO TABS: 20.0000 mg | ORAL_TABLET | Freq: Every day | ORAL | 3 refills | Status: DC

## 2023-09-03 NOTE — Telephone Encounter (Signed)
Copied from CRM [id number]. Topic: Clinical - Medication Refill >> Sep 03, 2023  1:28 PM Hector Shade B wrote: Most Recent Primary Care Visit:  Provider: Lynnea Ferrier T  Department: [facility]  Visit Type: PHYSICAL  Date: 07/05/2023  Medication:  rosuvastatin (CRESTOR) 20 MG tablet   Has the patient contacted their pharmacy? Yes (Agent: If no, request that the patient contact the pharmacy for the refill. If patient does not wish to contact the pharmacy document the reason why and proceed with request.) (Agent: If yes, when and what did the pharmacy advise?)  Is this the correct pharmacy for this prescription? Yes If no, delete pharmacy and type the correct one.  This is the patient's preferred pharmacy:  [id number] [place], [place] - [address] [place] [address] Phone: [phone] Fax: [phone]  [facility] [place], [place] - [address] [place] [address] Phone: [phone] Fax: [phone]  [facility] [place], [place] - [address] [place] [address] Phone: [phone] Fax: [phone]   Has the prescription been filled recently? Yes  Is the patient out of the medication? Yes  Has the patient been seen for an appointment in the last year OR does the patient have an upcoming appointment? Yes  Can we respond through MyChart? No  Agent: Please be advised that Rx refills may take up to 3 business days. We ask that you follow-up with your pharmacy.

## 2023-12-29 ENCOUNTER — Emergency Department (HOSPITAL_BASED_OUTPATIENT_CLINIC_OR_DEPARTMENT_OTHER)
Admission: EM | Admit: 2023-12-29 | Discharge: 2023-12-29 | Disposition: A | Discharge status: Home / Self Care | Attending: Emergency Medicine | Admitting: Emergency Medicine

## 2023-12-29 ENCOUNTER — Other Ambulatory Visit: Payer: Self-pay

## 2023-12-29 ENCOUNTER — Emergency Department (HOSPITAL_BASED_OUTPATIENT_CLINIC_OR_DEPARTMENT_OTHER): Admitting: Radiology

## 2023-12-29 DIAGNOSIS — S6991XA Unspecified injury of right wrist, hand and finger(s), initial encounter: Secondary | ICD-10-CM | POA: Diagnosis present

## 2023-12-29 DIAGNOSIS — Z7982 Long term (current) use of aspirin: Secondary | ICD-10-CM | POA: Diagnosis not present

## 2023-12-29 DIAGNOSIS — W228XXA Striking against or struck by other objects, initial encounter: Secondary | ICD-10-CM | POA: Diagnosis not present

## 2023-12-29 DIAGNOSIS — S6701XA Crushing injury of right thumb, initial encounter: Secondary | ICD-10-CM | POA: Diagnosis not present

## 2023-12-29 DIAGNOSIS — S61011A Laceration without foreign body of right thumb without damage to nail, initial encounter: Secondary | ICD-10-CM | POA: Diagnosis not present

## 2023-12-29 MED ORDER — LIDOCAINE HCL (PF) 1 % IJ SOLN
5.0000 mL | Freq: Once | INTRAMUSCULAR | Status: AC
  Administered 2023-12-29: 5 mL
  Filled 2023-12-29: qty 5

## 2023-12-29 MED ORDER — LIDOCAINE-EPINEPHRINE-TETRACAINE (LET) TOPICAL GEL
3.0000 mL | Freq: Once | TOPICAL | Status: AC
  Administered 2023-12-29: 3 mL via TOPICAL
  Filled 2023-12-29: qty 3

## 2023-12-29 NOTE — ED Provider Notes (Signed)
 [place] EMERGENCY DEPARTMENT AT [facility] Provider Note   CSN: [id number] Arrival date & time: 12/29/23  1335     History  laceration   Keith Forbes is a 58 y.o. male here for evaluation of right thumb injury.  Hit the dorsal aspect of right thumb at PIP.  He is approximately 5 cm laceration.  His tetanus is up-to-date.  No numbness or weakness.  Full range of motion to hand.  HPI     Home Medications Prior to Admission medications   Medication Sig Start Date End Date Taking? Authorizing Provider  aspirin EC 81 MG tablet Take 81 mg by mouth every other day.    [provider]  cholecalciferol (VITAMIN D) 1000 units tablet Take 2,000 Units by mouth daily.     [provider]  co-enzyme Q-10 30 MG capsule Take 30 mg by mouth daily.    [provider]  Multiple Vitamin (MULTIVITAMIN) tablet Take 1 tablet by mouth daily. occasional    [provider]  rosuvastatin (CRESTOR) 20 MG tablet Take 1 tablet (20 mg total) by mouth daily. 09/03/23   Donita Brooks, MD      Allergies    Patient has no known allergies.    Review of Systems   Review of Systems  Constitutional: Negative.   HENT: Negative.    Respiratory: Negative.    Cardiovascular: Negative.   Gastrointestinal: Negative.   Genitourinary: Negative.   Musculoskeletal:        Right thumb injury  Skin:  Positive for wound.  Neurological: Negative.   All other systems reviewed and are negative.   Physical Exam Updated Vital Signs BP 123/65   Pulse (!) 54   Temp 98 F (36.7 C) (Oral)   Resp 16   Wt 90.7 kg   SpO2 100%   BMI 28.70 kg/m  Physical Exam Vitals and nursing note reviewed.  Constitutional:      General: He is not in acute distress.    Appearance: He is well-developed. He is not ill-appearing or diaphoretic.  HENT:     Head: Atraumatic.  Eyes:     Pupils: Pupils are equal, round, and reactive to light.  Cardiovascular:     Rate and Rhythm:  Normal rate and regular rhythm.  Pulmonary:     Effort: Pulmonary effort is normal. No respiratory distress.  Abdominal:     General: There is no distension.     Palpations: Abdomen is soft.  Musculoskeletal:        General: Normal range of motion.       Hands:     Cervical back: Normal range of motion and neck supple.     Comments: No bony tenderness, full range of motion.  1.5 cm jagged laceration over PIP at right thumb.  Skin:    General: Skin is warm and dry.     Capillary Refill: Capillary refill takes less than 2 seconds.     Findings: Laceration present.     Comments: 1.5 cm laceration dorsum right PIP of thumb.  No visualized bone, tendon  Neurological:     General: No focal deficit present.     Mental Status: He is alert and oriented to person, place, and time.     ED Results / Procedures / Treatments   Labs (all labs ordered are listed, but only abnormal results are displayed) Labs Reviewed - No data to display  EKG None  Radiology DG Finger Thumb Right Result Date:  12/29/2023 CLINICAL DATA:  Right thumb injury.  Hit with a sledge hammer. EXAM: RIGHT THUMB 2+V COMPARISON:  None Available. FINDINGS: There is no evidence of fracture or dislocation. There is no evidence of arthropathy or other focal bone abnormality. Soft tissues are unremarkable. IMPRESSION: Negative. Electronically Signed   By: Obie Dredge M.D.   On: 12/29/2023 14:24    Procedures .Laceration Repair  Date/Time: 12/29/2023 3:23 PM  Performed by: Ralph Leyden A, PA-C Authorized by: Linwood Dibbles, PA-C   Consent:    Consent obtained:  Verbal   Consent given by:  Patient   Risks, benefits, and alternatives were discussed: yes     Risks discussed:  Infection, pain, retained foreign body, poor cosmetic result, poor wound healing, nerve damage, tendon damage, vascular damage and need for additional repair   Alternatives discussed:  No treatment, delayed treatment, observation and  referral Universal protocol:    Procedure explained and questions answered to patient or proxy's satisfaction: yes     Relevant documents present and verified: yes     Test results available: yes     Imaging studies available: yes     Required blood products, implants, devices, and special equipment available: yes     Site/side marked: yes     Immediately prior to procedure, a time out was called: yes     Patient identity confirmed:  Verbally with patient Anesthesia:    Anesthesia method:  Local infiltration   Local anesthetic:  Lidocaine 1% w/o epi Laceration details:    Location:  Finger   Length (cm):  1.5   Depth (mm):  3 Pre-procedure details:    Preparation:  Patient was prepped and draped in usual sterile fashion and imaging obtained to evaluate for foreign bodies Exploration:    Hemostasis achieved with:  Direct pressure   Imaging obtained: x-ray     Imaging outcome: foreign body not noted     Wound extent: fascia not violated, no foreign body, no signs of injury, no tendon damage, no underlying fracture and no vascular damage     Contaminated: no   Treatment:    Area cleansed with:  Povidone-iodine   Amount of cleaning:  Extensive   Irrigation solution:  Sterile saline Skin repair:    Repair method:  Sutures   Suture size:  5-0   Suture material:  Prolene   Suture technique:  Simple interrupted   Number of sutures:  5 Approximation:    Approximation:  Close Repair type:    Repair type:  Intermediate Post-procedure details:    Dressing:  Bulky dressing   Procedure completion:  Tolerated well, no immediate complications     Medications Ordered in ED Medications  lidocaine (PF) (XYLOCAINE) 1 % injection 5 mL (5 mLs Infiltration Given 12/29/23 1448)  lidocaine-EPINEPHrine-tetracaine (LET) topical gel (3 mLs Topical Given 12/29/23 1448)   ED Course/ Medical Decision Making/ A&P   58 year old here for evaluation of laceration to dorsum of right thumb at PIP just  prior to arrival after hitting thumb with a hammer.  He has no active bleeding.  His tetanus is up-to-date.  He is neurovascularly intact.  Imaging personally viewed and interpreted:  X-ray right hand shows no fracture, dislocation  Discussed results with patient.  Will plan on irrigating, suturing wound.  See procedure note.  Patient tolerated well.  He #5 sutures placed.  The skin in the laceration is very thin.  I did discuss the possibility of tissue necrosis given blood supply  in this area.  Will have him follow-up with hand surgery.  He was given a static finger splint to prevent bending of the injured digit.  He was given work note.  Tylenol Motrin, ice.  Low suspicion for open fracture, vascular injury, infectious process, dislocation, tendon or ligament injury  The patient has been appropriately medically screened and/or stabilized in the ED. I have low suspicion for any other emergent medical condition which would require further screening, evaluation or treatment in the ED or require inpatient management.  Patient is hemodynamically stable and in no acute distress.  Patient able to ambulate in department prior to ED.  Evaluation does not show acute pathology that would require ongoing or additional emergent interventions while in the emergency department or further inpatient treatment.  I have discussed the diagnosis with the patient and answered all questions.  Pain is been managed while in the emergency department and patient has no further complaints prior to discharge.  Patient is comfortable with plan discussed in room and is stable for discharge at this time.  I have discussed strict return precautions for returning to the emergency department.  Patient was encouraged to follow-up with PCP/specialist refer to at discharge.                                   Medical Decision Making Amount and/or Complexity of Data Reviewed External Data Reviewed: labs, radiology and  notes. Radiology: ordered and independent interpretation performed. Decision-making details documented in ED Course.  Risk OTC drugs. Prescription drug management. Decision regarding hospitalization. Diagnosis or treatment significantly limited by social determinants of health.         Final Clinical Impression(s) / ED Diagnoses Final diagnoses:  Crushing injury of right thumb, initial encounter  Laceration of skin of right thumb, initial encounter    Rx / DC Orders ED Discharge Orders     None         Thanos Cousineau A, PA-C 12/29/23 1527    Lorre Nick, MD 12/30/23 [phone]

## 2023-12-29 NOTE — Discharge Instructions (Signed)
 You have #5 sutures in your right thumb.  These will need to be removed in about 7 to 10 days.  I have also placed the number to a hand surgeon on your discharge paperwork.  Call to schedule a follow-up appointment  As we discussed in the room make sure to keep your thumb straight and try not to bend it.  Given the skin is thin in this area there is always a possibility of the skin dying and falling off.  Do not get wet for 24 hours after that may let warm soapy water run over the wound  Follow-up outpatient, return for new or worsening symptoms.  Please ice, Tylenol Motrin as needed for pain

## 2023-12-29 NOTE — ED Triage Notes (Signed)
 Struck right thumb with hammer. Open skin. Tetanus 2010. No other injuries.

## 2024-07-04 ENCOUNTER — Other Ambulatory Visit: Payer: 59

## 2024-07-04 DIAGNOSIS — R7989 Other specified abnormal findings of blood chemistry: Secondary | ICD-10-CM

## 2024-07-04 DIAGNOSIS — E7800 Pure hypercholesterolemia, unspecified: Secondary | ICD-10-CM

## 2024-07-04 DIAGNOSIS — Z0000 Encounter for general adult medical examination without abnormal findings: Secondary | ICD-10-CM

## 2024-07-04 DIAGNOSIS — I2510 Atherosclerotic heart disease of native coronary artery without angina pectoris: Secondary | ICD-10-CM

## 2024-07-04 DIAGNOSIS — D696 Thrombocytopenia, unspecified: Secondary | ICD-10-CM

## 2024-07-04 DIAGNOSIS — Z125 Encounter for screening for malignant neoplasm of prostate: Secondary | ICD-10-CM

## 2024-07-05 LAB — COMPLETE METABOLIC PANEL WITHOUT GFR
AG Ratio: 1.9 (calc) (ref 1.0–2.5)
ALT: 22 U/L (ref 9–46)
AST: 25 U/L (ref 10–35)
Albumin: 4.6 g/dL (ref 3.6–5.1)
Alkaline phosphatase (APISO): 52 U/L (ref 35–144)
BUN: 18 mg/dL (ref 7–25)
CO2: 31 mmol/L (ref 20–32)
Calcium: 9.8 mg/dL (ref 8.6–10.3)
Chloride: 103 mmol/L (ref 98–110)
Creat: 1.27 mg/dL (ref 0.70–1.30)
Globulin: 2.4 g/dL (ref 1.9–3.7)
Glucose, Bld: 89 mg/dL (ref 65–99)
Potassium: 5.2 mmol/L (ref 3.5–5.3)
Sodium: 139 mmol/L (ref 135–146)
Total Bilirubin: 1.6 mg/dL — ABNORMAL HIGH (ref 0.2–1.2)
Total Protein: 7 g/dL (ref 6.1–8.1)

## 2024-07-05 LAB — CBC WITH DIFFERENTIAL/PLATELET
Absolute Lymphocytes: 2040 {cells}/uL (ref 850–3900)
Absolute Monocytes: 626 {cells}/uL (ref 200–950)
Basophils Absolute: 82 {cells}/uL (ref 0–200)
Basophils Relative: 1.2 %
Eosinophils Absolute: 190 {cells}/uL (ref 15–500)
Eosinophils Relative: 2.8 %
HCT: 47.6 % (ref 38.5–50.0)
Hemoglobin: 16 g/dL (ref 13.2–17.1)
MCH: 31.5 pg (ref 27.0–33.0)
MCHC: 33.6 g/dL (ref 32.0–36.0)
MCV: 93.7 fL (ref 80.0–100.0)
MPV: 11.6 fL (ref 7.5–12.5)
Monocytes Relative: 9.2 %
Neutro Abs: 3862 {cells}/uL (ref 1500–7800)
Neutrophils Relative %: 56.8 %
Platelets: 159 Thousand/uL (ref 140–400)
RBC: 5.08 Million/uL (ref 4.20–5.80)
RDW: 11.9 % (ref 11.0–15.0)
Total Lymphocyte: 30 %
WBC: 6.8 Thousand/uL (ref 3.8–10.8)

## 2024-07-05 LAB — LIPID PANEL
Cholesterol: 166 mg/dL (ref <200)
HDL: 59 mg/dL (ref >=40)
LDL Cholesterol (Calc): 91 mg/dL
Non-HDL Cholesterol (Calc): 107 mg/dL (ref <130)
Total CHOL/HDL Ratio: 2.8 (calc) (ref <5.0)
Triglycerides: 75 mg/dL (ref <150)

## 2024-07-05 LAB — PSA: PSA: 0.19 ng/mL (ref <=4.00)

## 2024-07-05 LAB — TSH: TSH: 0.46 m[IU]/L (ref 0.40–4.50)

## 2024-07-07 ENCOUNTER — Ambulatory Visit: Payer: Self-pay | Admitting: Family Medicine

## 2024-07-07 ENCOUNTER — Encounter: Payer: Self-pay | Admitting: Family Medicine

## 2024-07-07 ENCOUNTER — Ambulatory Visit: Payer: 59 | Admitting: Family Medicine

## 2024-07-07 VITALS — BP 114/62 | HR 57 | Temp 98.0°F | Ht 70.0 in | Wt 209.0 lb

## 2024-07-07 DIAGNOSIS — I2510 Atherosclerotic heart disease of native coronary artery without angina pectoris: Secondary | ICD-10-CM

## 2024-07-07 DIAGNOSIS — E7800 Pure hypercholesterolemia, unspecified: Secondary | ICD-10-CM | POA: Diagnosis not present

## 2024-07-07 DIAGNOSIS — Z0000 Encounter for general adult medical examination without abnormal findings: Secondary | ICD-10-CM

## 2024-07-07 DIAGNOSIS — Z0001 Encounter for general adult medical examination with abnormal findings: Secondary | ICD-10-CM | POA: Diagnosis not present

## 2024-07-07 MED ORDER — EZETIMIBE 10 MG PO TABS: 10.0000 mg | ORAL_TABLET | Freq: Every day | ORAL | 3 refills | Status: AC

## 2024-07-07 NOTE — Progress Notes (Signed)
 Subjective:    Patient ID: Keith Forbes, male    DOB: 1966-02-04, 58 y.o.   MRN: [id number]  HPI  Patient is a 58 year old Caucasian male here today for complete physical exam.  Past medical history is significant for history of Hodgkin's disease in 2015.  He completed 6 months of ABVD therapy and his Hodgkin's disease remains in remission. Treatment was complicated by a DVT in his neck and upper left arm near his Port-A-Cath site. This was treated with 3 months of xarelto  and immediate removal of the Port-A-Cath by surgery.  Patient had a coronary artery calcium  score in October.  This was elevated.  He was in the 92nd percentile.  However each lesion was less than 400.  He is currently on Crestor  20 mg daily.  His goal is to keep his LDL cholesterol less than 70 if possible.  His most recent lab work was listed below Lab on 07/04/2024  Component Date Value Ref Range Status   WBC 07/04/2024 6.8  3.8 - 10.8 Thousand/uL Final   RBC 07/04/2024 5.08  4.20 - 5.80 Million/uL Final   Hemoglobin 07/04/2024 16.0  13.2 - 17.1 g/dL Final   HCT 89/96/7974 47.6  38.5 - 50.0 % Final   MCV 07/04/2024 93.7  80.0 - 100.0 fL Final   MCH 07/04/2024 31.5  27.0 - 33.0 pg Final   MCHC 07/04/2024 33.6  32.0 - 36.0 g/dL Final   Comment: For adults, a slight decrease in the calculated MCHC value (in the range of 30 to 32 g/dL) is most likely not clinically significant; however, it should be interpreted with caution in correlation with other red cell parameters and the patient's clinical condition.    RDW 07/04/2024 11.9  11.0 - 15.0 % Final   Platelets 07/04/2024 159  140 - 400 Thousand/uL Final   MPV 07/04/2024 11.6  7.5 - 12.5 fL Final   Neutro Abs 07/04/2024 3,862  1,500 - 7,800 cells/uL Final   Absolute Lymphocytes 07/04/2024 2,040  850 - 3,900 cells/uL Final   Absolute Monocytes 07/04/2024 626  200 - 950 cells/uL Final   Eosinophils Absolute 07/04/2024 190  15 - 500 cells/uL Final   Basophils Absolute  07/04/2024 82  0 - 200 cells/uL Final   Neutrophils Relative % 07/04/2024 56.8  % Final   Total Lymphocyte 07/04/2024 30.0  % Final   Monocytes Relative 07/04/2024 9.2  % Final   Eosinophils Relative 07/04/2024 2.8  % Final   Basophils Relative 07/04/2024 1.2  % Final   Glucose, Bld 07/04/2024 89  65 - 99 mg/dL Final   Comment: .            Fasting reference interval .    BUN 07/04/2024 18  7 - 25 mg/dL Final   Creat 89/96/7974 1.27  0.70 - 1.30 mg/dL Final   BUN/Creatinine Ratio 07/04/2024 SEE NOTE:  6 - 22 (calc) Final   Comment:    Not Reported: BUN and Creatinine are within    reference range. .    Sodium 07/04/2024 139  135 - 146 mmol/L Final   Potassium 07/04/2024 5.2  3.5 - 5.3 mmol/L Final   Chloride 07/04/2024 103  98 - 110 mmol/L Final   CO2 07/04/2024 31  20 - 32 mmol/L Final   Calcium  07/04/2024 9.8  8.6 - 10.3 mg/dL Final   Total Protein 89/96/7974 7.0  6.1 - 8.1 g/dL Final   Albumin 89/96/7974 4.6  3.6 - 5.1 g/dL Final  Globulin 07/04/2024 2.4  1.9 - 3.7 g/dL (calc) Final   AG Ratio 07/04/2024 1.9  1.0 - 2.5 (calc) Final   Total Bilirubin 07/04/2024 1.6 (H)  0.2 - 1.2 mg/dL Final   Alkaline phosphatase (APISO) 07/04/2024 52  35 - 144 U/L Final   AST 07/04/2024 25  10 - 35 U/L Final   ALT 07/04/2024 22  9 - 46 U/L Final   Cholesterol 07/04/2024 166  <200 mg/dL Final   HDL 89/96/7974 59  > OR = 40 mg/dL Final   Triglycerides 89/96/7974 75  <150 mg/dL Final   LDL Cholesterol (Calc) 07/04/2024 91  mg/dL (calc) Final   Comment: Reference range: <100 . Desirable range <100 mg/dL for primary prevention;   <70 mg/dL for patients with CHD or diabetic patients  with > or = 2 CHD risk factors. SABRA LDL-C is now calculated using the Martin-Hopkins  calculation, which is a validated novel method providing  better accuracy than the Friedewald equation in the  estimation of LDL-C.  Gladis APPLETHWAITE et al. SANDREA. 7986;689(80): 2061-2068   (http://education.[facility].com/faq/FAQ164)    Total CHOL/HDL Ratio 07/04/2024 2.8  <4.9 (calc) Final   Non-HDL Cholesterol (Calc) 07/04/2024 107  <130 mg/dL (calc) Final   Comment: For patients with diabetes plus 1 major ASCVD risk  factor, treating to a non-HDL-C goal of <100 mg/dL  (LDL-C of <29 mg/dL) is considered a therapeutic  option.    TSH 07/04/2024 0.46  0.40 - 4.50 mIU/L Final   PSA 07/04/2024 0.19  < OR = 4.00 ng/mL Final   Comment: The total PSA value from this assay system is  standardized against the WHO standard. The test  result will be approximately 20% lower when compared  to the equimolar-standardized total PSA (Beckman  Coulter). Comparison of serial PSA results should be  interpreted with this fact in mind. . This test was performed using the Siemens  chemiluminescent method. Values obtained from  different assay methods cannot be used interchangeably. PSA levels, regardless of value, should not be interpreted as absolute evidence of the presence or absence of disease.     Immunization History  Administered Date(s) Administered   Influenza Inj Mdck Quad Pf 06/24/2022   Influenza Split 07/02/2013   Influenza-Unspecified 06/29/2016, 06/30/2023, 06/21/2024   Moderna Sars-Covid-2 Vaccination 10/03/2019   PFIZER(Purple Top)SARS-COV-2 Vaccination 11/17/2019, 12/15/2019   Pneumococcal Polysaccharide-23 08/25/2015   Tdap 08/03/2008, 05/09/2018   Lab on 07/04/2024  Component Date Value Ref Range Status   WBC 07/04/2024 6.8  3.8 - 10.8 Thousand/uL Final   RBC 07/04/2024 5.08  4.20 - 5.80 Million/uL Final   Hemoglobin 07/04/2024 16.0  13.2 - 17.1 g/dL Final   HCT 89/96/7974 47.6  38.5 - 50.0 % Final   MCV 07/04/2024 93.7  80.0 - 100.0 fL Final   MCH 07/04/2024 31.5  27.0 - 33.0 pg Final   MCHC 07/04/2024 33.6  32.0 - 36.0 g/dL Final   Comment: For adults, a slight decrease in the calculated MCHC value (in the range of 30 to 32 g/dL) is most  likely not clinically significant; however, it should be interpreted with caution in correlation with other red cell parameters and the patient's clinical condition.    RDW 07/04/2024 11.9  11.0 - 15.0 % Final   Platelets 07/04/2024 159  140 - 400 Thousand/uL Final   MPV 07/04/2024 11.6  7.5 - 12.5 fL Final   Neutro Abs 07/04/2024 3,862  1,500 - 7,800 cells/uL Final   Absolute Lymphocytes 07/04/2024 2,040  850 - 3,900 cells/uL Final   Absolute Monocytes 07/04/2024 626  200 - 950 cells/uL Final   Eosinophils Absolute 07/04/2024 190  15 - 500 cells/uL Final   Basophils Absolute 07/04/2024 82  0 - 200 cells/uL Final   Neutrophils Relative % 07/04/2024 56.8  % Final   Total Lymphocyte 07/04/2024 30.0  % Final   Monocytes Relative 07/04/2024 9.2  % Final   Eosinophils Relative 07/04/2024 2.8  % Final   Basophils Relative 07/04/2024 1.2  % Final   Glucose, Bld 07/04/2024 89  65 - 99 mg/dL Final   Comment: .            Fasting reference interval .    BUN 07/04/2024 18  7 - 25 mg/dL Final   Creat 89/96/7974 1.27  0.70 - 1.30 mg/dL Final   BUN/Creatinine Ratio 07/04/2024 SEE NOTE:  6 - 22 (calc) Final   Comment:    Not Reported: BUN and Creatinine are within    reference range. .    Sodium 07/04/2024 139  135 - 146 mmol/L Final   Potassium 07/04/2024 5.2  3.5 - 5.3 mmol/L Final   Chloride 07/04/2024 103  98 - 110 mmol/L Final   CO2 07/04/2024 31  20 - 32 mmol/L Final   Calcium  07/04/2024 9.8  8.6 - 10.3 mg/dL Final   Total Protein 89/96/7974 7.0  6.1 - 8.1 g/dL Final   Albumin 89/96/7974 4.6  3.6 - 5.1 g/dL Final   Globulin 89/96/7974 2.4  1.9 - 3.7 g/dL (calc) Final   AG Ratio 07/04/2024 1.9  1.0 - 2.5 (calc) Final   Total Bilirubin 07/04/2024 1.6 (H)  0.2 - 1.2 mg/dL Final   Alkaline phosphatase (APISO) 07/04/2024 52  35 - 144 U/L Final   AST 07/04/2024 25  10 - 35 U/L Final   ALT 07/04/2024 22  9 - 46 U/L Final   Cholesterol 07/04/2024 166  <200 mg/dL Final   HDL 89/96/7974 59   > OR = 40 mg/dL Final   Triglycerides 89/96/7974 75  <150 mg/dL Final   LDL Cholesterol (Calc) 07/04/2024 91  mg/dL (calc) Final   Comment: Reference range: <100 . Desirable range <100 mg/dL for primary prevention;   <70 mg/dL for patients with CHD or diabetic patients  with > or = 2 CHD risk factors. SABRA LDL-C is now calculated using the Martin-Hopkins  calculation, which is a validated novel method providing  better accuracy than the Friedewald equation in the  estimation of LDL-C.  Gladis APPLETHWAITE et al. SANDREA. 7986;689(80): 2061-2068  (http://education.[facility].com/faq/FAQ164)    Total CHOL/HDL Ratio 07/04/2024 2.8  <4.9 (calc) Final   Non-HDL Cholesterol (Calc) 07/04/2024 107  <130 mg/dL (calc) Final   Comment: For patients with diabetes plus 1 major ASCVD risk  factor, treating to a non-HDL-C goal of <100 mg/dL  (LDL-C of <29 mg/dL) is considered a therapeutic  option.    TSH 07/04/2024 0.46  0.40 - 4.50 mIU/L Final   PSA 07/04/2024 0.19  < OR = 4.00 ng/mL Final   Comment: The total PSA value from this assay system is  standardized against the WHO standard. The test  result will be approximately 20% lower when compared  to the equimolar-standardized total PSA (Beckman  Coulter). Comparison of serial PSA results should be  interpreted with this fact in mind. . This test was performed using the Siemens  chemiluminescent method. Values obtained from  different assay methods cannot be used interchangeably. PSA levels, regardless of value,  should not be interpreted as absolute evidence of the presence or absence of disease.    Past Medical History:  Diagnosis Date   Bone lesion 05/05/2015   Cancer (HCC)    hodgkin lymphoma   GERD (gastroesophageal reflux disease)    Hernia    Hodgkin lymphoma (HCC) 09/11/2014   Hypercholesteremia    Hyperlipidemia    Lymphoma (HCC)    Pars defect of lumbar spine    bilateral 2013 (Dr. Alix)   Wears contact lenses    Past  Surgical History:  Procedure Laterality Date   CLOSED REDUCTION HAND FRACTURE     FINGER FRACTURE SURGERY     LYMPH NODE BIOPSY  12/15   PORT-A-CATH REMOVAL N/A 04/27/2015   Procedure: REMOVAL PORT-A-CATH;  Surgeon: Jina Nephew, MD;  Location: [facility];  Service: General;  Laterality: N/A;   PORTACATH PLACEMENT Left 09/15/2014   Procedure: INSERTION PORT-A-CATH WITH ULTRA SOUND;  Surgeon: Elon Pacini, MD;  Location: [facility];  Service: General;  Laterality: Left;   Current Outpatient Medications on File Prior to Visit  Medication Sig Dispense Refill   aspirin EC 81 MG tablet Take 81 mg by mouth every other day.     cholecalciferol (VITAMIN D) 1000 units tablet Take 2,000 Units by mouth daily.      co-enzyme Q-10 30 MG capsule Take 30 mg by mouth daily.     Omega-3 Fatty Acids (FISH OIL) 500 MG CAPS Take by mouth.     rosuvastatin  (CRESTOR ) 20 MG tablet Take 1 tablet (20 mg total) by mouth daily. 90 tablet 3   Multiple Vitamin (MULTIVITAMIN) tablet Take 1 tablet by mouth daily. occasional (Patient not taking: Reported on 07/07/2024)     No current facility-administered medications on file prior to visit.     No Known Allergies Social History   Socioeconomic History   Marital status: Married    Spouse name: Not on file   Number of children: Not on file   Years of education: Not on file   Highest education level: Not on file  Occupational History   Not on file  Tobacco Use   Smoking status: Never   Smokeless tobacco: Never  Substance and Sexual Activity   Alcohol use: Yes    Comment: once every 2-3 months or less   Drug use: No   Sexual activity: Not on file    Comment: married, works in emergency services  Other Topics Concern   Not on file  Social History Narrative   Not on file   Social Drivers of Health   Financial Resource Strain: Not on file  Food Insecurity: Not on file  Transportation Needs: Not on file  Physical Activity: Not on file   Stress: Not on file  Social Connections: Unknown (02/14/2022)   [facility]   Social Network    Social Network: Not on file  Intimate Partner Violence: Unknown (01/06/2022)   Received from [facility]   HITS    Physically Hurt: Not on file    Insult or Talk Down To: Not on file    Threaten Physical Harm: Not on file    Scream or Curse: Not on file   Family History  Problem Relation Age of Onset   Hyperlipidemia Father    Heart disease Father    Cancer Paternal Aunt        breast ca   Colon cancer Neg Hx   Father passed away from pulmonary fibrosis  Review  of Systems  All other systems reviewed and are negative.      Objective:   Physical Exam Vitals reviewed.  Constitutional:      General: He is not in acute distress.    Appearance: He is well-developed. He is not diaphoretic.  HENT:     Head: Normocephalic and atraumatic.     Right Ear: External ear normal.     Left Ear: External ear normal.     Nose: Nose normal.     Mouth/Throat:     Pharynx: No oropharyngeal exudate.  Eyes:     General: No scleral icterus.       Right eye: No discharge.        Left eye: No discharge.     Conjunctiva/sclera: Conjunctivae normal.     Pupils: Pupils are equal, round, and reactive to light.  Neck:     Thyroid : No thyromegaly.     Vascular: No JVD.     Trachea: No tracheal deviation.  Cardiovascular:     Rate and Rhythm: Normal rate and regular rhythm.     Heart sounds: Normal heart sounds. No murmur heard.    No friction rub. No gallop.  Pulmonary:     Effort: Pulmonary effort is normal. No respiratory distress.     Breath sounds: Normal breath sounds. No stridor. No wheezing or rales.  Chest:     Chest wall: No tenderness.  Abdominal:     General: Bowel sounds are normal. There is no distension.     Palpations: Abdomen is soft. There is no mass.     Tenderness: There is no abdominal tenderness. There is no guarding or rebound.     Hernia: There is no  hernia in the left inguinal area.  Musculoskeletal:        General: No tenderness. Normal range of motion.     Cervical back: Normal range of motion and neck supple.  Lymphadenopathy:     Cervical: No cervical adenopathy.  Skin:    General: Skin is warm.     Coloration: Skin is not pale.     Findings: No erythema or rash.  Neurological:     Mental Status: He is alert and oriented to person, place, and time.     Cranial Nerves: No cranial nerve deficit.     Motor: No abnormal muscle tone.     Coordination: Coordination normal.     Deep Tendon Reflexes: Reflexes are normal and symmetric.  Psychiatric:        Behavior: Behavior normal.        Thought Content: Thought content normal.        Judgment: Judgment normal.           Assessment & Plan:  General medical exam  Coronary artery calcification seen on CAT scan  Pure hypercholesterolemia Because of his elevated coronary artery calcium  score, we want to keep his LDL cholesterol less than 70.  We will add Zetia 10 mg daily to rosuvastatin  and recheck a fasting lipid panel and liver function test in 3 months.  If not at goal, consider switching Zetia to Repatha if insurance will cover.  Patient is up-to-date on his flu and tetanus shot.  He defers the pneumonia vaccine for now.  The remainder of his lab work is excellent.  Colonoscopy is up-to-date.  PSA is outstanding.  Blood pressure is excellent

## 2024-08-08 ENCOUNTER — Other Ambulatory Visit: Payer: Self-pay

## 2024-08-08 DIAGNOSIS — E7800 Pure hypercholesterolemia, unspecified: Secondary | ICD-10-CM

## 2024-08-08 NOTE — Telephone Encounter (Signed)
 Prescription Request  08/08/2024  LOV: 07/07/24  What is the name of the medication or equipment? rosuvastatin  (CRESTOR ) 20 MG tablet [[id number]]   Have you contacted your pharmacy to request a refill? Yes   Which pharmacy would you like this sent to?  [facility] [place], [place] - [id number] [place] [address] Phone: [phone] Fax: [phone]    Patient notified that their request is being sent to the clinical staff for review and that they should receive a response within 2 business days.   Please advise at [facility] [phone]

## 2024-08-09 MED ORDER — ROSUVASTATIN CALCIUM 20 MG PO TABS: 20.0000 mg | ORAL_TABLET | Freq: Every day | ORAL | 3 refills | Status: AC

## 2024-08-09 NOTE — Telephone Encounter (Signed)
 Requested Prescriptions  Pending Prescriptions Disp Refills   rosuvastatin  (CRESTOR ) 20 MG tablet 90 tablet 3    Sig: Take 1 tablet (20 mg total) by mouth daily.     Cardiovascular:  Antilipid - Statins 2 Failed - 08/09/2024  9:32 AM      Failed - Lipid Panel in normal range within the last 12 months    Cholesterol  Date Value Ref Range Status  07/04/2024 166 <200 mg/dL Final   LDL Cholesterol (Calc)  Date Value Ref Range Status  07/04/2024 91 mg/dL (calc) Final    Comment:    Reference range: <100 . Desirable range <100 mg/dL for primary prevention;   <70 mg/dL for patients with CHD or diabetic patients  with > or = 2 CHD risk factors. SABRA LDL-C is now calculated using the Martin-Hopkins  calculation, which is a validated novel method providing  better accuracy than the Friedewald equation in the  estimation of LDL-C.  Keith Forbes et al. SANDREA. 7986;689(80): 2061-2068  (http://education.[facility].com/faq/FAQ164)    HDL  Date Value Ref Range Status  07/04/2024 59 > OR = 40 mg/dL Final   Triglycerides  Date Value Ref Range Status  07/04/2024 75 <150 mg/dL Final         Passed - Cr in normal range and within 360 days    Creatinine  Date Value Ref Range Status  05/10/2017 1.2 0.7 - 1.3 mg/dL Final   Creat  Date Value Ref Range Status  07/04/2024 1.27 0.70 - 1.30 mg/dL Final         Passed - Patient is not pregnant      Passed - Valid encounter within last 12 months    Recent Outpatient Visits           1 month ago General medical exam   [place] [facility] Duanne Butler DASEN, MD   1 year ago General medical exam   [place] [facility] Duanne Butler DASEN, MD   2 years ago General medical exam   [place] [facility] [place], Butler DASEN, MD

## 2024-08-12 ENCOUNTER — Ambulatory Visit
Admission: RE | Admit: 2024-08-12 | Discharge: 2024-08-12 | Disposition: A | Discharge status: Home / Self Care | Attending: Family Medicine | Admitting: Family Medicine

## 2024-08-12 ENCOUNTER — Other Ambulatory Visit: Payer: Self-pay

## 2024-08-12 VITALS — BP 131/69 | HR 74 | Temp 99.1°F | Resp 20

## 2024-08-12 DIAGNOSIS — J069 Acute upper respiratory infection, unspecified: Secondary | ICD-10-CM | POA: Diagnosis not present

## 2024-08-12 MED ORDER — AZELASTINE HCL 0.1 % NA SOLN: 1.0000 | Freq: Two times a day (BID) | NASAL | 0 refills | Status: AC

## 2024-08-12 MED ORDER — ALBUTEROL SULFATE HFA 108 (90 BASE) MCG/ACT IN AERS: 2.0000 | INHALATION_SPRAY | RESPIRATORY_TRACT | 0 refills | Status: AC | PRN

## 2024-08-12 MED ORDER — PROMETHAZINE-DM 6.25-15 MG/5ML PO SYRP: 5.0000 mL | ORAL_SOLUTION | Freq: Four times a day (QID) | ORAL | 0 refills | Status: AC | PRN

## 2024-08-12 NOTE — ED Triage Notes (Addendum)
 Pt reports nasal congestion, cough, headache since Friday. Intermittent chills since Saturday. Denies any known exposures. Has taken delsym and otc medication with minimal relief of symptoms.  Declined viral testing in triage.

## 2024-08-12 NOTE — ED Provider Notes (Signed)
 [facility]    CSN: [id number] Arrival date & time: 08/12/24  1031      History   Chief Complaint Chief Complaint  Patient presents with   Cough    Cough, congestion for 5 days - Entered by patient    HPI Keith Forbes is a 58 y.o. male.   Patient presenting today with 5-day history of nasal congestion, cough, sinus headache.  Had intermittent chills over the weekend but this has resolved since.  Denies fever, chest pain, shortness of breath, abdominal pain, vomiting, diarrhea.  No known history of chronic pulmonary disease.  So far trying Delsym and Mucinex with minimal relief.    Past Medical History:  Diagnosis Date   Bone lesion 05/05/2015   Cancer (HCC)    hodgkin lymphoma   GERD (gastroesophageal reflux disease)    Hernia    Hodgkin lymphoma (HCC) 09/11/2014   Hypercholesteremia    Hyperlipidemia    Lymphoma (HCC)    Pars defect of lumbar spine    bilateral 2013 (Dr. Alix)   Wears contact lenses     Patient Active Problem List   Diagnosis Date Noted   Thrombocytopenia 05/10/2017   Coronary artery calcification seen on CAT scan 05/16/2016   Quality of life palliative care encounter 05/16/2016   Abdominal pain 11/16/2015   Poor memory 11/16/2015   Bone lesion 05/05/2015   Personal history of DVT (deep vein thrombosis) 04/26/2015   Constipation, chronic 01/21/2015   Headache disorder 12/23/2014   Bronchitis 12/22/2014   Neutropenia 12/22/2014   Blister of skin without infection 11/25/2014   Weight loss 11/25/2014   Mucositis due to chemotherapy 10/28/2014   Leukopenia due to antineoplastic chemotherapy 10/07/2014   Elevated liver enzymes 10/07/2014   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   History of Hodgkin's lymphoma 09/11/2014   Pars defect of lumbar spine    Hypercholesteremia    Inguinal hernia 04/17/2011    Past Surgical History:  Procedure Laterality Date   CLOSED REDUCTION HAND FRACTURE     FINGER FRACTURE  SURGERY     LYMPH NODE BIOPSY  12/15   PORT-A-CATH REMOVAL N/A 04/27/2015   Procedure: REMOVAL PORT-A-CATH;  Surgeon: Jina Nephew, MD;  Location: [facility];  Service: General;  Laterality: N/A;   PORTACATH PLACEMENT Left 09/15/2014   Procedure: INSERTION PORT-A-CATH WITH ULTRA SOUND;  Surgeon: Elon Pacini, MD;  Location: [facility];  Service: General;  Laterality: Left;       Home Medications    Prior to Admission medications   Medication Sig Start Date End Date Taking? Authorizing Provider  albuterol  (VENTOLIN  HFA) 108 (90 Base) MCG/ACT inhaler Inhale 2 puffs into the lungs every 4 (four) hours as needed. 08/12/24  Yes Stuart Vernell Norris, PA-C  azelastine (ASTELIN) 0.1 % nasal spray Place 1 spray into both nostrils 2 (two) times daily. Use in each nostril as directed 08/12/24  Yes Stuart Vernell Norris, PA-C  promethazine -dextromethorphan (PROMETHAZINE -DM) 6.25-15 MG/5ML syrup Take 5 mLs by mouth 4 (four) times daily as needed. 08/12/24  Yes Stuart Vernell Norris, PA-C  aspirin EC 81 MG tablet Take 81 mg by mouth every other day.    [provider]  cholecalciferol (VITAMIN D) 1000 units tablet Take 2,000 Units by mouth daily.     [provider]  co-enzyme Q-10 30 MG capsule Take 30 mg by mouth daily.    [provider]  ezetimibe (ZETIA) 10 MG tablet Take 1 tablet (10 mg total) by  mouth daily. 07/07/24   Duanne Butler DASEN, MD  Multiple Vitamin (MULTIVITAMIN) tablet Take 1 tablet by mouth daily. occasional Patient not taking: Reported on 07/07/2024    [provider]  Omega-3 Fatty Acids (FISH OIL) 500 MG CAPS Take by mouth.    [provider]  rosuvastatin  (CRESTOR ) 20 MG tablet Take 1 tablet (20 mg total) by mouth daily. 08/09/24   Duanne Butler DASEN, MD    Family History Family History  Problem Relation Age of Onset   Hyperlipidemia Father    Heart disease Father    Cancer Paternal Aunt        breast ca   Colon  cancer Neg Hx     Social History Social History   Tobacco Use   Smoking status: Never   Smokeless tobacco: Never  Substance Use Topics   Alcohol use: Yes    Comment: once every 2-3 months or less   Drug use: No     Allergies   Patient has no known allergies.   Review of Systems Review of Systems Per HPI  Physical Exam Triage Vital Signs ED Triage Vitals  Encounter Vitals Group     BP 08/12/24 1044 131/69     Girls Systolic BP Percentile --      Girls Diastolic BP Percentile --      Boys Systolic BP Percentile --      Boys Diastolic BP Percentile --      Pulse Rate 08/12/24 1044 74     Resp 08/12/24 1044 20     Temp 08/12/24 1044 99.1 F (37.3 C)     Temp Source 08/12/24 1044 Oral     SpO2 08/12/24 1044 95 %     Weight --      Height --      Head Circumference --      Peak Flow --      Pain Score 08/12/24 1045 2     Pain Loc --      Pain Education --      Exclude from Growth Chart --    No data found.  Updated Vital Signs BP 131/69 (BP Location: Right Arm)   Pulse 74   Temp 99.1 F (37.3 C) (Oral)   Resp 20   SpO2 95%   Visual Acuity Right Eye Distance:   Left Eye Distance:   Bilateral Distance:    Right Eye Near:   Left Eye Near:    Bilateral Near:     Physical Exam Vitals and nursing note reviewed.  Constitutional:      Appearance: He is well-developed.  HENT:     Head: Atraumatic.     Right Ear: External ear normal.     Left Ear: External ear normal.     Nose: Rhinorrhea present.     Mouth/Throat:     Pharynx: Posterior oropharyngeal erythema present. No oropharyngeal exudate.  Eyes:     Conjunctiva/sclera: Conjunctivae normal.     Pupils: Pupils are equal, round, and reactive to light.  Cardiovascular:     Rate and Rhythm: Normal rate and regular rhythm.  Pulmonary:     Effort: Pulmonary effort is normal. No respiratory distress.     Breath sounds: No wheezing or rales.  Musculoskeletal:        General: Normal range of motion.      Cervical back: Normal range of motion and neck supple.  Lymphadenopathy:     Cervical: No cervical adenopathy.  Skin:  General: Skin is warm and dry.  Neurological:     Mental Status: He is alert and oriented to person, place, and time.  Psychiatric:        Behavior: Behavior normal.    UC Treatments / Results  Labs (all labs ordered are listed, but only abnormal results are displayed) Labs Reviewed - No data to display  EKG   Radiology No results found.  Procedures Procedures (including critical care time)  Medications Ordered in UC Medications - No data to display  Initial Impression / Assessment and Plan / UC Course  I have reviewed the triage vital signs and the nursing notes.  Pertinent labs & imaging results that were available during my care of the patient were reviewed by me and considered in my medical decision making (see chart for details).     Vital signs and exam very reassuring today, suspect viral respiratory infection.  Will treat with albuterol , Astelin, Phenergan  DM for symptomatic support.  Discussed supportive over-the-counter medications, home care and return precautions.  Work note given.  Final Clinical Impressions(s) / UC Diagnoses   Final diagnoses:  Viral URI with cough   Discharge Instructions   None    ED Prescriptions     Medication Sig Dispense Auth. Provider   albuterol  (VENTOLIN  HFA) 108 (90 Base) MCG/ACT inhaler Inhale 2 puffs into the lungs every 4 (four) hours as needed. 18 g Stuart Vernell Norris, PA-C   azelastine (ASTELIN) 0.1 % nasal spray Place 1 spray into both nostrils 2 (two) times daily. Use in each nostril as directed 30 mL Stuart Vernell Norris, PA-C   promethazine -dextromethorphan (PROMETHAZINE -DM) 6.25-15 MG/5ML syrup Take 5 mLs by mouth 4 (four) times daily as needed. 100 mL Stuart Vernell Norris, [place]      PDMP not reviewed this encounter.   Stuart Vernell Norris, [place] 08/12/24 1201

## 2025-07-06 ENCOUNTER — Other Ambulatory Visit

## 2025-07-09 ENCOUNTER — Encounter: Admitting: Family Medicine
